# Patient Record
Sex: Female | Born: 1995 | Race: Black or African American | Hispanic: No | Marital: Single | Smoking: Former smoker
Health system: Southern US, Community
[De-identification: ages and names within clinical notes are randomized; demographics above are authoritative.]

## PROBLEM LIST (undated history)

## (undated) DIAGNOSIS — I10 Essential (primary) hypertension: Secondary | ICD-10-CM

## (undated) DIAGNOSIS — E282 Polycystic ovarian syndrome: Secondary | ICD-10-CM

## (undated) DIAGNOSIS — G43909 Migraine, unspecified, not intractable, without status migrainosus: Secondary | ICD-10-CM

## (undated) DIAGNOSIS — Z8619 Personal history of other infectious and parasitic diseases: Secondary | ICD-10-CM

## (undated) HISTORY — PX: OTHER SURGICAL HISTORY: SHX169

## (undated) HISTORY — DX: Migraine, unspecified, not intractable, without status migrainosus: G43.909

## (undated) HISTORY — PX: DENTAL SURGERY: SHX609

## (undated) HISTORY — PX: GANGLION CYST EXCISION: SHX1691

---
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
## 2014-08-13 NOTE — Consult Note (Signed)
PATIENT NAME:  Kari Morales, UMBAUGH MR#:  [id number] DATE OF BIRTH:  08/10/1995  DATE OF CONSULTATION:  07/24/2011  REFERRING PHYSICIAN:   CONSULTING PHYSICIAN:  Carmie End, MD  PRIMARY CARE PHYSICIAN: Evelene Croon, MD    CHIEF COMPLAINT: Abdominal pain.   BRIEF HISTORY: Merrill Deanda is a 19 year old girl seen in the Emergency Room with a several day history of abdominal pain. She has crampy upper abdominal pain without radiation associated with mild nausea, no vomiting, and mild diarrhea. On close questioning, the pain dates back multiple months and she's had intermittent episodes but the current situation is probably the worst. She has been evaluated in Dr. Carron Brazen office on several occasions. No laboratory values are available for our review. She was originally set up for an upper endoscopy at [facility] but that appointment was canceled as it was felt that she likely had a variety of gastroenteritis. The symptoms have returned. The patient and family are frustrated and they came to the Emergency Room for further evaluation this evening.   She denies any history of hepatitis, yellow jaundice, pancreatitis, peptic ulcer disease, gallbladder disease, or previous abdominal surgery. She has intermittent menstrual bleeding which, over the last several months, has been abnormal. She has not been evaluated by a gynecologist as yet. She has no other major medical problems, specifically denying cardiac disease, hypertension, or diabetes. She does have a distant family history of sickle cell disease. Her mother recently had gallbladder surgery.   MEDICATIONS: She takes no medications regularly.   ALLERGIES: She is allergic to shellfish.   SOCIAL HISTORY: She is a high [employer]. Denies any alcohol or tobacco abuse. This pain has interrupted her schoolwork and she has missed multiple days from school.   REVIEW OF SYSTEMS: 10 point review of systems otherwise  unremarkable.     PHYSICAL EXAMINATION:   GENERAL: She is mildly hypertensive at 145/105, heart rate 80 and regular. She is afebrile. Oxygen saturation is satisfactory.   HEENT: Unremarkable. She has no scleral icterus. There are no facial deformities with equally round pupils.   NECK: Supple without adenopathy and her trachea is midline.   CHEST: Clear with normal pulmonary excursion. No adventitious sounds.   CARDIAC: No murmurs or gallops to my ear and she is in normal sinus rhythm.   ABDOMEN: Mildly distended, soft, with generalized tenderness in the upper abdomen and lower abdomen but no point tenderness. No rebound. No guarding. No hernias. No masses noted.   EXTREMITIES: Full range of motion. Good distal pulses. No deformities.   PSYCHIATRIC: Unremarkable with normal affect and normal orientation. However, I do get a hint from her that she's frustrated with her parents over this presentation and would like more to be done this evening.   The patient underwent a CT scan in the Emergency Room which demonstrated slightly thickened proximal small bowel without any inflammatory changes and a mildly dilated appendix at 6 mm; however, the appendix did have air in it, does not appear to be any periappendiceal stranding and no inflammatory changes noted. She does have multiple intraabdominal lymph nodes noted.   Laboratory values were largely unremarkable. Her liver function studies are slightly abnormal with a bilirubin of 1.2, white blood cell count 6400, hemoglobin 16. I have independently reviewed her CT scan.   I do not see any indication for urgent intervention. The most likely diagnosis is gastroenteritis but in view of her multiple symptoms and the persistent nature of this problem she  would likely need an upper endoscopy and a pediatric gastroenterology evaluation. With the abnormal CT scan suggesting some proximal small bowel thickening, I think upper endoscopy may be of some  benefit. The family would like to have an outside referral to a specialist and we will help arrange that if possible. I do not see any indication for hospitalization this evening but did offer observation to the family. They declined and are willing to take her home. Will discharge her home on Vicodin and Phenergan to help with her symptoms.  ____________________________ Quentin Orealph L. Ely III, MD rle:drc D: 07/24/2011 02:34:57 ET T: 07/24/2011 10:17:07 ET JOB#: [id number]  cc: Quentin Orealph L. Ely III, MD, <Dictator> Meindert A. Lacie ScottsNiemeyer, MD Quentin OreALPH L ELY MD ELECTRONICALLY SIGNED 08/01/2011 7:22

## 2015-01-10 ENCOUNTER — Encounter: Payer: Self-pay | Admitting: Emergency Medicine

## 2015-01-10 ENCOUNTER — Emergency Department
Admission: EM | Admit: 2015-01-10 | Discharge: 2015-01-10 | Disposition: A | Discharge status: Home / Self Care | Payer: BLUE CROSS/BLUE SHIELD | Attending: Emergency Medicine | Admitting: Emergency Medicine

## 2015-01-10 DIAGNOSIS — Z3202 Encounter for pregnancy test, result negative: Secondary | ICD-10-CM | POA: Insufficient documentation

## 2015-01-10 DIAGNOSIS — N730 Acute parametritis and pelvic cellulitis: Secondary | ICD-10-CM

## 2015-01-10 DIAGNOSIS — R102 Pelvic and perineal pain: Secondary | ICD-10-CM | POA: Diagnosis present

## 2015-01-10 DIAGNOSIS — N739 Female pelvic inflammatory disease, unspecified: Secondary | ICD-10-CM | POA: Diagnosis not present

## 2015-01-10 HISTORY — DX: Polycystic ovarian syndrome: E28.2

## 2015-01-10 LAB — URINALYSIS COMPLETE WITH MICROSCOPIC (ARMC ONLY)
Bilirubin Urine: NEGATIVE
Glucose, UA: NEGATIVE mg/dL
HGB URINE DIPSTICK: NEGATIVE
Ketones, ur: NEGATIVE mg/dL
NITRITE: NEGATIVE
PH: 6 (ref 5.0–8.0)
PROTEIN: NEGATIVE mg/dL
SPECIFIC GRAVITY, URINE: 1.012 (ref 1.005–1.030)

## 2015-01-10 LAB — WET PREP, GENITAL
Clue Cells Wet Prep HPF POC: NONE SEEN
TRICH WET PREP: NONE SEEN
YEAST WET PREP: NONE SEEN

## 2015-01-10 LAB — CHLAMYDIA/NGC RT PCR (ARMC ONLY)
Chlamydia Tr: NOT DETECTED
N gonorrhoeae: NOT DETECTED

## 2015-01-10 MED ORDER — DOXYCYCLINE HYCLATE 100 MG PO CAPS: 100.0000 mg | ORAL_CAPSULE | Freq: Two times a day (BID) | ORAL | Status: DC

## 2015-01-10 MED ORDER — AZITHROMYCIN 250 MG PO TABS
1000.0000 mg | ORAL_TABLET | Freq: Once | ORAL | Status: AC
  Administered 2015-01-10: 1000 mg via ORAL
  Filled 2015-01-10: qty 4

## 2015-01-10 MED ORDER — IBUPROFEN 800 MG PO TABS
800.0000 mg | ORAL_TABLET | Freq: Once | ORAL | Status: AC
  Administered 2015-01-10: 800 mg via ORAL
  Filled 2015-01-10: qty 1

## 2015-01-10 MED ORDER — TRAMADOL HCL 50 MG PO TABS: 50.0000 mg | ORAL_TABLET | Freq: Four times a day (QID) | ORAL | Status: AC | PRN

## 2015-01-10 MED ORDER — METRONIDAZOLE 500 MG PO TABS: 500.0000 mg | ORAL_TABLET | Freq: Two times a day (BID) | ORAL | Status: DC

## 2015-01-10 MED ORDER — CEFTRIAXONE SODIUM 250 MG IJ SOLR
250.0000 mg | INTRAMUSCULAR | Status: DC
  Administered 2015-01-10: 250 mg via INTRAMUSCULAR
  Filled 2015-01-10: qty 250

## 2015-01-10 NOTE — ED Notes (Signed)
POC negative. Glucometer recording not available at this time to input into system.

## 2015-01-10 NOTE — Discharge Instructions (Signed)
Pelvic Inflammatory Disease °Pelvic inflammatory disease (PID) refers to an infection in some or all of the female organs. The infection can be in the uterus, ovaries, fallopian tubes, or the surrounding tissues in the pelvis. PID can cause abdominal or pelvic pain that comes on suddenly (acute pelvic pain). PID is a serious infection because it can lead to lasting (chronic) pelvic pain or the inability to have children (infertile).  °CAUSES  °The infection is often caused by the normal bacteria found in the vaginal tissues. PID may also be caused by an infection that is spread during sexual contact. PID can also occur following:  °· The birth of a baby.   °· A miscarriage.   °· An abortion.   °· Major pelvic surgery.   °· The use of an intrauterine device (IUD).   °· A sexual assault.   °RISK FACTORS °Certain factors can put a person at higher risk for PID, such as: °· Being younger than 25 years. °· Being sexually active at a young age. °· Using nonbarrier contraception. °· Having multiple sexual partners. °· Having sex with someone who has symptoms of a genital infection. °· Using oral contraception. °Other times, certain behaviors can increase the possibility of getting PID, such as: °· Having sex during your period. °· Using a vaginal douche. °· Having an intrauterine device (IUD) in place. °SYMPTOMS  °· Abdominal or pelvic pain.   °· Fever.   °· Chills.   °· Abnormal vaginal discharge. °· Abnormal uterine bleeding.   °· Unusual pain shortly after finishing your period. °DIAGNOSIS  °Your caregiver will choose some of the following methods to make a diagnosis, such as:  °· Performing a physical exam and history. A pelvic exam typically reveals a very tender uterus and surrounding pelvis.   °· Ordering laboratory tests including a pregnancy test, blood tests, and urine test.  °· Ordering cultures of the vagina and cervix to check for a sexually transmitted infection (STI). °· Performing an ultrasound.    °· Performing a laparoscopic procedure to look inside the pelvis.   °TREATMENT  °· Antibiotic medicines may be prescribed and taken by mouth.   °· Sexual partners may be treated when the infection is caused by a sexually transmitted disease (STD).   °· Hospitalization may be needed to give antibiotics intravenously. °· Surgery may be needed, but this is rare. °It may take weeks until you are completely well. If you are diagnosed with PID, you should also be checked for human immunodeficiency virus (HIV).   °HOME CARE INSTRUCTIONS  °· If given, take your antibiotics as directed. Finish the medicine even if you start to feel better.   °· Only take over-the-counter or prescription medicines for pain, discomfort, or fever as directed by your caregiver.   °· Do not have sexual intercourse until treatment is completed or as directed by your caregiver. If PID is confirmed, your recent sexual partner(s) will need treatment.   °· Keep your follow-up appointments. °SEEK MEDICAL CARE IF:  °· You have increased or abnormal vaginal discharge.   °· You need prescription medicine for your pain.   °· You vomit.   °· You cannot take your medicines.   °· Your partner has an STD.   °SEEK IMMEDIATE MEDICAL CARE IF:  °· You have a fever.   °· You have increased abdominal or pelvic pain.   °· You have chills.   °· You have pain when you urinate.   °· You are not better after 72 hours following treatment.   °MAKE SURE YOU:  °· Understand these instructions. °· Will watch your condition. °· Will get help right away if you are not doing well or get worse. °  Document Released: 04/07/2005 Document Revised: 08/02/2012 Document Reviewed: 04/03/2011 °Exit[employer]® Patient Information ©2015 [employer]. This information is not intended to replace advice given to you by your health care provider. Make sure you discuss any questions you have with your health care provider. ° °

## 2015-01-10 NOTE — ED Notes (Signed)
Patient to ER for c/o flank pain and pelvic pain bilaterally. States a couple days ago developed left flank pain, now worse on right. Has h/o PCOS. States her period was 5 days late. POC pregnancy test in triage today was negative. Reports frequent urination, but no dysuria.

## 2015-01-10 NOTE — ED Provider Notes (Signed)
[facility] Emergency Department Provider Note     Time seen: ----------------------------------------- 12:11 PM on 01/10/2015 -----------------------------------------    I have reviewed the triage vital signs and the nursing notes.   HISTORY  Chief Complaint Flank Pain and Pelvic Pain    HPI Kari Morales is a 19 y.o. female who presents ER for pelvic pain. Patient states it hurts bilaterally, she's had vaginal discharge. Also has right flank pain. Doesn't history of polycystic ovaries, states her period was 5 days late. Her triage (test was negative. Reports frequent urination but no pain.   Past Medical History  Diagnosis Date  . PCOS (polycystic ovarian syndrome)     There are no active problems to display for this patient.   Past Surgical History  Procedure Laterality Date  . Ganglion cyst excision Left     x2    Allergies Shrimp  Social History Social History  Substance Use Topics  . Smoking status: Never Smoker   . Smokeless tobacco: None  . Alcohol Use: No    Review of Systems Constitutional: Negative for fever. Eyes: Negative for visual changes. ENT: Negative for sore throat. Cardiovascular: Negative for chest pain. Respiratory: Negative for shortness of breath. Gastrointestinal: Negative for abdominal pain, vomiting and diarrhea. Genitourinary: positive for pelvic pain, vaginal discharge Musculoskeletal: positive for right-sided low back pain Skin: Negative for rash. Neurological: Negative for headaches, focal weakness or numbness.  10-point ROS otherwise negative.  ____________________________________________   PHYSICAL EXAM:  VITAL SIGNS: ED Triage Vitals  Enc Vitals Group     BP 01/10/15 1111 158/111 mmHg     Pulse Rate 01/10/15 1111 100     Resp 01/10/15 1111 20     Temp 01/10/15 1111 98.2 F (36.8 C)     Temp Source 01/10/15 1111 Oral     SpO2 01/10/15 1111 100 %     Weight 01/10/15 1111 183 lb  (83.008 kg)     Height 01/10/15 1111  (1.626 m)     Head Cir --      Peak Flow --      Pain Score 01/10/15 1130 6     Pain Loc --      Pain Edu? --      Excl. in GC? --     Constitutional: Alert and oriented. Well appearing and in no distress. Eyes: Conjunctivae are normal. PERRL. Normal extraocular movements. ENT   Head: Normocephalic and atraumatic.   Nose: No congestion/rhinnorhea.   Mouth/Throat: Mucous membranes are moist.   Neck: No stridor. Cardiovascular: Normal rate, regular rhythm. Normal and symmetric distal pulses are present in all extremities. No murmurs, rubs, or gallops. Respiratory: Normal respiratory effort without tachypnea nor retractions. Breath sounds are clear and equal bilaterally. No wheezes/rales/rhonchi. Gastrointestinal: mild suprapubic tenderness, no rebound or guarding. Normal bowel sounds Musculoskeletal: Nontender with normal range of motion in all extremities. No joint effusions.  No lower extremity tenderness nor edema. Neurologic:  Normal speech and language. No gross focal neurologic deficits are appreciated. Speech is normal. No gait instability. Skin:  Skin is warm, dry and intact. No rash noted. Psychiatric: Mood and affect are normal. Speech and behavior are normal. Patient exhibits appropriate insight and judgment. ____________________________________________  ED COURSE:  Pertinent labs & imaging results that were available during my care of the patient were reviewed by me and considered in my medical decision making (see chart for details). Patient's in no acute distress, will check pelvic exam and urinalysis.  Or cervical  motion tenderness with significant vaginal discharge that is white with a yellow tinge. ____________________________________________    LABS (pertinent positives/negatives)  Labs Reviewed  WET PREP, GENITAL - Abnormal; Notable for the following:    WBC, Wet Prep HPF POC MODERATE (*)    All other  components within normal limits  URINALYSIS COMPLETEWITH MICROSCOPIC ([facility] ONLY) - Abnormal; Notable for the following:    Color, Urine YELLOW (*)    APPearance HAZY (*)    Leukocytes, UA TRACE (*)    Bacteria, UA RARE (*)    Squamous Epithelial / LPF 6-30 (*)    All other components within normal limits  CHLAMYDIA/NGC RT PCR ([facility] ONLY)  POC URINE PREG, ED    ____________________________________________  FINAL ASSESSMENT AND PLAN  Pelvic pain, PID  Plan: Patient with labs as dictated above. Patient is advised her symptoms are consistent with PID. She has copious vaginal dischargeand was given Rocephin and Zithromax to cover her for STDs. We'll discharge with doxycycline and Flagyl. She also received Motrin for pain.   Emily Filbert, MD   Emily Filbert, MD 01/10/15 [id number]

## 2015-01-10 NOTE — ED Notes (Signed)
Pt informed to return if any life threatening symptoms occur.  

## 2016-04-15 ENCOUNTER — Emergency Department (HOSPITAL_COMMUNITY)
Admission: EM | Admit: 2016-04-15 | Discharge: 2016-04-15 | Disposition: A | Discharge status: Home / Self Care | Payer: BLUE CROSS/BLUE SHIELD | Attending: Emergency Medicine | Admitting: Emergency Medicine

## 2016-04-15 ENCOUNTER — Emergency Department
Admission: EM | Admit: 2016-04-15 | Discharge: 2016-04-15 | Disposition: A | Discharge status: Home / Self Care | Payer: BLUE CROSS/BLUE SHIELD | Attending: Emergency Medicine | Admitting: Emergency Medicine

## 2016-04-15 ENCOUNTER — Encounter (HOSPITAL_COMMUNITY): Payer: Self-pay | Admitting: Emergency Medicine

## 2016-04-15 DIAGNOSIS — Z79899 Other long term (current) drug therapy: Secondary | ICD-10-CM | POA: Insufficient documentation

## 2016-04-15 DIAGNOSIS — I10 Essential (primary) hypertension: Secondary | ICD-10-CM | POA: Insufficient documentation

## 2016-04-15 DIAGNOSIS — R1032 Left lower quadrant pain: Secondary | ICD-10-CM | POA: Insufficient documentation

## 2016-04-15 DIAGNOSIS — Z5321 Procedure and treatment not carried out due to patient leaving prior to being seen by health care provider: Secondary | ICD-10-CM | POA: Diagnosis not present

## 2016-04-15 DIAGNOSIS — K6289 Other specified diseases of anus and rectum: Secondary | ICD-10-CM | POA: Insufficient documentation

## 2016-04-15 DIAGNOSIS — R1031 Right lower quadrant pain: Secondary | ICD-10-CM | POA: Insufficient documentation

## 2016-04-15 DIAGNOSIS — R102 Pelvic and perineal pain: Secondary | ICD-10-CM

## 2016-04-15 DIAGNOSIS — R1030 Lower abdominal pain, unspecified: Secondary | ICD-10-CM | POA: Diagnosis present

## 2016-04-15 HISTORY — DX: Essential (primary) hypertension: I10

## 2016-04-15 LAB — CBC WITH DIFFERENTIAL/PLATELET
Basophils Absolute: 0 10*3/uL (ref 0.0–0.1)
Basophils Relative: 0 %
Eosinophils Absolute: 0.1 10*3/uL (ref 0.0–0.7)
Eosinophils Relative: 1 %
HCT: 41.6 % (ref 36.0–46.0)
Hemoglobin: 14.7 g/dL (ref 12.0–15.0)
Lymphocytes Relative: 28 %
Lymphs Abs: 2.5 10*3/uL (ref 0.7–4.0)
MCH: 27.3 pg (ref 26.0–34.0)
MCHC: 35.3 g/dL (ref 30.0–36.0)
MCV: 77.2 fL — ABNORMAL LOW (ref 78.0–100.0)
Monocytes Absolute: 0.7 10*3/uL (ref 0.1–1.0)
Monocytes Relative: 9 %
Neutro Abs: 5.4 10*3/uL (ref 1.7–7.7)
Neutrophils Relative %: 62 %
Platelets: 193 10*3/uL (ref 150–400)
RBC: 5.39 MIL/uL — ABNORMAL HIGH (ref 3.87–5.11)
RDW: 12.4 % (ref 11.5–15.5)
WBC: 8.7 10*3/uL (ref 4.0–10.5)

## 2016-04-15 LAB — URINALYSIS, ROUTINE W REFLEX MICROSCOPIC
Bilirubin Urine: NEGATIVE
Glucose, UA: NEGATIVE mg/dL
Hgb urine dipstick: NEGATIVE
Ketones, ur: NEGATIVE mg/dL
Leukocytes, UA: NEGATIVE
Nitrite: NEGATIVE
Protein, ur: NEGATIVE mg/dL
Specific Gravity, Urine: 1.011 (ref 1.005–1.030)
pH: 7 (ref 5.0–8.0)

## 2016-04-15 LAB — COMPREHENSIVE METABOLIC PANEL
ALT: 16 U/L (ref 14–54)
AST: 28 U/L (ref 15–41)
Albumin: 4.3 g/dL (ref 3.5–5.0)
Alkaline Phosphatase: 55 U/L (ref 38–126)
Anion gap: 12 (ref 5–15)
BUN: 9 mg/dL (ref 6–20)
CO2: 25 mmol/L (ref 22–32)
Calcium: 9.8 mg/dL (ref 8.9–10.3)
Chloride: 99 mmol/L — ABNORMAL LOW (ref 101–111)
Creatinine, Ser: 1.14 mg/dL — ABNORMAL HIGH (ref 0.44–1.00)
GFR calc Af Amer: >60 mL/min (ref >60)
GFR calc non Af Amer: >60 mL/min (ref >60)
Glucose, Bld: 93 mg/dL (ref 65–99)
Potassium: 3.4 mmol/L — ABNORMAL LOW (ref 3.5–5.1)
Sodium: 136 mmol/L (ref 135–145)
Total Bilirubin: 1 mg/dL (ref 0.3–1.2)
Total Protein: 7.6 g/dL (ref 6.5–8.1)

## 2016-04-15 LAB — LIPASE, BLOOD: Lipase: 22 U/L (ref 11–51)

## 2016-04-15 LAB — PREGNANCY, URINE: Preg Test, Ur: NEGATIVE

## 2016-04-15 MED ORDER — SODIUM CHLORIDE 0.9 % IV BOLUS (SEPSIS)
1000.0000 mL | Freq: Once | INTRAVENOUS | Status: AC
  Administered 2016-04-15: 1000 mL via INTRAVENOUS

## 2016-04-15 MED ORDER — DOXYCYCLINE HYCLATE 100 MG PO CAPS: 100.0000 mg | ORAL_CAPSULE | Freq: Two times a day (BID) | ORAL | 0 refills | Status: DC

## 2016-04-15 MED ORDER — DICYCLOMINE HCL 10 MG/ML IM SOLN
20.0000 mg | Freq: Once | INTRAMUSCULAR | Status: AC
  Administered 2016-04-15: 20 mg via INTRAMUSCULAR
  Filled 2016-04-15: qty 2

## 2016-04-15 MED ORDER — KETOROLAC TROMETHAMINE 15 MG/ML IJ SOLN
15.0000 mg | Freq: Once | INTRAMUSCULAR | Status: AC
  Administered 2016-04-15: 15 mg via INTRAVENOUS
  Filled 2016-04-15: qty 1

## 2016-04-15 MED ORDER — CEFTRIAXONE SODIUM 250 MG IJ SOLR
250.0000 mg | Freq: Once | INTRAMUSCULAR | Status: AC
  Administered 2016-04-15: 250 mg via INTRAMUSCULAR
  Filled 2016-04-15: qty 250

## 2016-04-15 MED ORDER — LIDOCAINE HCL (PF) 1 % IJ SOLN
INTRAMUSCULAR | Status: AC
  Administered 2016-04-15: 5 mL
  Filled 2016-04-15: qty 5

## 2016-04-15 MED ORDER — TRAMADOL HCL 50 MG PO TABS: 50.0000 mg | ORAL_TABLET | Freq: Four times a day (QID) | ORAL | 0 refills | Status: DC | PRN

## 2016-04-15 MED ORDER — AZITHROMYCIN 250 MG PO TABS
1000.0000 mg | ORAL_TABLET | Freq: Once | ORAL | Status: AC
  Administered 2016-04-15: 1000 mg via ORAL
  Filled 2016-04-15: qty 4

## 2016-04-15 NOTE — ED Notes (Signed)
Papers reviewed with patient and medications and she verbalizes understanding

## 2016-04-15 NOTE — ED Triage Notes (Signed)
Patient reports rectal p[ain onset 2 am this morning , denies injury , no bleeding or constipation .

## 2016-04-15 NOTE — ED Notes (Signed)
ED Provider at bedside. 

## 2016-04-15 NOTE — ED Provider Notes (Signed)
[facility]-EMERGENCY DEPT Provider Note   CSN: [id number] Arrival date & time: 04/15/16  0606     History   Chief Complaint Chief Complaint  Patient presents with  . Rectal Pain    HPI Kari Morales is a 20 y.o. female.  HPI   20yF with abdominal pain. Acute onset around 0200 today. Waxes and wanes. Suprapubic pain. Sometimes radiating pain to rectum. No appreciable exacerbating or relieving factors. Thought she may be constipation so she tried a laxative with having a BM or improvement of symptoms. Reports last BM yesterday. +dysuria. +vaginal discharge but this has been going on for at least weeks and she says was told be GYN that this was normal. NO n/v. No fever or chills. Denies past abdomina/palvic surgery.   Past Medical History:  Diagnosis Date  . Hypertension   . PCOS (polycystic ovarian syndrome)     There are no active problems to display for this patient.   Past Surgical History:  Procedure Laterality Date  . GANGLION CYST EXCISION Left    x2    OB History    No data available       Home Medications    Prior to Admission medications   Medication Sig Start Date End Date Taking? Authorizing Provider  doxycycline (VIBRAMYCIN) 100 MG capsule Take 1 capsule (100 mg total) by mouth 2 (two) times daily. 01/10/15   Emily FilbertJonathan E Williams, MD  metroNIDAZOLE (FLAGYL) 500 MG tablet Take 1 tablet (500 mg total) by mouth 2 (two) times daily. 01/10/15   Emily FilbertJonathan E Williams, MD    Family History No family history on file.  Social History Social History  Substance Use Topics  . Smoking status: Never Smoker  . Smokeless tobacco: Never Used  . Alcohol use No     Allergies   Shrimp [shellfish allergy]   Review of Systems Review of Systems  All systems reviewed and negative, other than as noted in HPI.  Physical Exam Updated Vital Signs BP 132/87 (BP Location: Right Arm)   Pulse 105   Temp 98.1 F (36.7 C) (Oral)   Resp 18   Ht 5\' 4"  (1.626 m)   Wt 173 lb  6 oz (78.6 kg)   LMP 02/24/2016 (Approximate)   SpO2 100%   BMI 29.76 kg/m   Physical Exam  Constitutional: She appears well-developed and well-nourished. No distress.  HENT:  Head: Normocephalic and atraumatic.  Eyes: Conjunctivae are normal. Right eye exhibits no discharge. Left eye exhibits no discharge.  Neck: Neck supple.  Cardiovascular: Normal rate, regular rhythm and normal heart sounds.  Exam reveals no gallop and no friction rub.   No murmur heard. Pulmonary/Chest: Effort normal and breath sounds normal. No respiratory distress.  Abdominal: Soft. She exhibits no distension. There is no tenderness.  Genitourinary:  Genitourinary Comments: Chaperone present. Normal external genitalia. No concerning lesions noted. Moderate white discharge. +CMT.    Musculoskeletal: She exhibits no edema or tenderness.  Neurological: She is alert.  Skin: Skin is warm and dry.  Psychiatric: She has a normal mood and affect. Her behavior is normal. Thought content normal.  Nursing note and vitals reviewed.    ED Treatments / Results  Labs (all labs ordered are listed, but only abnormal results are displayed) Labs Reviewed  COMPREHENSIVE METABOLIC PANEL - Abnormal; Notable for the following:       Result Value   Potassium 3.4 (*)    Chloride 99 (*)    Creatinine, Ser 1.14 (*)  All other components within normal limits  CBC WITH DIFFERENTIAL/PLATELET - Abnormal; Notable for the following:    RBC 5.39 (*)    MCV 77.2 (*)    All other components within normal limits  PREGNANCY, URINE  URINALYSIS, ROUTINE W REFLEX MICROSCOPIC  LIPASE, BLOOD  GC/CHLAMYDIA PROBE AMP ([place]    EKG  EKG Interpretation None       Radiology No results found.  Procedures Procedures (including critical care time)  Medications Ordered in ED Medications  sodium chloride 0.9 % bolus 1,000 mL (not administered)  dicyclomine (BENTYL) injection 20 mg (not administered)  ketorolac  (TORADOL) 15 MG/ML injection 15 mg (not administered)     Initial Impression / Assessment and Plan / ED Course  I have reviewed the triage vital signs and the nursing notes.  Pertinent labs & imaging results that were available during my care of the patient were reviewed by me and considered in my medical decision making (see chart for details).  Clinical Course     20yf with lower abdomina/pelvic pain. Concerning for possible PID. No peritonitis. Afebrile. Nontoxic. HD stable. I feel appropriate for outpt tx. Doubt TOA or abdominal surgical emergency.   Final Clinical Impressions(s) / ED Diagnoses   Final diagnoses:  Pelvic pain    New Prescriptions Discharge Medication List as of 04/15/2016 10:03 AM    START taking these medications   Details  traMADol (ULTRAM) 50 MG tablet Take 1 tablet (50 mg total) by mouth every 6 (six) hours as needed., Starting Tue 04/15/2016, Print         Raeford RazorStephen Hilarie Sinha, MD 04/16/16 [phone]

## 2016-04-15 NOTE — ED Notes (Signed)
Pt & SO noted leaving ED lobby 

## 2016-04-15 NOTE — ED Triage Notes (Addendum)
Pt presents to ED with c/o rectal pain and RLQ and LLQ suprapubic abdominal pain that started at 2am this morning. Pt reports (+) nausea, but no vomiting or diarrhea. Pt reports missing her LMP this month d/t "taking metformin". Pt denies change in bowel habits, but reports painful urination with increased frequency. Pt denies any h/x of hemorrhoids, denies rectal bleeding. Pt reports last BM was "like a day ago". Pt appears very uncomfortable, states she is unable to sit d/t discomfort, and is holding her lower abdomen with one hand and her rectum with the other..Marland Kitchen

## 2016-04-16 LAB — GC/CHLAMYDIA PROBE AMP (~~LOC~~) NOT AT ARMC
Chlamydia: NEGATIVE
Neisseria Gonorrhea: NEGATIVE

## 2016-05-08 ENCOUNTER — Emergency Department (HOSPITAL_COMMUNITY)
Admission: EM | Admit: 2016-05-08 | Discharge: 2016-05-09 | Disposition: A | Discharge status: Home / Self Care | Payer: BLUE CROSS/BLUE SHIELD | Attending: Emergency Medicine | Admitting: Emergency Medicine

## 2016-05-08 ENCOUNTER — Emergency Department (HOSPITAL_COMMUNITY): Payer: BLUE CROSS/BLUE SHIELD

## 2016-05-08 ENCOUNTER — Encounter (HOSPITAL_COMMUNITY): Payer: Self-pay

## 2016-05-08 DIAGNOSIS — Y929 Unspecified place or not applicable: Secondary | ICD-10-CM | POA: Insufficient documentation

## 2016-05-08 DIAGNOSIS — Z79899 Other long term (current) drug therapy: Secondary | ICD-10-CM | POA: Diagnosis not present

## 2016-05-08 DIAGNOSIS — I10 Essential (primary) hypertension: Secondary | ICD-10-CM | POA: Diagnosis not present

## 2016-05-08 DIAGNOSIS — E860 Dehydration: Secondary | ICD-10-CM

## 2016-05-08 DIAGNOSIS — Y999 Unspecified external cause status: Secondary | ICD-10-CM | POA: Diagnosis not present

## 2016-05-08 DIAGNOSIS — W19XXXA Unspecified fall, initial encounter: Secondary | ICD-10-CM

## 2016-05-08 DIAGNOSIS — Z711 Person with feared health complaint in whom no diagnosis is made: Secondary | ICD-10-CM

## 2016-05-08 DIAGNOSIS — W010XXA Fall on same level from slipping, tripping and stumbling without subsequent striking against object, initial encounter: Secondary | ICD-10-CM | POA: Insufficient documentation

## 2016-05-08 DIAGNOSIS — R7989 Other specified abnormal findings of blood chemistry: Secondary | ICD-10-CM

## 2016-05-08 DIAGNOSIS — R1030 Lower abdominal pain, unspecified: Secondary | ICD-10-CM

## 2016-05-08 DIAGNOSIS — M545 Low back pain: Secondary | ICD-10-CM | POA: Insufficient documentation

## 2016-05-08 DIAGNOSIS — Y9301 Activity, walking, marching and hiking: Secondary | ICD-10-CM | POA: Diagnosis not present

## 2016-05-08 DIAGNOSIS — Z202 Contact with and (suspected) exposure to infections with a predominantly sexual mode of transmission: Secondary | ICD-10-CM | POA: Diagnosis not present

## 2016-05-08 LAB — CBC WITH DIFFERENTIAL/PLATELET
BASOS ABS: 0 10*3/uL (ref 0.0–0.1)
Basophils Relative: 0 %
EOS PCT: 0 %
Eosinophils Absolute: 0 10*3/uL (ref 0.0–0.7)
HCT: 45.2 % (ref 36.0–46.0)
Hemoglobin: 15.8 g/dL — ABNORMAL HIGH (ref 12.0–15.0)
LYMPHS ABS: 1.7 10*3/uL (ref 0.7–4.0)
LYMPHS PCT: 17 %
MCH: 27.4 pg (ref 26.0–34.0)
MCHC: 35 g/dL (ref 30.0–36.0)
MCV: 78.5 fL (ref 78.0–100.0)
Monocytes Absolute: 0.7 10*3/uL (ref 0.1–1.0)
Monocytes Relative: 7 %
Neutro Abs: 7.6 10*3/uL (ref 1.7–7.7)
Neutrophils Relative %: 76 %
PLATELETS: 211 10*3/uL (ref 150–400)
RBC: 5.76 MIL/uL — ABNORMAL HIGH (ref 3.87–5.11)
RDW: 12.8 % (ref 11.5–15.5)
WBC: 10.1 10*3/uL (ref 4.0–10.5)

## 2016-05-08 LAB — BASIC METABOLIC PANEL
Anion gap: 18 — ABNORMAL HIGH (ref 5–15)
BUN: 9 mg/dL (ref 6–20)
CO2: 22 mmol/L (ref 22–32)
Calcium: 10.3 mg/dL (ref 8.9–10.3)
Chloride: 97 mmol/L — ABNORMAL LOW (ref 101–111)
Creatinine, Ser: 1.24 mg/dL — ABNORMAL HIGH (ref 0.44–1.00)
GFR calc Af Amer: >60 mL/min (ref >60)
GLUCOSE: 74 mg/dL (ref 65–99)
POTASSIUM: 3.3 mmol/L — AB (ref 3.5–5.1)
Sodium: 137 mmol/L (ref 135–145)

## 2016-05-08 LAB — POC URINE PREG, ED: PREG TEST UR: NEGATIVE

## 2016-05-08 MED ORDER — HYDROCODONE-ACETAMINOPHEN 5-325 MG PO TABS
2.0000 | ORAL_TABLET | Freq: Once | ORAL | Status: AC
  Administered 2016-05-08: 2 via ORAL
  Filled 2016-05-08: qty 2

## 2016-05-08 NOTE — ED Provider Notes (Signed)
[facility]-EMERGENCY DEPT Provider Note   CSN: [id number] Arrival date & time: 05/08/16  1938     History   Chief Complaint Chief Complaint  Patient presents with  . Fall  . Possible Pregnancy    HPI Kari Morales is a 21 y.o. female.  Kari Morales is a 21 y.o. Female who presents to the emergency department complaining of a slip and fall on the ice today with some back and neck pain as well as some vaginal bleeding and lower abdominal pain starting today. Patient reports she was walking in the snow when she slipped and fell landing on her back. She complains of some pain to her neck and back. She denies cigarette or loss of consciousness. She reports after the fall she began having some bilateral lower abdominal pain that is worse on her right side and vaginal bleeding. She is requesting a pregnancy test. She is sexually active and does not use protection.  She reports the pain in her abdomen came on suddenly. Last menstrual cycle was 04/19/2016. Patient tells me she is feeling anxious about being in the hospital. Patient denies fevers, and, chest pain, shortness of breath, syncope, lightheadedness, dizziness, numbness, tingling, weakness, changes to her vision, urinary symptoms, nausea, vomiting or diarrhea.   The history is provided by the patient. No language interpreter was used.  Fall  Associated symptoms include abdominal pain. Pertinent negatives include no chest pain, no headaches and no shortness of breath.  Possible Pregnancy  Associated symptoms include abdominal pain. Pertinent negatives include no chest pain, no headaches and no shortness of breath.    Past Medical History:  Diagnosis Date  . Hypertension   . PCOS (polycystic ovarian syndrome)     There are no active problems to display for this patient.   Past Surgical History:  Procedure Laterality Date  . GANGLION CYST EXCISION Left    x2    OB History    No data available       Home Medications     Prior to Admission medications   Medication Sig Start Date End Date Taking? Authorizing Provider  acetaminophen (TYLENOL) 325 MG tablet Take 2 tablets (650 mg total) by mouth every 6 (six) hours as needed for mild pain or moderate pain. 05/09/16   Everlene Farrier, PA-C  doxycycline (VIBRAMYCIN) 100 MG capsule Take 1 capsule (100 mg total) by mouth 2 (two) times daily. 04/15/16   Raeford Razor, MD  metroNIDAZOLE (FLAGYL) 500 MG tablet Take 1 tablet (500 mg total) by mouth 2 (two) times daily. 05/09/16   Everlene Farrier, PA-C  traMADol (ULTRAM) 50 MG tablet Take 1 tablet (50 mg total) by mouth every 6 (six) hours as needed. 04/15/16   Raeford Razor, MD    Family History No family history on file.  Social History Social History  Substance Use Topics  . Smoking status: Never Smoker  . Smokeless tobacco: Never Used  . Alcohol use No     Allergies   Shrimp [shellfish allergy]   Review of Systems Review of Systems  Constitutional: Negative for chills and fever.  HENT: Negative for congestion, nosebleeds and sore throat.   Eyes: Negative for visual disturbance.  Respiratory: Negative for cough and shortness of breath.   Cardiovascular: Negative for chest pain and palpitations.  Gastrointestinal: Positive for abdominal pain. Negative for blood in stool, diarrhea, nausea and vomiting.  Genitourinary: Positive for pelvic pain and vaginal bleeding. Negative for difficulty urinating, dysuria, frequency, hematuria, vaginal discharge and vaginal pain.  Musculoskeletal: Positive for back pain and neck pain.  Skin: Negative for rash.  Neurological: Negative for weakness, light-headedness, numbness and headaches.     Physical Exam Updated Vital Signs BP 134/85   Pulse 94   Temp 98.1 F (36.7 C)   Resp 18   Ht 5\' 4"  (1.626 m)   Wt 75.8 kg   LMP 04/19/2016 (Exact Date)   SpO2 100%   BMI 28.67 kg/m   Physical Exam  Constitutional: She is oriented to person, place, and time. She  appears well-developed and well-nourished. No distress.  Nontoxic appearing.  HENT:  Head: Normocephalic and atraumatic.  Right Ear: External ear normal.  Left Ear: External ear normal.  Mouth/Throat: Oropharynx is clear and moist.  No visible or palpated signs of head injury or trauma.  Eyes: Conjunctivae are normal. Pupils are equal, round, and reactive to light. Right eye exhibits no discharge. Left eye exhibits no discharge.  Neck: Normal range of motion. Neck supple. No JVD present. No tracheal deviation present.  No midline neck tenderness.  Cardiovascular: Normal rate, regular rhythm, normal heart sounds and intact distal pulses.  Exam reveals no gallop and no friction rub.   No murmur heard. Heart rate is 96.  Pulmonary/Chest: Effort normal and breath sounds normal. No stridor. No respiratory distress. She has no wheezes. She has no rales. She exhibits no tenderness.  Lungs clear to auscultation bilaterally. Symmetric chest expansion bilaterally.  Abdominal: Soft. Bowel sounds are normal. She exhibits no distension and no mass. There is tenderness. There is no rebound and no guarding.  Mild suprapubic and right lower quadrant abdominal tenderness to palpation. No peritoneal signs. No rebound tenderness. No CVA or flank tenderness.  Genitourinary:  Genitourinary Comments: Pelvic exam performed by me with female nurse tech chaperone. No external lesions or rashes noted. No vaginal bleeding noted. Cervix is closed. No cervical motion tenderness. Mild left adnexal tenderness. No right adnexal tenderness.  Musculoskeletal: Normal range of motion. She exhibits tenderness. She exhibits no edema or deformity.  Mild tenderness to her right low back musculature. No midline neck or back tenderness. No back erythema, deformity, ecchymosis or warmth. Good strength to her bilateral upper and lower extremities. Normal gait.  Lymphadenopathy:    She has no cervical adenopathy.  Neurological: She is  alert and oriented to person, place, and time. She displays normal reflexes. No cranial nerve deficit or sensory deficit. Coordination normal.  Normal gait. Bilateral patellar DTRs are intact.  Skin: Skin is warm and dry. Capillary refill takes less than 2 seconds. No rash noted. She is not diaphoretic. No erythema. No pallor.  Psychiatric: She has a normal mood and affect. Her behavior is normal.  Nursing note and vitals reviewed.    ED Treatments / Results  Labs (all labs ordered are listed, but only abnormal results are displayed) Labs Reviewed  WET PREP, GENITAL - Abnormal; Notable for the following:       Result Value   Clue Cells Wet Prep HPF POC PRESENT (*)    WBC, Wet Prep HPF POC MANY (*)    All other components within normal limits  BASIC METABOLIC PANEL - Abnormal; Notable for the following:    Potassium 3.3 (*)    Chloride 97 (*)    Creatinine, Ser 1.24 (*)    Anion gap 18 (*)    All other components within normal limits  CBC WITH DIFFERENTIAL/PLATELET - Abnormal; Notable for the following:    RBC 5.76 (*)  Hemoglobin 15.8 (*)    All other components within normal limits  POC URINE PREG, ED  GC/CHLAMYDIA PROBE AMP ([place]) NOT AT [facility]    EKG  EKG Interpretation None       Radiology US Transvaginal Non-ob  Result Date: 05/08/2016 CLINICAL DATA:  Lower abdominal/ pelvic pain.  Vaginal bleeding. EXAM: TRANSABDOMINAL AND TRANSVAGINAL ULTRASOUND OF PELVIS DOPPLER ULTRASOUND OF OVARIES TECHNIQUE: Both transabdominal and transvaginal ultrasound examinations of the pelvis were performed. Transabdominal technique was performed for global imaging of the pelvis including uterus, ovaries, adnexal regions, and pelvic cul-de-sac. It was necessary to proceed with endovaginal exam following the transabdominal exam to visualize the right and left ovary. Color and duplex Doppler ultrasound was utilized to evaluate blood flow to the ovaries. COMPARISON:  None. FINDINGS:  Uterus Measurements: 7.2 x 3.9 x 4.9 cm when measured transabdominally. No fibroids or other mass visualized. Endometrium Thickness: 3 mm.  No focal abnormality visualized. Right ovary Measurements: 4.5 x 1.9 x 3.5 cm. Increased number of follicles that are oriented peripherally. Mildly echogenic ovarian stroma. Normal blood flow. Left ovary Measurements: 3.3 x 2.5 x 3.1 cm. Increased number of follicles that are oriented peripherally, relatively uniform in size. There is a complex cyst measuring 1.3 x 1.2 x 1.0 cm with internal septation and lacy internal echoes. Normal blood flow to the ovary. Pulsed Doppler evaluation of both ovaries demonstrates normal low-resistance arterial and venous waveforms. Other findings No abnormal free fluid.  Trace free fluid is physiologic. IMPRESSION: 1. Normal blood flow to both ovaries without torsion. 2. Sonographic findings support the patient's diagnosis of polycystic ovarian syndrome with peripherally oriented follicles in both ovaries. Probable small hemorrhagic cyst in the left ovary, no dedicated imaging follow-up is needed. 3. Normal sonographic appearance of the uterus. Electronically Signed   By: Rubye Oaks M.D.   On: 05/08/2016 23:55   US Pelvis Complete  Result Date: 05/08/2016 CLINICAL DATA:  Lower abdominal/ pelvic pain.  Vaginal bleeding. EXAM: TRANSABDOMINAL AND TRANSVAGINAL ULTRASOUND OF PELVIS DOPPLER ULTRASOUND OF OVARIES TECHNIQUE: Both transabdominal and transvaginal ultrasound examinations of the pelvis were performed. Transabdominal technique was performed for global imaging of the pelvis including uterus, ovaries, adnexal regions, and pelvic cul-de-sac. It was necessary to proceed with endovaginal exam following the transabdominal exam to visualize the right and left ovary. Color and duplex Doppler ultrasound was utilized to evaluate blood flow to the ovaries. COMPARISON:  None. FINDINGS: Uterus Measurements: 7.2 x 3.9 x 4.9 cm when measured  transabdominally. No fibroids or other mass visualized. Endometrium Thickness: 3 mm.  No focal abnormality visualized. Right ovary Measurements: 4.5 x 1.9 x 3.5 cm. Increased number of follicles that are oriented peripherally. Mildly echogenic ovarian stroma. Normal blood flow. Left ovary Measurements: 3.3 x 2.5 x 3.1 cm. Increased number of follicles that are oriented peripherally, relatively uniform in size. There is a complex cyst measuring 1.3 x 1.2 x 1.0 cm with internal septation and lacy internal echoes. Normal blood flow to the ovary. Pulsed Doppler evaluation of both ovaries demonstrates normal low-resistance arterial and venous waveforms. Other findings No abnormal free fluid.  Trace free fluid is physiologic. IMPRESSION: 1. Normal blood flow to both ovaries without torsion. 2. Sonographic findings support the patient's diagnosis of polycystic ovarian syndrome with peripherally oriented follicles in both ovaries. Probable small hemorrhagic cyst in the left ovary, no dedicated imaging follow-up is needed. 3. Normal sonographic appearance of the uterus. Electronically Signed   By: Lujean Rave.D.  On: 05/08/2016 23:55   [place] Art/ven Flow Abd Pelv Doppler  Result Date: 05/08/2016 CLINICAL DATA:  Lower abdominal/ pelvic pain.  Vaginal bleeding. EXAM: TRANSABDOMINAL AND TRANSVAGINAL ULTRASOUND OF PELVIS DOPPLER ULTRASOUND OF OVARIES TECHNIQUE: Both transabdominal and transvaginal ultrasound examinations of the pelvis were performed. Transabdominal technique was performed for global imaging of the pelvis including uterus, ovaries, adnexal regions, and pelvic cul-de-sac. It was necessary to proceed with endovaginal exam following the transabdominal exam to visualize the right and left ovary. Color and duplex Doppler ultrasound was utilized to evaluate blood flow to the ovaries. COMPARISON:  None. FINDINGS: Uterus Measurements: 7.2 x 3.9 x 4.9 cm when measured transabdominally. No fibroids or other mass  visualized. Endometrium Thickness: 3 mm.  No focal abnormality visualized. Right ovary Measurements: 4.5 x 1.9 x 3.5 cm. Increased number of follicles that are oriented peripherally. Mildly echogenic ovarian stroma. Normal blood flow. Left ovary Measurements: 3.3 x 2.5 x 3.1 cm. Increased number of follicles that are oriented peripherally, relatively uniform in size. There is a complex cyst measuring 1.3 x 1.2 x 1.0 cm with internal septation and lacy internal echoes. Normal blood flow to the ovary. Pulsed Doppler evaluation of both ovaries demonstrates normal low-resistance arterial and venous waveforms. Other findings No abnormal free fluid.  Trace free fluid is physiologic. IMPRESSION: 1. Normal blood flow to both ovaries without torsion. 2. Sonographic findings support the patient's diagnosis of polycystic ovarian syndrome with peripherally oriented follicles in both ovaries. Probable small hemorrhagic cyst in the left ovary, no dedicated imaging follow-up is needed. 3. Normal sonographic appearance of the uterus. Electronically Signed   By: Rubye Oaks M.D.   On: 05/08/2016 23:55    Procedures Procedures (including critical care time)  Medications Ordered in ED Medications  sodium chloride 0.9 % bolus 1,000 mL (not administered)  cefTRIAXone (ROCEPHIN) injection 250 mg (not administered)  azithromycin (ZITHROMAX) tablet 1,000 mg (not administered)  HYDROcodone-acetaminophen (NORCO/VICODIN) 5-325 MG per tablet 2 tablet (2 tablets Oral Given 05/08/16 2301)     Initial Impression / Assessment and Plan / ED Course  I have reviewed the triage vital signs and the nursing notes.  Pertinent labs & imaging results that were available during my care of the patient were reviewed by me and considered in my medical decision making (see chart for details).    This is a 21 y.o. Female who presents to the emergency department complaining of a slip and fall on the ice today with some back and neck pain  as well as some vaginal bleeding and lower abdominal pain starting today. Patient reports she was walking in the snow when she slipped and fell landing on her back. She complains of some pain to her neck and back. She denies cigarette or loss of consciousness. She reports after the fall she began having some bilateral lower abdominal pain that is worse on her right side and vaginal bleeding. She is requesting a pregnancy test. She is sexually active and does not use protection.  She reports the pain in her abdomen came on suddenly. Last menstrual cycle was 04/19/2016. Patient tells me she is feeling anxious about being in the hospital. On exam the patient is afebrile nontoxic appearing. She does appear slightly anxious. Heart rate is 96. Abdomen is soft and she has mild suprapubic and right lower quadrant tenderness to palpation on exam. On pelvic exam she has some mild left sided tenderness to palpation. No vaginal bleeding noted. No cervical motion tenderness. No peritoneal signs.  No midline neck or back tenderness. No focal neurological deficits. I see no need to do any imaging at this time based on exam. Patient agrees. Pregnancy test is negative. CBC is a marked for hemoglobin of 15.8. Normal white count. BMP is more for creatinine of 1.24. This is an increase from her baseline. Patient tells me she is only had 2 cups of cranberry juice today. She tells me she does not drink very much water. Patient's blood work appears to indicate that she is dehydrated. Patient refused IV fluid bolus. She did consume 4 cups of water and 1 cup of Sprite in the emergency department with my encouragement. I encouraged her to drink a glasses of water a day. Will have her follow-up with her primary care doctor to have her creatinine rechecked next week. Pelvic ultrasound shows no evidence of torsion. It does show PCOS, which the patient has been previously diagnosed with. Wet prep shows clue cells and many white blood cells.  Will cover for gonorrhea and chlamydia with Rocephin and azithromycin. Will discharge with Flagyl for bacterial vaginosis. Encouraged to have all partners to be tested for STDs as well. I encouraged follow-up with her OB/GYN. I advised the patient to follow-up with their primary care provider this week. I advised the patient to return to the emergency department with new or worsening symptoms or new concerns. The patient verbalized understanding and agreement with plan.    This patient was discussed with Dr. Mora Bellman who agrees with assessment and plan.   Final Clinical Impressions(s) / ED Diagnoses   Final diagnoses:  Lower abdominal pain  Acute bilateral low back pain, with sciatica presence unspecified  Concern about STD in female without diagnosis  Fall, initial encounter  Dehydration  Elevated serum creatinine    New Prescriptions New Prescriptions   ACETAMINOPHEN (TYLENOL) 325 MG TABLET    Take 2 tablets (650 mg total) by mouth every 6 (six) hours as needed for mild pain or moderate pain.   METRONIDAZOLE (FLAGYL) 500 MG TABLET    Take 1 tablet (500 mg total) by mouth 2 (two) times daily.     Everlene Farrier, PA-C 05/09/16 0128    Tomasita Crumble, MD 05/09/16 [phone]

## 2016-05-08 NOTE — ED Notes (Signed)
Patient transported to Ultrasound 

## 2016-05-08 NOTE — ED Triage Notes (Signed)
Pt states she slipped on the ice an fell about an hour ago; pt also states she started bleeding a few minutes after falling; pt states she was told by PCP she needs to be checked for possible pregnancy; pt c/o lower back pain at 8/101 on arrival. Pt a&ox 4 on arrival.

## 2016-05-09 LAB — WET PREP, GENITAL
SPERM: NONE SEEN
TRICH WET PREP: NONE SEEN
Yeast Wet Prep HPF POC: NONE SEEN

## 2016-05-09 LAB — GC/CHLAMYDIA PROBE AMP (~~LOC~~) NOT AT ARMC
CHLAMYDIA, DNA PROBE: NEGATIVE
Neisseria Gonorrhea: NEGATIVE

## 2016-05-09 MED ORDER — CEFTRIAXONE SODIUM 250 MG IJ SOLR
250.0000 mg | Freq: Once | INTRAMUSCULAR | Status: AC
  Administered 2016-05-09: 250 mg via INTRAMUSCULAR
  Filled 2016-05-09: qty 250

## 2016-05-09 MED ORDER — ACETAMINOPHEN 325 MG PO TABS: 650.0000 mg | ORAL_TABLET | Freq: Four times a day (QID) | ORAL | 0 refills | Status: DC | PRN

## 2016-05-09 MED ORDER — SODIUM CHLORIDE 0.9 % IV BOLUS (SEPSIS): 1000.0000 mL | Freq: Once | INTRAVENOUS | Status: DC

## 2016-05-09 MED ORDER — AZITHROMYCIN 250 MG PO TABS
1000.0000 mg | ORAL_TABLET | Freq: Once | ORAL | Status: AC
  Administered 2016-05-09: 1000 mg via ORAL
  Filled 2016-05-09: qty 4

## 2016-05-09 MED ORDER — METRONIDAZOLE 500 MG PO TABS: 500.0000 mg | ORAL_TABLET | Freq: Two times a day (BID) | ORAL | 0 refills | Status: DC

## 2016-05-09 MED ORDER — LIDOCAINE HCL (PF) 1 % IJ SOLN
INTRAMUSCULAR | Status: AC
  Administered 2016-05-09: 02:00:00
  Filled 2016-05-09: qty 5

## 2016-05-09 NOTE — ED Notes (Signed)
This RN went to start an IV for fluid bolus and pt refused. Pt wishes to "just drink lots of water". PA made aware. Will await further orders.

## 2017-12-08 ENCOUNTER — Encounter: Payer: Self-pay | Admitting: Emergency Medicine

## 2017-12-08 ENCOUNTER — Emergency Department: Payer: BLUE CROSS/BLUE SHIELD

## 2017-12-08 ENCOUNTER — Emergency Department
Admission: EM | Admit: 2017-12-08 | Discharge: 2017-12-08 | Disposition: A | Discharge status: Home / Self Care | Payer: BLUE CROSS/BLUE SHIELD | Attending: Emergency Medicine | Admitting: Emergency Medicine

## 2017-12-08 DIAGNOSIS — N3090 Cystitis, unspecified without hematuria: Secondary | ICD-10-CM | POA: Insufficient documentation

## 2017-12-08 DIAGNOSIS — A749 Chlamydial infection, unspecified: Secondary | ICD-10-CM | POA: Diagnosis not present

## 2017-12-08 DIAGNOSIS — Z79899 Other long term (current) drug therapy: Secondary | ICD-10-CM | POA: Diagnosis not present

## 2017-12-08 DIAGNOSIS — R102 Pelvic and perineal pain: Secondary | ICD-10-CM

## 2017-12-08 DIAGNOSIS — N760 Acute vaginitis: Secondary | ICD-10-CM | POA: Diagnosis not present

## 2017-12-08 DIAGNOSIS — E282 Polycystic ovarian syndrome: Secondary | ICD-10-CM | POA: Insufficient documentation

## 2017-12-08 DIAGNOSIS — I10 Essential (primary) hypertension: Secondary | ICD-10-CM | POA: Diagnosis not present

## 2017-12-08 DIAGNOSIS — B9689 Other specified bacterial agents as the cause of diseases classified elsewhere: Secondary | ICD-10-CM | POA: Diagnosis not present

## 2017-12-08 DIAGNOSIS — R1030 Lower abdominal pain, unspecified: Secondary | ICD-10-CM | POA: Diagnosis present

## 2017-12-08 LAB — URINALYSIS, COMPLETE (UACMP) WITH MICROSCOPIC
Bilirubin Urine: NEGATIVE
GLUCOSE, UA: NEGATIVE mg/dL
HGB URINE DIPSTICK: NEGATIVE
Ketones, ur: NEGATIVE mg/dL
NITRITE: NEGATIVE
Protein, ur: NEGATIVE mg/dL
SPECIFIC GRAVITY, URINE: 1.012 (ref 1.005–1.030)
pH: 7 (ref 5.0–8.0)

## 2017-12-08 LAB — CBC
HEMATOCRIT: 43.8 % (ref 35.0–47.0)
Hemoglobin: 14.8 g/dL (ref 12.0–16.0)
MCH: 28.6 pg (ref 26.0–34.0)
MCHC: 33.7 g/dL (ref 32.0–36.0)
MCV: 84.8 fL (ref 80.0–100.0)
Platelets: 163 10*3/uL (ref 150–440)
RBC: 5.16 MIL/uL (ref 3.80–5.20)
RDW: 14.6 % — AB (ref 11.5–14.5)
WBC: 9.4 10*3/uL (ref 3.6–11.0)

## 2017-12-08 LAB — COMPREHENSIVE METABOLIC PANEL
ALT: 14 U/L (ref 0–44)
AST: 21 U/L (ref 15–41)
Albumin: 4 g/dL (ref 3.5–5.0)
Alkaline Phosphatase: 67 U/L (ref 38–126)
Anion gap: 7 (ref 5–15)
BILIRUBIN TOTAL: 1 mg/dL (ref 0.3–1.2)
BUN: 7 mg/dL (ref 6–20)
CO2: 26 mmol/L (ref 22–32)
CREATININE: 0.99 mg/dL (ref 0.44–1.00)
Calcium: 8.9 mg/dL (ref 8.9–10.3)
Chloride: 107 mmol/L (ref 98–111)
GFR calc Af Amer: >60 mL/min (ref >60)
Glucose, Bld: 84 mg/dL (ref 70–99)
POTASSIUM: 3.5 mmol/L (ref 3.5–5.1)
Sodium: 140 mmol/L (ref 135–145)
TOTAL PROTEIN: 7.3 g/dL (ref 6.5–8.1)

## 2017-12-08 LAB — CHLAMYDIA/NGC RT PCR (ARMC ONLY)
CHLAMYDIA TR: DETECTED — AB
N GONORRHOEAE: NOT DETECTED

## 2017-12-08 LAB — WET PREP, GENITAL
SPERM: NONE SEEN
Trich, Wet Prep: NONE SEEN
Yeast Wet Prep HPF POC: NONE SEEN

## 2017-12-08 LAB — POCT PREGNANCY, URINE: Preg Test, Ur: NEGATIVE

## 2017-12-08 MED ORDER — CEPHALEXIN 500 MG PO CAPS: 500.0000 mg | ORAL_CAPSULE | Freq: Two times a day (BID) | ORAL | 0 refills | Status: AC

## 2017-12-08 MED ORDER — METRONIDAZOLE 500 MG PO TABS: 500.0000 mg | ORAL_TABLET | Freq: Two times a day (BID) | ORAL | 0 refills | Status: AC

## 2017-12-08 MED ORDER — CEFTRIAXONE SODIUM 250 MG IJ SOLR
INTRAMUSCULAR | Status: AC
  Filled 2017-12-08: qty 250

## 2017-12-08 MED ORDER — AZITHROMYCIN 500 MG PO TABS
ORAL_TABLET | ORAL | Status: AC
  Filled 2017-12-08: qty 1

## 2017-12-08 MED ORDER — LIDOCAINE HCL (PF) 1 % IJ SOLN
INTRAMUSCULAR | Status: AC
  Administered 2017-12-08: 0.9 mL
  Filled 2017-12-08: qty 5

## 2017-12-08 MED ORDER — CEFTRIAXONE SODIUM 250 MG IJ SOLR
250.0000 mg | Freq: Once | INTRAMUSCULAR | Status: AC
  Administered 2017-12-08: 250 mg via INTRAMUSCULAR

## 2017-12-08 MED ORDER — AZITHROMYCIN 500 MG PO TABS
1000.0000 mg | ORAL_TABLET | Freq: Once | ORAL | Status: AC
  Administered 2017-12-08: 1000 mg via ORAL

## 2017-12-08 NOTE — ED Notes (Signed)
Pelvic exam completed. Tolerated well.  pcr and wet prep to lab.

## 2017-12-08 NOTE — ED Provider Notes (Signed)
[facility] Emergency Department Provider Note  ____________________________________________  Time seen: Approximately 11:31 AM  I have reviewed the triage vital signs and the nursing notes.   HISTORY  Chief Complaint Abdominal Pain and Vaginal Discharge    HPI Kari Morales is a 22 y.o. female that presents to the emergency department for evaluation of low abdominal pressure for 4 days.  She has had some nausea but no vomiting.  Patient states that she has had some clear and white vaginal discharge that doesn't seem out of the ordinary.  She has one sexual partner.  No fever or chills.  No urinary symptoms.  Past Medical History:  Diagnosis Date  . Hypertension   . PCOS (polycystic ovarian syndrome)     There are no active problems to display for this patient.   Past Surgical History:  Procedure Laterality Date  . GANGLION CYST EXCISION Left    x2    Prior to Admission medications   Medication Sig Start Date End Date Taking? Authorizing Provider  acetaminophen (TYLENOL) 325 MG tablet Take 2 tablets (650 mg total) by mouth every 6 (six) hours as needed for mild pain or moderate pain. 05/09/16   Everlene Farrieransie, William, PA-C  cephALEXin (KEFLEX) 500 MG capsule Take 1 capsule (500 mg total) by mouth 2 (two) times daily for 10 days. 12/08/17 12/18/17  Enid DerryWagner, Diera Wirkkala, PA-C  doxycycline (VIBRAMYCIN) 100 MG capsule Take 1 capsule (100 mg total) by mouth 2 (two) times daily. 04/15/16   Raeford RazorKohut, Stephen, MD  metroNIDAZOLE (FLAGYL) 500 MG tablet Take 1 tablet (500 mg total) by mouth 2 (two) times daily for 7 days. 12/08/17 12/15/17  Enid DerryWagner, Darneshia Demary, PA-C  traMADol (ULTRAM) 50 MG tablet Take 1 tablet (50 mg total) by mouth every 6 (six) hours as needed. 04/15/16   Raeford RazorKohut, Stephen, MD    Allergies Shrimp [shellfish allergy]  No family history on file.  Social History Social History   Tobacco Use  . Smoking status: Never Smoker  . Smokeless tobacco: Never Used   Substance Use Topics  . Alcohol use: No  . Drug use: Not on file     Review of Systems  Constitutional: No fever/chills Cardiovascular: No chest pain. Respiratory: No SOB. Gastrointestinal: No vomiting.  Genitourinary: Negative for dysuria. Musculoskeletal: Negative for musculoskeletal pain. Skin: Negative for rash, abrasions, lacerations, ecchymosis.   ____________________________________________   PHYSICAL EXAM:  VITAL SIGNS: ED Triage Vitals  Enc Vitals Group     BP 12/08/17 1101 (!) 151/95     Pulse Rate 12/08/17 1101 73     Resp 12/08/17 1101 20     Temp 12/08/17 1101 98.2 F (36.8 C)     Temp Source 12/08/17 1101 Oral     SpO2 12/08/17 1101 100 %     Weight 12/08/17 1102 175 lb (79.4 kg)     Height 12/08/17 1102 5\' 4"  (1.626 m)     Head Circumference --      Peak Flow --      Pain Score 12/08/17 1102 5     Pain Loc --      Pain Edu? --      Excl. in GC? --      Constitutional: Alert and oriented. Well appearing and in no acute distress. Eyes: Conjunctivae are normal. PERRL. EOMI. Head: Atraumatic. ENT:      Ears:      Nose: No congestion/rhinnorhea.      Mouth/Throat: Mucous membranes are moist.  Neck: No stridor.  Cardiovascular: Normal rate, regular rhythm.  Good peripheral circulation. Respiratory: Normal respiratory effort without tachypnea or retractions. Lungs CTAB. Good air entry to the bases with no decreased or absent breath sounds. Gastrointestinal: Bowel sounds 4 quadrants.  Mild central suprapubic tenderness to palpation. No guarding or rigidity. No palpable masses. No distention. No CVA tenderness. Musculoskeletal: Full range of motion to all extremities. No gross deformities appreciated. Genitourinary: RN present for pelvic exam.  No external rashes or lesions.  Yellow vaginal discharge.  No cervical motion tenderness. Neurologic:  Normal speech and language. No gross focal neurologic deficits are appreciated.  Skin:  Skin is warm, dry  and intact. No rash noted. Psychiatric: Mood and affect are normal. Speech and behavior are normal. Patient exhibits appropriate insight and judgement.   ____________________________________________   LABS (all labs ordered are listed, but only abnormal results are displayed)  Labs Reviewed  WET PREP, GENITAL - Abnormal; Notable for the following components:      Result Value   Clue Cells Wet Prep HPF POC PRESENT (*)    WBC, Wet Prep HPF POC FEW (*)    All other components within normal limits  CHLAMYDIA/NGC RT PCR ([facility] ONLY) - Abnormal; Notable for the following components:   Chlamydia Tr DETECTED (*)    All other components within normal limits  CBC - Abnormal; Notable for the following components:   RDW 14.6 (*)    All other components within normal limits  URINALYSIS, COMPLETE (UACMP) WITH MICROSCOPIC - Abnormal; Notable for the following components:   Color, Urine YELLOW (*)    APPearance HAZY (*)    Leukocytes, UA SMALL (*)    Bacteria, UA RARE (*)    All other components within normal limits  COMPREHENSIVE METABOLIC PANEL  POC URINE PREG, ED  POCT PREGNANCY, URINE   ____________________________________________  EKG   ____________________________________________  RADIOLOGY   US Pelvic Complete With Transvaginal  Result Date: 12/08/2017 CLINICAL DATA:  Pelvic pain EXAM: TRANSABDOMINAL AND TRANSVAGINAL ULTRASOUND OF PELVIS TECHNIQUE: Study was performed transabdominally to optimize pelvic field of view evaluation and transvaginally to optimize internal visceral architecture evaluation. COMPARISON:  May 08, 2016 FINDINGS: Uterus Measurements: 8.5 x 3.6 x 4.2 cm. No fibroids or other mass visualized. Endometrium Thickness: 6 mm. No abnormality appreciable. Endometrial contour is smooth. Right ovary Measurements: 5.1 x 3.4 x 3.3 cm. Normal appearance/no adnexal mass. There is a dominant cyst in the right ovary measuring 3.5 x 2.5 x 3.5 cm. Several small  follicles noted in right ovary. Left ovary Measurements: 5.0 x 1.9 x 2.3 cm. Normal appearance/no adnexal mass. Multiple small follicles are noted throughout the left ovary, similar prior study. Other findings No abnormal free fluid. IMPRESSION: 1. Dominant cyst right ovary measuring 3.5 x 2.5 x 3.5 cm. This is almost certainly benign, and no specific imaging follow up is recommended according to the Society of Radiologists in Ultrasound2010 Consensus Conference Statement (D Lenis Noon et al. Management of Asymptomatic Ovarian and Other Adnexal Cysts Imaged at [place]: Society of Radiologists in Ultrasound Consensus Conference Statement 2010. Radiology 256 (Sept 2010): 943-954.). 2. Multiple ovarian follicles again noted. Patient by report has received a diagnosis of polycystic ovarian syndrome previously. In appearance similar to appearance on prior ultrasound in 2018. 3.  Study otherwise unremarkable. Electronically Signed   By: Bretta Bang III M.D.   On: 12/08/2017 14:29    ____________________________________________    PROCEDURES  Procedure(s) performed:    Procedures    Medications  azithromycin (ZITHROMAX) 500  MG tablet (has no administration in time range)  cefTRIAXone (ROCEPHIN) 250 MG injection (has no administration in time range)  cefTRIAXone (ROCEPHIN) injection 250 mg (250 mg Intramuscular Given 12/08/17 1501)  azithromycin (ZITHROMAX) tablet 1,000 mg (1,000 mg Oral Given 12/08/17 1502)  lidocaine (PF) (XYLOCAINE) 1 % injection (0.9 mLs  Given 12/08/17 1502)     ____________________________________________   INITIAL IMPRESSION / ASSESSMENT AND PLAN / ED COURSE  Pertinent labs & imaging results that were available during my care of the patient were reviewed by me and considered in my medical decision making (see chart for details).  Review of the [place] CSRS was performed in accordance of the [facility] prior to dispensing any controlled drugs.     Patient's diagnosis is consistent  with bacterial vaginosis, chlamydia, cystitis, polycystic ovary syndrome.  Vital signs and exam are reassuring.  Wet prep consistent with bacterial vaginosis.  Chlamydia test is positive.  Patient was educated that all partners need to be treated.  Ultrasound results were discussed with patient.  IM ceftriaxone and oral azithromycin were given.  Patient will be discharged home with prescriptions for Flagyl and Keflex. Patient is to follow up with PCP as directed. Patient is given ED precautions to return to the ED for any worsening or new symptoms.     ____________________________________________  FINAL CLINICAL IMPRESSION(S) / ED DIAGNOSES  Final diagnoses:  Chlamydia  Polycystic ovary syndrome  Bacterial vaginosis  Cystitis      NEW MEDICATIONS STARTED DURING THIS VISIT:  ED Discharge Orders         Ordered    cephALEXin (KEFLEX) 500 MG capsule  2 times daily     12/08/17 1458    metroNIDAZOLE (FLAGYL) 500 MG tablet  2 times daily     12/08/17 1458              This chart was dictated using voice recognition software/Dragon. Despite best efforts to proofread, errors can occur which can change the meaning. Any change was purely unintentional.    Enid DerryWagner, [place] Condon, PA-C 12/08/17 1553    Arnaldo NatalMalinda, Paul F, MD 12/15/17 [phone]

## 2017-12-08 NOTE — ED Triage Notes (Signed)
Pt reports some pressure to her lower abd area intermittently for a few days and some vaginal discharge. Pt denies urinary sx's.

## 2017-12-25 DIAGNOSIS — G43909 Migraine, unspecified, not intractable, without status migrainosus: Secondary | ICD-10-CM | POA: Insufficient documentation

## 2017-12-25 LAB — HM HIV SCREENING LAB: HM HIV Screening: NEGATIVE

## 2017-12-25 LAB — HM PAP SMEAR: HM Pap smear: NEGATIVE

## 2018-02-23 ENCOUNTER — Emergency Department
Admission: EM | Admit: 2018-02-23 | Discharge: 2018-02-23 | Disposition: A | Discharge status: Home / Self Care | Payer: BLUE CROSS/BLUE SHIELD | Attending: Emergency Medicine | Admitting: Emergency Medicine

## 2018-02-23 ENCOUNTER — Other Ambulatory Visit: Payer: Self-pay

## 2018-02-23 ENCOUNTER — Emergency Department: Payer: BLUE CROSS/BLUE SHIELD

## 2018-02-23 ENCOUNTER — Encounter: Payer: Self-pay | Admitting: Emergency Medicine

## 2018-02-23 DIAGNOSIS — E282 Polycystic ovarian syndrome: Secondary | ICD-10-CM

## 2018-02-23 DIAGNOSIS — R1030 Lower abdominal pain, unspecified: Secondary | ICD-10-CM

## 2018-02-23 DIAGNOSIS — F17210 Nicotine dependence, cigarettes, uncomplicated: Secondary | ICD-10-CM | POA: Insufficient documentation

## 2018-02-23 DIAGNOSIS — I10 Essential (primary) hypertension: Secondary | ICD-10-CM | POA: Diagnosis not present

## 2018-02-23 DIAGNOSIS — R102 Pelvic and perineal pain: Secondary | ICD-10-CM | POA: Diagnosis not present

## 2018-02-23 LAB — WET PREP, GENITAL
Clue Cells Wet Prep HPF POC: NONE SEEN
SPERM: NONE SEEN
Trich, Wet Prep: NONE SEEN
YEAST WET PREP: NONE SEEN

## 2018-02-23 LAB — CBC
HEMATOCRIT: 42.7 % (ref 36.0–46.0)
Hemoglobin: 14.7 g/dL (ref 12.0–15.0)
MCH: 28.6 pg (ref 26.0–34.0)
MCHC: 34.4 g/dL (ref 30.0–36.0)
MCV: 83.1 fL (ref 80.0–100.0)
NRBC: 0 % (ref 0.0–0.2)
Platelets: 190 10*3/uL (ref 150–400)
RBC: 5.14 MIL/uL — ABNORMAL HIGH (ref 3.87–5.11)
RDW: 12.8 % (ref 11.5–15.5)
WBC: 9.2 10*3/uL (ref 4.0–10.5)

## 2018-02-23 LAB — POCT PREGNANCY, URINE: Preg Test, Ur: NEGATIVE

## 2018-02-23 LAB — COMPREHENSIVE METABOLIC PANEL
ALT: 14 U/L (ref 0–44)
AST: 20 U/L (ref 15–41)
Albumin: 4 g/dL (ref 3.5–5.0)
Alkaline Phosphatase: 70 U/L (ref 38–126)
Anion gap: 6 (ref 5–15)
BILIRUBIN TOTAL: 1.1 mg/dL (ref 0.3–1.2)
BUN: 8 mg/dL (ref 6–20)
CO2: 25 mmol/L (ref 22–32)
CREATININE: 0.83 mg/dL (ref 0.44–1.00)
Calcium: 9.2 mg/dL (ref 8.9–10.3)
Chloride: 108 mmol/L (ref 98–111)
GFR calc Af Amer: >60 mL/min (ref >60)
Glucose, Bld: 77 mg/dL (ref 70–99)
POTASSIUM: 3.6 mmol/L (ref 3.5–5.1)
Sodium: 139 mmol/L (ref 135–145)
TOTAL PROTEIN: 7.1 g/dL (ref 6.5–8.1)

## 2018-02-23 LAB — URINALYSIS, COMPLETE (UACMP) WITH MICROSCOPIC
BILIRUBIN URINE: NEGATIVE
Bacteria, UA: NONE SEEN
Glucose, UA: NEGATIVE mg/dL
Hgb urine dipstick: NEGATIVE
KETONES UR: NEGATIVE mg/dL
LEUKOCYTES UA: NEGATIVE
Nitrite: NEGATIVE
PH: 5 (ref 5.0–8.0)
Protein, ur: NEGATIVE mg/dL
Specific Gravity, Urine: 1.011 (ref 1.005–1.030)

## 2018-02-23 LAB — CHLAMYDIA/NGC RT PCR (ARMC ONLY)
Chlamydia Tr: NOT DETECTED
N GONORRHOEAE: NOT DETECTED

## 2018-02-23 NOTE — ED Notes (Signed)

## 2018-02-23 NOTE — ED Provider Notes (Addendum)
[facility] Emergency Department Provider Note  ____________________________________________  Time seen: Approximately 5:25 PM  I have reviewed the triage vital signs and the nursing notes.   HISTORY  Chief Complaint Urinary Tract Infection and Vaginal Discharge    HPI Kari Morales is a 22 y.o. female that presents to the emergency department for evaluation of low back pain, and suprapubic discomfort for 2 weeks and small amount of white vaginal discharge for 1 day.  The last couple of days she has noticed  her urine was cloudy.  No dysuria. Vaginal discharge does not have an odor.  She states that symptoms feel similar to a UTI.  She denies any abdominal pain currently.  Abdominal discomfort improves in the morning after she urinates. No pain currently.  She had chlamydia in the past and was treated. She has not changed partners and states that he was treated as well.She gets UTIs frequently and last one was 3 months ago. No history of kidney stones.No fever, chills.    Past Medical History:  Diagnosis Date  . Hypertension   . PCOS (polycystic ovarian syndrome)     There are no active problems to display for this patient.   Past Surgical History:  Procedure Laterality Date  . GANGLION CYST EXCISION Left    x2    Prior to Admission medications   Medication Sig Start Date End Date Taking? Authorizing Provider  acetaminophen (TYLENOL) 325 MG tablet Take 2 tablets (650 mg total) by mouth every 6 (six) hours as needed for mild pain or moderate pain. 05/09/16   Everlene Farrier, PA-C  doxycycline (VIBRAMYCIN) 100 MG capsule Take 1 capsule (100 mg total) by mouth 2 (two) times daily. 04/15/16   Raeford Razor, MD  traMADol (ULTRAM) 50 MG tablet Take 1 tablet (50 mg total) by mouth every 6 (six) hours as needed. 04/15/16   Raeford Razor, MD    Allergies Shrimp [shellfish allergy]  History reviewed. No pertinent family history.  Social History Social  History   Tobacco Use  . Smoking status: Current Every Day Smoker    Types: Cigarettes  . Smokeless tobacco: Never Used  Substance Use Topics  . Alcohol use: No  . Drug use: Never     Review of Systems  Constitutional: No fever/chills Cardiovascular: No chest pain. Respiratory: No SOB. Gastrointestinal: No nausea, no vomiting.  Genitourinary: Positive for dysuria. Musculoskeletal: Positive for back pain.  Skin: Negative for rash, abrasions, lacerations, ecchymosis.   ____________________________________________   PHYSICAL EXAM:  VITAL SIGNS: ED Triage Vitals  Enc Vitals Group     BP 02/23/18 1702 (!) 138/94     Pulse Rate 02/23/18 1702 87     Resp 02/23/18 1702 18     Temp 02/23/18 1702 98.5 F (36.9 C)     Temp Source 02/23/18 1702 Oral     SpO2 02/23/18 1702 100 %     Weight 02/23/18 1703 157 lb 10.1 oz (71.5 kg)     Height 02/23/18 1703 5\' 4"  (1.626 m)     Head Circumference --      Peak Flow --      Pain Score 02/23/18 1708 5     Pain Loc --      Pain Edu? --      Excl. in GC? --      Constitutional: Alert and oriented. Well appearing and in no acute distress. Eyes: Conjunctivae are normal. PERRL. EOMI. Head: Atraumatic. ENT:      Ears:  Nose: No congestion/rhinnorhea.      Mouth/Throat: Mucous membranes are moist.  Neck: No stridor.   Cardiovascular: Normal rate, regular rhythm.  Good peripheral circulation. Respiratory: Normal respiratory effort without tachypnea or retractions. Lungs CTAB. Good air entry to the bases with no decreased or absent breath sounds. Gastrointestinal: Bowel sounds 4 quadrants. Soft and nontender to palpation. No guarding or rigidity. No palpable masses. No distention. No CVA tenderness. Genitourinary: No external rashes or lesions.  Minimal white vaginal discharge.  No cervical motion tenderness. Musculoskeletal: Full range of motion to all extremities. No gross deformities appreciated. Neurologic:  Normal speech and  language. No gross focal neurologic deficits are appreciated.  Skin:  Skin is warm, dry and intact. No rash noted. Psychiatric: Mood and affect are normal. Speech and behavior are normal. Patient exhibits appropriate insight and judgement.   ____________________________________________   LABS (all labs ordered are listed, but only abnormal results are displayed)  Labs Reviewed  WET PREP, GENITAL - Abnormal; Notable for the following components:      Result Value   WBC, Wet Prep HPF POC RARE (*)    All other components within normal limits  URINALYSIS, COMPLETE (UACMP) WITH MICROSCOPIC - Abnormal; Notable for the following components:   Color, Urine YELLOW (*)    APPearance CLEAR (*)    All other components within normal limits  CBC - Abnormal; Notable for the following components:   RBC 5.14 (*)    All other components within normal limits  CHLAMYDIA/NGC RT PCR ([facility] ONLY)  COMPREHENSIVE METABOLIC PANEL  POC URINE PREG, ED  POCT PREGNANCY, URINE   ____________________________________________  EKG   ____________________________________________  RADIOLOGY   US Pelvic Complete W Transvaginal And Torsion R/o  Result Date: 02/23/2018 CLINICAL DATA:  Initial evaluation for acute lower abdominal pain for 2 weeks. EXAM: TRANSABDOMINAL AND TRANSVAGINAL ULTRASOUND OF PELVIS DOPPLER ULTRASOUND OF OVARIES TECHNIQUE: Both transabdominal and transvaginal ultrasound examinations of the pelvis were performed. Transabdominal technique was performed for global imaging of the pelvis including uterus, ovaries, adnexal regions, and pelvic cul-de-sac. It was necessary to proceed with endovaginal exam following the transabdominal exam to visualize the uterus, endometrium, and ovaries. Color and duplex Doppler ultrasound was utilized to evaluate blood flow to the ovaries. COMPARISON:  Prior ultrasound from 12/08/2017 FINDINGS: Uterus Measurements: 8.1 x 3.3 x 4.2 cm = volume: 58.8 mL. No  fibroids or other mass visualized. Endometrium Thickness: 11.3 mm.  No focal abnormality visualized. Right ovary Measurements: 5.4 x 4.2 x 4.4 cm = volume: 52.2 mL. 3.4 x 2.8 x 2.1 cm simple cyst, most consistent with a normal physiologic follicular cyst/dominant follicle. Left ovary Measurements: 3.1 x 2.4 x 2.4 cm = volume: 9.2 mL. 1.4 x 1.9 x 1.8 cm simple cyst, most consistent with a normal physiologic cyst/dominant follicle. Pulsed Doppler evaluation of both ovaries demonstrates normal low-resistance arterial and venous waveforms. Other findings No abnormal free fluid. IMPRESSION: 1. Simple bilateral renal cysts as above, measuring up to 3.4 cm on the right and 1.9 cm on the left. These have benign characteristics and are a common finding in premenopausal females, most likely reflecting physiologic follicular cysts and/or dominant follicles. No imaging follow up is required. This follows consensus guidelines: Simple Adnexal Cysts: SRU Consensus Conference Update on Follow-up and Reporting. Radiology 2019; [id number]. 2. No other acute abnormality within the pelvis. No evidence for torsion. Electronically Signed   By: Rise Mu M.D.   On: 02/23/2018 18:53    ____________________________________________  PROCEDURES  Procedure(s) performed:    Procedures    Medications - No data to display   ____________________________________________   INITIAL IMPRESSION / ASSESSMENT AND PLAN / ED COURSE  Pertinent labs & imaging results that were available during my care of the patient were reviewed by me and considered in my medical decision making (see chart for details).  Review of the [place] CSRS was performed in accordance of the [facility] prior to dispensing any controlled drugs.   Patient presented to emergency department for evaluation of lower abdominal discomfort and back pain for 2 weeks and vaginal discharge for 1 day. No symptoms currently.  Lab work largely unremarkable.  No  indication of infection on urinalysis.  Wet prep shows rare white blood cells.Gonorrhea and chlamydia are negative.   Ultrasound consistent with polycystic ovary syndrome.   Patient is to follow up with [facility] gynecology as directed. Patient is given ED precautions to return to the ED for any worsening or new symptoms.     ____________________________________________  FINAL CLINICAL IMPRESSION(S) / ED DIAGNOSES    NEW MEDICATIONS STARTED DURING THIS VISIT:  ED Discharge Orders    None          This chart was dictated using voice recognition software/Dragon. Despite best efforts to proofread, errors can occur which can change the meaning. Any change was purely unintentional.    Enid Derry, PA-C 02/23/18 1935    Enid Derry, PA-C 02/23/18 1937    Myrna Blazer, MD 02/23/18 2230

## 2018-02-23 NOTE — ED Triage Notes (Signed)
Pt presents to ED c/o UTI x2 wks. Reports cloudy urine and back pain.

## 2018-04-16 ENCOUNTER — Other Ambulatory Visit: Payer: Self-pay

## 2018-04-16 ENCOUNTER — Emergency Department
Admission: EM | Admit: 2018-04-16 | Discharge: 2018-04-16 | Disposition: A | Discharge status: Home / Self Care | Payer: BLUE CROSS/BLUE SHIELD | Attending: Emergency Medicine | Admitting: Emergency Medicine

## 2018-04-16 DIAGNOSIS — R0981 Nasal congestion: Secondary | ICD-10-CM | POA: Diagnosis not present

## 2018-04-16 DIAGNOSIS — I10 Essential (primary) hypertension: Secondary | ICD-10-CM | POA: Diagnosis not present

## 2018-04-16 DIAGNOSIS — J069 Acute upper respiratory infection, unspecified: Secondary | ICD-10-CM

## 2018-04-16 DIAGNOSIS — J3489 Other specified disorders of nose and nasal sinuses: Secondary | ICD-10-CM | POA: Insufficient documentation

## 2018-04-16 DIAGNOSIS — F17210 Nicotine dependence, cigarettes, uncomplicated: Secondary | ICD-10-CM | POA: Diagnosis not present

## 2018-04-16 DIAGNOSIS — B9789 Other viral agents as the cause of diseases classified elsewhere: Secondary | ICD-10-CM | POA: Diagnosis not present

## 2018-04-16 DIAGNOSIS — R05 Cough: Secondary | ICD-10-CM | POA: Diagnosis present

## 2018-04-16 MED ORDER — BENZONATATE 100 MG PO CAPS: 100.0000 mg | ORAL_CAPSULE | Freq: Three times a day (TID) | ORAL | 0 refills | Status: AC | PRN

## 2018-04-16 MED ORDER — FLUTICASONE PROPIONATE 50 MCG/ACT NA SUSP: 1.0000 | Freq: Every day | NASAL | 2 refills | Status: DC

## 2018-04-16 NOTE — ED Triage Notes (Signed)
URI sx, cough congestion that began last night. Pt alert and oriented X4, active, cooperative, pt in NAD. RR even and unlabored, color WNL.

## 2018-04-16 NOTE — ED Provider Notes (Signed)
[facility] Emergency Department Provider Note  ____________________________________________  Time seen: Approximately 5:14 PM  I have reviewed the triage vital signs and the nursing notes.   HISTORY  Chief Complaint Cough    HPI Kari Morales is a 22 y.o. female presents to the emergency department with 1 day of rhinorrhea, congestion and nonproductive cough for 1 day.  Patient denies pharyngitis, diarrhea or emesis.  Patient's father has had similar symptoms at home.  Patient is requesting a work note.  She is tolerating fluids and food by mouth.  No recent travel.  No alleviating measures have been attempted   Past Medical History:  Diagnosis Date  . Hypertension   . PCOS (polycystic ovarian syndrome)     There are no active problems to display for this patient.   Past Surgical History:  Procedure Laterality Date  . GANGLION CYST EXCISION Left    x2    Prior to Admission medications   Medication Sig Start Date End Date Taking? Authorizing Provider  acetaminophen (TYLENOL) 325 MG tablet Take 2 tablets (650 mg total) by mouth every 6 (six) hours as needed for mild pain or moderate pain. 05/09/16   Everlene Farrieransie, William, PA-C  benzonatate (TESSALON PERLES) 100 MG capsule Take 1 capsule (100 mg total) by mouth 3 (three) times daily as needed for up to 7 days for cough. 04/16/18 04/23/18  Orvil FeilWoods, Jaclyn M, PA-C  doxycycline (VIBRAMYCIN) 100 MG capsule Take 1 capsule (100 mg total) by mouth 2 (two) times daily. 04/15/16   Raeford RazorKohut, Stephen, MD  fluticasone (FLONASE) 50 MCG/ACT nasal spray Place 1 spray into both nostrils daily. 04/16/18 04/16/19  Orvil FeilWoods, Jaclyn M, PA-C  traMADol (ULTRAM) 50 MG tablet Take 1 tablet (50 mg total) by mouth every 6 (six) hours as needed. 04/15/16   Raeford RazorKohut, Stephen, MD    Allergies Shrimp [shellfish allergy]  No family history on file.  Social History Social History   Tobacco Use  . Smoking status: Current Every Day Smoker   Types: Cigarettes  . Smokeless tobacco: Never Used  Substance Use Topics  . Alcohol use: No  . Drug use: Never    Review of Systems  Constitutional: Patient has been afebrile.  Eyes: No visual changes. No discharge ENT: Patient has congestion.  Cardiovascular: no chest pain. Respiratory: Patient has cough.  Gastrointestinal: No abdominal pain.  No nausea, no vomiting. Patient had diarrhea.  Genitourinary: Negative for dysuria. No hematuria Musculoskeletal: No myalgias.  Skin: Negative for rash, abrasions, lacerations, ecchymosis. Neurological: Patient has headache, no focal weakness or numbness.    ____________________________________________   PHYSICAL EXAM:  VITAL SIGNS: ED Triage Vitals  Enc Vitals Group     BP 04/16/18 1351 (!) 140/97     Pulse Rate 04/16/18 1351 86     Resp --      Temp 04/16/18 1351 98.2 F (36.8 C)     Temp Source 04/16/18 1351 Oral     SpO2 04/16/18 1351 100 %     Weight 04/16/18 1354 160 lb (72.6 kg)     Height 04/16/18 1354 5\' 4"  (1.626 m)     Head Circumference --      Peak Flow --      Pain Score 04/16/18 1354 8     Pain Loc --      Pain Edu? --      Excl. in GC? --     Constitutional: Alert and oriented. Patient is lying supine. Eyes: Conjunctivae are normal. PERRL. EOMI.  Head: Atraumatic. ENT:      Ears: Tympanic membranes are mildly injected with mild effusion bilaterally.       Nose: No congestion/rhinnorhea.      Mouth/Throat: Mucous membranes are moist. Posterior pharynx is mildly erythematous.  Hematological/Lymphatic/Immunilogical: No cervical lymphadenopathy.  Cardiovascular: Normal rate, regular rhythm. Normal S1 and S2.  Good peripheral circulation. Respiratory: Normal respiratory effort without tachypnea or retractions. Lungs CTAB. Good air entry to the bases with no decreased or absent breath sounds. Gastrointestinal: Bowel sounds 4 quadrants. Soft and nontender to palpation. No guarding or rigidity. No palpable  masses. No distention. No CVA tenderness. Musculoskeletal: Full range of motion to all extremities. No gross deformities appreciated. Neurologic:  Normal speech and language. No gross focal neurologic deficits are appreciated.  Skin:  Skin is warm, dry and intact. No rash noted. Psychiatric: Mood and affect are normal. Speech and behavior are normal. Patient exhibits appropriate insight and judgement.      ____________________________________________   LABS (all labs ordered are listed, but only abnormal results are displayed)  Labs Reviewed - No data to display ____________________________________________  EKG   ____________________________________________  RADIOLOGY   No results found.  ____________________________________________    PROCEDURES  Procedure(s) performed:    Procedures    Medications - No data to display   ____________________________________________   INITIAL IMPRESSION / ASSESSMENT AND PLAN / ED COURSE  Pertinent labs & imaging results that were available during my care of the patient were reviewed by me and considered in my medical decision making (see chart for details).  Review of the [place] CSRS was performed in accordance of the [facility] prior to dispensing any controlled drugs.      Assessment and Plan:  Viral URI Patient presents to the emergency department with rhinorrhea, congestion and nonproductive cough for the past 24 hours.  Unspecified viral URI is likely at this time.  [facility].  She was advised to follow-up with primary care as needed.  A work note was provided as requested.    ____________________________________________  FINAL CLINICAL IMPRESSION(S) / ED DIAGNOSES  Final diagnoses:  Viral URI with cough      NEW MEDICATIONS STARTED DURING THIS VISIT:  ED Discharge Orders         Ordered    fluticasone (FLONASE) 50 MCG/ACT nasal spray  Daily     04/16/18 1549     benzonatate (TESSALON PERLES) 100 MG capsule  3 times daily PRN     04/16/18 1549              This chart was dictated using voice recognition software/Dragon. Despite best efforts to proofread, errors can occur which can change the meaning. Any change was purely unintentional.    Orvil FeilWoods, Jaclyn M, PA-C 04/16/18 1718    Dionne BucySiadecki, Sebastian, MD 04/17/18 [phone]

## 2018-06-24 ENCOUNTER — Encounter: Payer: Self-pay | Admitting: Emergency Medicine

## 2018-06-24 ENCOUNTER — Emergency Department
Admission: EM | Admit: 2018-06-24 | Discharge: 2018-06-24 | Disposition: A | Discharge status: Home / Self Care | Payer: BLUE CROSS/BLUE SHIELD | Attending: Emergency Medicine | Admitting: Emergency Medicine

## 2018-06-24 ENCOUNTER — Other Ambulatory Visit: Payer: Self-pay

## 2018-06-24 DIAGNOSIS — Y929 Unspecified place or not applicable: Secondary | ICD-10-CM | POA: Diagnosis not present

## 2018-06-24 DIAGNOSIS — Y939 Activity, unspecified: Secondary | ICD-10-CM | POA: Insufficient documentation

## 2018-06-24 DIAGNOSIS — I10 Essential (primary) hypertension: Secondary | ICD-10-CM | POA: Insufficient documentation

## 2018-06-24 DIAGNOSIS — Y999 Unspecified external cause status: Secondary | ICD-10-CM | POA: Diagnosis not present

## 2018-06-24 DIAGNOSIS — S39012A Strain of muscle, fascia and tendon of lower back, initial encounter: Secondary | ICD-10-CM | POA: Diagnosis not present

## 2018-06-24 DIAGNOSIS — X58XXXA Exposure to other specified factors, initial encounter: Secondary | ICD-10-CM | POA: Diagnosis not present

## 2018-06-24 DIAGNOSIS — F17210 Nicotine dependence, cigarettes, uncomplicated: Secondary | ICD-10-CM | POA: Insufficient documentation

## 2018-06-24 DIAGNOSIS — S3992XA Unspecified injury of lower back, initial encounter: Secondary | ICD-10-CM | POA: Diagnosis present

## 2018-06-24 LAB — URINALYSIS, COMPLETE (UACMP) WITH MICROSCOPIC
BILIRUBIN URINE: NEGATIVE
Bacteria, UA: NONE SEEN
Glucose, UA: NEGATIVE mg/dL
KETONES UR: NEGATIVE mg/dL
Leukocytes,Ua: NEGATIVE
Nitrite: NEGATIVE
PH: 6 (ref 5.0–8.0)
Protein, ur: NEGATIVE mg/dL
SPECIFIC GRAVITY, URINE: 1.012 (ref 1.005–1.030)

## 2018-06-24 LAB — POCT PREGNANCY, URINE: PREG TEST UR: NEGATIVE

## 2018-06-24 MED ORDER — MELOXICAM 15 MG PO TABS: 15.0000 mg | ORAL_TABLET | Freq: Every day | ORAL | 0 refills | Status: DC

## 2018-06-24 MED ORDER — CYCLOBENZAPRINE HCL 10 MG PO TABS: 10.0000 mg | ORAL_TABLET | Freq: Three times a day (TID) | ORAL | 0 refills | Status: DC | PRN

## 2018-06-24 NOTE — ED Provider Notes (Signed)
[facility] Emergency Department Provider Note ____________________________________________  Time seen: Approximately 6:08 PM  I have reviewed the triage vital signs and the nursing notes.   HISTORY  Chief Complaint Back Pain    HPI Kari Morales is a 23 y.o. female who presents to the emergency department for evaluation and treatment of low back pain.  She has a history of back pain but states over the past few days it has gotten worse.  No relief with over-the-counter medications.  Her job requires lifting and she feels that she has strain her back in some way.  She denies any urinary symptoms.  She states that her menstrual cycle this month only lasted 3 days which is uncommon for her.  Otherwise, no other symptoms of concern. Past Medical History:  Diagnosis Date  . Hypertension   . PCOS (polycystic ovarian syndrome)     There are no active problems to display for this patient.   Past Surgical History:  Procedure Laterality Date  . GANGLION CYST EXCISION Left    x2    Prior to Admission medications   Medication Sig Start Date End Date Taking? Authorizing Provider  acetaminophen (TYLENOL) 325 MG tablet Take 2 tablets (650 mg total) by mouth every 6 (six) hours as needed for mild pain or moderate pain. 05/09/16   Everlene Farrier, PA-C  cyclobenzaprine (FLEXERIL) 10 MG tablet Take 1 tablet (10 mg total) by mouth 3 (three) times daily as needed for muscle spasms. 06/24/18   Panayiota Larkin, Rulon Eisenmenger B, FNP  doxycycline (VIBRAMYCIN) 100 MG capsule Take 1 capsule (100 mg total) by mouth 2 (two) times daily. 04/15/16   Raeford Razor, MD  fluticasone (FLONASE) 50 MCG/ACT nasal spray Place 1 spray into both nostrils daily. 04/16/18 04/16/19  Orvil Feil, PA-C  meloxicam (MOBIC) 15 MG tablet Take 1 tablet (15 mg total) by mouth daily. 06/24/18   Overton Boggus B, FNP  traMADol (ULTRAM) 50 MG tablet Take 1 tablet (50 mg total) by mouth every 6 (six) hours as needed.  04/15/16   Raeford Razor, MD    Allergies Shrimp [shellfish allergy]  No family history on file.  Social History Social History   Tobacco Use  . Smoking status: Current Every Day Smoker    Types: Cigarettes  . Smokeless tobacco: Never Used  Substance Use Topics  . Alcohol use: No  . Drug use: Never    Review of Systems Constitutional: Negative for fever. Cardiovascular: Negative for chest pain. Respiratory: Negative for shortness of breath. Musculoskeletal: Positive for low back pain Skin: Negative for open wound or lesion Neurological: Negative for decrease in sensation  ____________________________________________   PHYSICAL EXAM:  VITAL SIGNS: ED Triage Vitals  Enc Vitals Group     BP 06/24/18 1735 (!) 139/99     Pulse --      Resp 06/24/18 1734 16     Temp 06/24/18 1734 98.4 F (36.9 C)     Temp Source 06/24/18 1734 Oral     SpO2 06/24/18 1734 100 %     Weight --      Height --      Head Circumference --      Peak Flow --      Pain Score --      Pain Loc --      Pain Edu? --      Excl. in GC? --     Constitutional: Alert and oriented. Well appearing and in no acute distress. Eyes: Conjunctivae are  clear without discharge or drainage Head: Atraumatic Neck: Supple Respiratory: No cough. Respirations are even and unlabored.  Breath sounds are clear to auscultation Musculoskeletal: Patient able to flex at the waist.  She is observed ambulating unassisted with steady nonantalgic gait Neurologic: Motor and sensory function is intact Skin: No open wounds or lesions Psychiatric: Affect and behavior are appropriate.  ____________________________________________   LABS (all labs ordered are listed, but only abnormal results are displayed)  Labs Reviewed  URINALYSIS, COMPLETE (UACMP) WITH MICROSCOPIC - Abnormal; Notable for the following components:      Result Value   Color, Urine YELLOW (*)    APPearance CLEAR (*)    Hgb urine dipstick MODERATE (*)     All other components within normal limits  POC URINE PREG, ED  POCT PREGNANCY, URINE   ____________________________________________  RADIOLOGY  Not indicated ____________________________________________   PROCEDURES  Procedures  ____________________________________________   INITIAL IMPRESSION / ASSESSMENT AND PLAN / ED COURSE  Kari Morales is a 23 y.o. who presents to the emergency department for evaluation of low back pain.  Symptoms and exam are most consistent with a lumbosacral strain.  She will be treated with Flexeril and meloxicam.  She was given a work note for the next few days.  She was encouraged to follow-up with primary care or return to the emergency department for symptoms change or worsen if she is unable to schedule appointment.  Medications - No data to display  Pertinent labs & imaging results that were available during my care of the patient were reviewed by me and considered in my medical decision making (see chart for details).  _________________________________________   FINAL CLINICAL IMPRESSION(S) / ED DIAGNOSES  Final diagnoses:  Lumbosacral strain, initial encounter    ED Discharge Orders         Ordered    cyclobenzaprine (FLEXERIL) 10 MG tablet  3 times daily PRN     06/24/18 1851    meloxicam (MOBIC) 15 MG tablet  Daily     06/24/18 1851           If controlled substance prescribed during this visit, 12 month history viewed on the [place] prior to issuing an initial prescription for Schedule II or III opiod.    Chinita Pester, FNP 06/24/18 1914    Jene Every, MD 06/24/18 [phone]

## 2018-06-24 NOTE — ED Triage Notes (Signed)
Pt c./o lower back pain. Hx of back pain. Denies any acute injury or GU symptoms. Ambulatory

## 2018-06-30 ENCOUNTER — Other Ambulatory Visit: Payer: Self-pay

## 2018-06-30 ENCOUNTER — Encounter (HOSPITAL_COMMUNITY): Payer: Self-pay

## 2018-06-30 ENCOUNTER — Emergency Department (HOSPITAL_COMMUNITY)
Admission: EM | Admit: 2018-06-30 | Discharge: 2018-06-30 | Disposition: A | Discharge status: Home / Self Care | Payer: BLUE CROSS/BLUE SHIELD | Attending: Emergency Medicine | Admitting: Emergency Medicine

## 2018-06-30 DIAGNOSIS — I10 Essential (primary) hypertension: Secondary | ICD-10-CM | POA: Diagnosis not present

## 2018-06-30 DIAGNOSIS — M5442 Lumbago with sciatica, left side: Secondary | ICD-10-CM | POA: Insufficient documentation

## 2018-06-30 DIAGNOSIS — M5441 Lumbago with sciatica, right side: Secondary | ICD-10-CM

## 2018-06-30 DIAGNOSIS — M545 Low back pain: Secondary | ICD-10-CM | POA: Diagnosis present

## 2018-06-30 DIAGNOSIS — F17210 Nicotine dependence, cigarettes, uncomplicated: Secondary | ICD-10-CM | POA: Insufficient documentation

## 2018-06-30 MED ORDER — PREDNISONE 20 MG PO TABS: 40.0000 mg | ORAL_TABLET | Freq: Every day | ORAL | 0 refills | Status: AC

## 2018-06-30 NOTE — Discharge Instructions (Addendum)
You were evaluated in the Emergency Department and after careful evaluation, we did not find any emergent condition requiring admission or further testing in the hospital.  Your symptoms today seem to be due to muscle strain or spasm of the back.  It could also be due to a mild herniated disc.  Please use the steroid medication as directed.  Please return to the Emergency Department if you experience any worsening of your condition.  We encourage you to follow up with a primary care provider.  Thank you for allowing [place] to be a part of your care.

## 2018-06-30 NOTE — ED Triage Notes (Signed)
Pt reports lower back pain for the past week, worse with movement. No urinary symptoms. Pt seen at [facility] for the same. Pt told she had a muscle spasm and was worked up for UTI. Pt given meloxicm and cyclopenzaprine for pain without relief of pain. Pt ambulatory.

## 2018-06-30 NOTE — ED Provider Notes (Signed)
[facility] Emergency Department Provider Note MRN:  [id number]  Arrival date & time: 06/30/18     Chief Complaint   Back Pain   History of Present Illness   Kari Morales is a 23 y.o. year-old female with a history of hypertension presenting to the ED with chief complaint of back pain.  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  The pain continues to worsen with time, located bilaterally in the lumbar back, radiating down the front of both legs.  Moderate in severity, denies numbness or weakness, no bowel or bladder dysfunction, no fever, no IV drug use, no chest pain or shortness of breath, no abdominal pain.  Worse with motion.  Review of Systems  A complete 10 system review of systems was obtained and all systems are negative except as noted in the HPI and PMH.   Patient's Health History    Past Medical History:  Diagnosis Date  . Hypertension   . PCOS (polycystic ovarian syndrome)     Past Surgical History:  Procedure Laterality Date  . GANGLION CYST EXCISION Left    x2    No family history on file.  Social History   Socioeconomic History  . Marital status: Single    Spouse name: Not on file  . Number of children: Not on file  . Years of education: Not on file  . Highest education level: Not on file  Occupational History  . Not on file  Social Needs  . Financial resource strain: Not on file  . Food insecurity:    Worry: Not on file    Inability: Not on file  . Transportation needs:    Medical: Not on file    Non-medical: Not on file  Tobacco Use  . Smoking status: Current Every Day Smoker    Types: Cigarettes  . Smokeless tobacco: Never Used  Substance and Sexual Activity  . Alcohol use: No  . Drug use: Never  . Sexual activity: Not on file  Lifestyle  . Physical activity:   Days per week: Not on file    Minutes per session: Not on file  . Stress: Not on file  Relationships  . Social connections:    Talks on phone: Not on file    Gets together: Not on file    Attends religious service: Not on file    Active member of club or organization: Not on file    Attends meetings of clubs or organizations: Not on file    Relationship status: Not on file  . Intimate partner violence:    Fear of current or ex partner: Not on file    Emotionally abused: Not on file    Physically abused: Not on file    Forced sexual activity: Not on file  Other Topics Concern  . Not on file  Social History Narrative  . Not on file     Physical Exam  Vital Signs and Nursing Notes reviewed Vitals:   06/30/18 1548  BP: (!) 138/99  Pulse: (!) 106  Resp: 16  Temp: 98.5 F (36.9 C)  SpO2: 100%    CONSTITUTIONAL: Well-appearing, NAD NEURO:  Alert and oriented x 3, normal and symmetric strength and sensation, 1+ symmetric patellar reflexes, no saddle anesthesia. EYES:  eyes equal and reactive ENT/NECK:  no LAD, no JVD CARDIO:  Regular rate, well-perfused, normal S1 and S2 PULM:  CTAB no wheezing or rhonchi GI/GU:  normal bowel sounds, non-distended, non-tender MSK/SPINE:  No gross deformities, no edema SKIN:  no rash, atraumatic PSYCH:  Appropriate speech and behavior  Diagnostic and Interventional Summary    Labs Reviewed - No data to display  No orders to display    Medications - No data to display   Procedures Critical Care  ED Course and Medical Decision Making  I have reviewed the triage vital signs and the nursing notes.  Pertinent labs & imaging results that were available during my care of the patient were reviewed by me and considered in my medical decision making (see below for details).  No red flags to suggest myelopathy, suspect radicular back pain, has tried many other medications, will trial prednisone.  Abdomen soft and nontender.  Has already been  evaluated for UTI, no UTI symptoms today, no suprapubic tenderness.  After the discussed management above, the patient was determined to be safe for discharge.  The patient was in agreement with this plan and all questions regarding their care were answered.  ED return precautions were discussed and the patient will return to the ED with any significant worsening of condition.  Elmer Sow. Pilar Plate, MD [facility] Emergency Medicine [facility] [email or web address]  Final Clinical Impressions(s) / ED Diagnoses     ICD-10-CM   1. Acute bilateral low back pain with bilateral sciatica M54.42    M54.41     ED Discharge Orders         Ordered    predniSONE (DELTASONE) 20 MG tablet  Daily     06/30/18 1619             Sabas Sous, MD 06/30/18 1622

## 2018-11-05 ENCOUNTER — Encounter: Payer: Self-pay | Admitting: Emergency Medicine

## 2018-11-05 ENCOUNTER — Other Ambulatory Visit: Payer: Self-pay

## 2018-11-05 ENCOUNTER — Emergency Department
Admission: EM | Admit: 2018-11-05 | Discharge: 2018-11-05 | Disposition: A | Discharge status: Home / Self Care | Payer: BLUE CROSS/BLUE SHIELD | Attending: Emergency Medicine | Admitting: Emergency Medicine

## 2018-11-05 ENCOUNTER — Emergency Department: Payer: BLUE CROSS/BLUE SHIELD

## 2018-11-05 DIAGNOSIS — M545 Low back pain: Secondary | ICD-10-CM | POA: Diagnosis not present

## 2018-11-05 DIAGNOSIS — B379 Candidiasis, unspecified: Secondary | ICD-10-CM

## 2018-11-05 DIAGNOSIS — N83209 Unspecified ovarian cyst, unspecified side: Secondary | ICD-10-CM

## 2018-11-05 DIAGNOSIS — I10 Essential (primary) hypertension: Secondary | ICD-10-CM | POA: Diagnosis not present

## 2018-11-05 DIAGNOSIS — R1030 Lower abdominal pain, unspecified: Secondary | ICD-10-CM

## 2018-11-05 DIAGNOSIS — F17210 Nicotine dependence, cigarettes, uncomplicated: Secondary | ICD-10-CM | POA: Insufficient documentation

## 2018-11-05 LAB — COMPREHENSIVE METABOLIC PANEL
ALT: 14 U/L (ref 0–44)
AST: 19 U/L (ref 15–41)
Albumin: 4 g/dL (ref 3.5–5.0)
Alkaline Phosphatase: 73 U/L (ref 38–126)
Anion gap: 8 (ref 5–15)
BUN: 8 mg/dL (ref 6–20)
CO2: 22 mmol/L (ref 22–32)
Calcium: 9 mg/dL (ref 8.9–10.3)
Chloride: 106 mmol/L (ref 98–111)
Creatinine, Ser: 0.88 mg/dL (ref 0.44–1.00)
GFR calc Af Amer: >60 mL/min (ref >60)
GFR calc non Af Amer: >60 mL/min (ref >60)
Glucose, Bld: 110 mg/dL — ABNORMAL HIGH (ref 70–99)
Potassium: 3.6 mmol/L (ref 3.5–5.1)
Sodium: 136 mmol/L (ref 135–145)
Total Bilirubin: 0.9 mg/dL (ref 0.3–1.2)
Total Protein: 7.1 g/dL (ref 6.5–8.1)

## 2018-11-05 LAB — CBC
HCT: 43.2 % (ref 36.0–46.0)
Hemoglobin: 14.7 g/dL (ref 12.0–15.0)
MCH: 27.9 pg (ref 26.0–34.0)
MCHC: 34 g/dL (ref 30.0–36.0)
MCV: 82.1 fL (ref 80.0–100.0)
Platelets: 179 10*3/uL (ref 150–400)
RBC: 5.26 MIL/uL — ABNORMAL HIGH (ref 3.87–5.11)
RDW: 12.9 % (ref 11.5–15.5)
WBC: 9.2 10*3/uL (ref 4.0–10.5)
nRBC: 0 % (ref 0.0–0.2)

## 2018-11-05 LAB — URINALYSIS, COMPLETE (UACMP) WITH MICROSCOPIC
Bilirubin Urine: NEGATIVE
Glucose, UA: NEGATIVE mg/dL
Hgb urine dipstick: NEGATIVE
Ketones, ur: NEGATIVE mg/dL
Nitrite: NEGATIVE
Protein, ur: NEGATIVE mg/dL
Specific Gravity, Urine: 1.014 (ref 1.005–1.030)
pH: 5 (ref 5.0–8.0)

## 2018-11-05 LAB — WET PREP, GENITAL
Clue Cells Wet Prep HPF POC: NONE SEEN
Sperm: NONE SEEN
Trich, Wet Prep: NONE SEEN

## 2018-11-05 LAB — CHLAMYDIA/NGC RT PCR (ARMC ONLY)

## 2018-11-05 LAB — CHLAMYDIA/NGC RT PCR (ARMC ONLY)??????????
Chlamydia Tr: NOT DETECTED
N gonorrhoeae: NOT DETECTED

## 2018-11-05 LAB — POCT PREGNANCY, URINE: Preg Test, Ur: NEGATIVE

## 2018-11-05 MED ORDER — FLUCONAZOLE 50 MG PO TABS
150.0000 mg | ORAL_TABLET | Freq: Once | ORAL | Status: AC
  Administered 2018-11-05: 150 mg via ORAL
  Filled 2018-11-05: qty 1

## 2018-11-05 NOTE — ED Notes (Signed)
See triage note   Presents with lower abd /back discomfort  Also has ahd some vaginal itching and pain   Denies any vaginal discharge

## 2018-11-05 NOTE — ED Notes (Signed)
Patient declined discharge vital signs. 

## 2018-11-05 NOTE — ED Notes (Signed)
Patient taken to ultrasound.

## 2018-11-05 NOTE — ED Provider Notes (Signed)
[facility] Emergency Department Provider Note  ____________________________________________  Time seen: Approximately 1:56 PM  I have reviewed the triage vital signs and the nursing notes.   HISTORY  Chief Complaint Abdominal Pain    HPI Kari Morales is a 23 y.o. female that presents to the emergency department for evaluation of lower abdominal pain discomfort for 3 weeks and an STD test.  Patient states that she has had problems with PCOS in the past and this feels the same.  Patient states that she had intercourse with a new partner last weekend and did not use protection.  She has had some vaginal itching which feels like yeast infections that she has had previously.  She has not noticed any vaginal discharge.  She has had some trouble with low back discomfort for several months now.  No fevers, vomiting, dysuria, hematuria.   Past Medical History:  Diagnosis Date  . Hypertension   . PCOS (polycystic ovarian syndrome)     There are no active problems to display for this patient.   Past Surgical History:  Procedure Laterality Date  . GANGLION CYST EXCISION Left    x2    Prior to Admission medications   Not on File    Allergies Shrimp [shellfish allergy]  No family history on file.  Social History Social History   Tobacco Use  . Smoking status: Current Every Day Smoker    Types: Cigarettes  . Smokeless tobacco: Never Used  Substance Use Topics  . Alcohol use: No  . Drug use: Never     Review of Systems  Constitutional: No fever/chills Respiratory:  No SOB. Gastrointestinal: Positive for lower abdominal pain.  No nausea, no vomiting.  Genitourinary: Negative for dysuria. Musculoskeletal: Positive for back discomfort. Skin: Negative for rash, abrasions, lacerations, ecchymosis.   ____________________________________________   PHYSICAL EXAM:  VITAL SIGNS: ED Triage Vitals  Enc Vitals Group     BP 11/05/18 1230 (!) 140/103      Pulse Rate 11/05/18 1230 (!) 102     Resp 11/05/18 1230 16     Temp 11/05/18 1230 98.7 F (37.1 C)     Temp Source 11/05/18 1230 Oral     SpO2 11/05/18 1231 99 %     Weight 11/05/18 1231 170 lb (77.1 kg)     Height 11/05/18 1231 5\' 4"  (1.626 m)     Head Circumference --      Peak Flow --      Pain Score 11/05/18 1231 7     Pain Loc --      Pain Edu? --      Excl. in [place]? --      Constitutional: Alert and oriented. Well appearing and in no acute distress. Eyes: Conjunctivae are normal. PERRL. EOMI. Head: Atraumatic. ENT:      Ears:      Nose: No congestion/rhinnorhea.      Mouth/Throat: Mucous membranes are moist.  Neck: No stridor.   Cardiovascular: Normal rate, regular rhythm.  Good peripheral circulation. Respiratory: Normal respiratory effort without tachypnea or retractions. Lungs CTAB. Good air entry to the bases with no decreased or absent breath sounds. Gastrointestinal: Bowel sounds 4 quadrants. Soft and nontender to palpation. No guarding or rigidity. No palpable masses. No distention. No CVA tenderness. Genitourinary: No external rashes or lesions seen.  Thin white vaginal discharge.  No cervical motion tenderness. Musculoskeletal: Full range of motion to all extremities. No gross deformities appreciated.  Normal gait. Neurologic:  Normal speech and  language. No gross focal neurologic deficits are appreciated.  Skin:  Skin is warm, dry and intact. No rash noted. Psychiatric: Mood and affect are normal. Speech and behavior are normal. Patient exhibits appropriate insight and judgement.   ____________________________________________   LABS (all labs ordered are listed, but only abnormal results are displayed)  Labs Reviewed  WET PREP, GENITAL - Abnormal; Notable for the following components:      Result Value   Yeast Wet Prep HPF POC PRESENT (*)    WBC, Wet Prep HPF POC MODERATE (*)    All other components within normal limits  COMPREHENSIVE METABOLIC PANEL  - Abnormal; Notable for the following components:   Glucose, Bld 110 (*)    All other components within normal limits  CBC - Abnormal; Notable for the following components:   RBC 5.26 (*)    All other components within normal limits  URINALYSIS, COMPLETE (UACMP) WITH MICROSCOPIC - Abnormal; Notable for the following components:   Color, Urine YELLOW (*)    APPearance HAZY (*)    Leukocytes,Ua TRACE (*)    Bacteria, UA RARE (*)    All other components within normal limits  CHLAMYDIA/NGC RT PCR ([facility] ONLY)  URINE CULTURE  POC URINE PREG, ED  POCT PREGNANCY, URINE   ____________________________________________  EKG   ____________________________________________  RADIOLOGY  [place] Pelvic Complete With Transvaginal  Result Date: 11/05/2018 CLINICAL DATA:  Lower abdominal pain for 3 weeks. EXAM: TRANSABDOMINAL AND TRANSVAGINAL ULTRASOUND OF PELVIS TECHNIQUE: Both transabdominal and transvaginal ultrasound examinations of the pelvis were performed. Transabdominal technique was performed for global imaging of the pelvis including uterus, ovaries, adnexal regions, and pelvic cul-de-sac. It was necessary to proceed with endovaginal exam following the transabdominal exam to visualize the ovaries. COMPARISON:  Pelvic ultrasound 05/08/2016. FINDINGS: Uterus Measurements: 7.6 x 3.0 x 4.2 cm = volume: 50.5 mL. No fibroids or other mass visualized. Endometrium Thickness: 0.5 cm.  No focal abnormality visualized. Right ovary Measurements: 4.2 x 3.0 x 4.0 cm = volume: 22.3 mL. Cyst measuring 2.6 x 2.6 x 1.6 cm has some internal echoes. Left ovary Measurements: 2.4 x 2.3 x 4.0 cm = volume: 11.3 mL. Normal appearance/no adnexal mass. Other findings No abnormal free fluid. IMPRESSION: Right ovarian cyst is mildly complex and likely a hemorrhagic cyst. Recommend followup ultrasound in 1 week to ensure resolution. Electronically Signed   By: Drusilla Kannerhomas  Dalessio M.D.   On: 11/05/2018 16:24     ____________________________________________    PROCEDURES  Procedure(s) performed:    Procedures    Medications  fluconazole (DIFLUCAN) tablet 150 mg (150 mg Oral Given 11/05/18 1737)     ____________________________________________   INITIAL IMPRESSION / ASSESSMENT AND PLAN / ED COURSE  Pertinent labs & imaging results that were available during my care of the patient were reviewed by me and considered in my medical decision making (see chart for details).  Review of the [place] CSRS was performed in accordance of the [facility] prior to dispensing any controlled drugs.   Patient presented to the emergency department for evaluation of low abdominal discomfort for several weeks and concern for STD.  Vital signs and exam are reassuring.  Abdominal ultrasound consistent with right hemorrhagic ovarian cyst.  Wet prep consistent with yeast.  Gonorrhea and Chlamydia are negative.  Patient has some rare bacteria on urinalysis and will be sent for culture.  Patient is to follow up with OB/GYN as directed. Patient is given ED precautions to return to the ED for any worsening or  new symptoms.  Kari Morales was evaluated in Emergency Department on 11/05/2018 for the symptoms described in the history of present illness. She was evaluated in the context of the global COVID-19 pandemic, which necessitated consideration that the patient might be at risk for infection with the SARS-CoV-2 virus that causes COVID-19. Institutional protocols and algorithms that pertain to the evaluation of patients at risk for COVID-19 are in a state of rapid change based on information released by regulatory bodies including the CDC and federal and state organizations. These policies and algorithms were followed during the patient's care in the ED.   ____________________________________________  FINAL CLINICAL IMPRESSION(S) / ED DIAGNOSES  Final diagnoses:  Lower abdominal pain  Yeast infection  Hemorrhagic ovarian  cyst      NEW MEDICATIONS STARTED DURING THIS VISIT:  ED Discharge Orders    None          This chart was dictated using voice recognition software/Dragon. Despite best efforts to proofread, errors can occur which can change the meaning. Any change was purely unintentional.    Enid DerryWagner, Dorna Mallet, PA-C 11/05/18 2039    Phineas SemenGoodman, [place], MD 11/05/18 2133

## 2018-11-05 NOTE — ED Triage Notes (Signed)
Pt arrives with complaints of lower abdominal pain. Pt states "I know it's my ovaries, I have PCOS and the pain goes to my back." pt reports "it itches down there and I want to be tested for STDs, I made a mistake and did it with someone who wasn't my man." No bleeding or discharge reported. Pt reports pain started 3 weeks ago.

## 2018-11-07 LAB — URINE CULTURE: Culture: 60000 — AB

## 2018-11-08 ENCOUNTER — Telehealth: Payer: Self-pay

## 2018-11-08 NOTE — Progress Notes (Addendum)
Discussed patient with Dr. Marjean Donna.   Called and LVM. Urine culture grew back Lactobacillus species - likely a contaminant. UA possibly not a clean sample. Pregnancy results were negative. Patient was discharged for a yeast infection. Will called the patient again to discuss her current presentation.   Update @1548  on 11/08/18: I called patient's mother, to get in contact with Kari Morales, and was told she would be at work until 59 pm tonight. I asked the mother if she would have her daughter call back and she agreed to relay the message (about returning my call).   Update @1600  on 11/08/18: Patient called back and reported that since she was last seen in the ED she continues to have vaginal burning after intercourse and that is accompanied with dysuria (mostly after intercourse). Denied fevers. Occasionally, she reports dysuria and emphasized this occurs s/p intercourse. She denies frequent urination. She does reports low back pain, but it is no more than her baseline. 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 I asked the patient to come back into the ED for repeat urine culture if she was concerned, but that it was not necessary. Will call in Keflex 500 mg Q6H x5 days.   Thank you for allowing pharmacy to be a part of this patient's care.   Kristeen Miss, PharmD Clinical Pharmacist

## 2018-11-08 NOTE — Progress Notes (Signed)
Discussed patient with Dr. Marjean Donna.   Called and LVM. Urine culture grew back Lactobacillus species - likely a contaminant. Pregnancy results were negative. Patient was discharged for a yeast infection. Will called the patient again to discuss her current presentation.   Update @1548  on 11/08/18: I called patient's mother, to get in contact with Ms. Felty, and was told she would be at work until 25 pm tonight. I asked the mother if she would have her daughter call back and she agreed to relay the message (about returning my call).   Thank you for allowing pharmacy to be a part of this patient's care.   Kristeen Miss, PharmD Clinical Pharmacist

## 2018-11-08 NOTE — Progress Notes (Signed)
Discussed patient with Dr. Marjean Donna.   Called and LVM. Urine culture grew back Lactobacillus species - likely a contaminant. Pregnancy results were negative. Patient was discharged for a yeast infection. Will called the patient again to discuss her current presentation.    Thank you for allowing pharmacy to be a part of this patient's care.   Kristeen Miss, PharmD Clinical Pharmacist

## 2019-02-16 ENCOUNTER — Encounter: Payer: Self-pay | Admitting: Emergency Medicine

## 2019-02-16 ENCOUNTER — Emergency Department
Admission: EM | Admit: 2019-02-16 | Discharge: 2019-02-16 | Disposition: A | Discharge status: Home / Self Care | Payer: BLUE CROSS/BLUE SHIELD | Attending: Student in an Organized Health Care Education/Training Program | Admitting: Student in an Organized Health Care Education/Training Program

## 2019-02-16 ENCOUNTER — Other Ambulatory Visit: Payer: Self-pay

## 2019-02-16 DIAGNOSIS — B9689 Other specified bacterial agents as the cause of diseases classified elsewhere: Secondary | ICD-10-CM | POA: Insufficient documentation

## 2019-02-16 DIAGNOSIS — F17290 Nicotine dependence, other tobacco product, uncomplicated: Secondary | ICD-10-CM | POA: Insufficient documentation

## 2019-02-16 DIAGNOSIS — I10 Essential (primary) hypertension: Secondary | ICD-10-CM | POA: Insufficient documentation

## 2019-02-16 DIAGNOSIS — N760 Acute vaginitis: Secondary | ICD-10-CM | POA: Insufficient documentation

## 2019-02-16 DIAGNOSIS — R1013 Epigastric pain: Secondary | ICD-10-CM

## 2019-02-16 LAB — WET PREP, GENITAL
Sperm: NONE SEEN
Trich, Wet Prep: NONE SEEN
Yeast Wet Prep HPF POC: NONE SEEN

## 2019-02-16 LAB — URINALYSIS, COMPLETE (UACMP) WITH MICROSCOPIC
Bilirubin Urine: NEGATIVE
Glucose, UA: NEGATIVE mg/dL
Hgb urine dipstick: NEGATIVE
Ketones, ur: NEGATIVE mg/dL
Leukocytes,Ua: NEGATIVE
Nitrite: NEGATIVE
Protein, ur: NEGATIVE mg/dL
Specific Gravity, Urine: 1.014 (ref 1.005–1.030)
pH: 5 (ref 5.0–8.0)

## 2019-02-16 LAB — COMPREHENSIVE METABOLIC PANEL
ALT: 13 U/L (ref 0–44)
AST: 19 U/L (ref 15–41)
Albumin: 4.2 g/dL (ref 3.5–5.0)
Alkaline Phosphatase: 62 U/L (ref 38–126)
Anion gap: 9 (ref 5–15)
BUN: 9 mg/dL (ref 6–20)
CO2: 24 mmol/L (ref 22–32)
Calcium: 9.2 mg/dL (ref 8.9–10.3)
Chloride: 107 mmol/L (ref 98–111)
Creatinine, Ser: 0.94 mg/dL (ref 0.44–1.00)
GFR calc Af Amer: >60 mL/min (ref >60)
GFR calc non Af Amer: >60 mL/min (ref >60)
Glucose, Bld: 100 mg/dL — ABNORMAL HIGH (ref 70–99)
Potassium: 3.8 mmol/L (ref 3.5–5.1)
Sodium: 140 mmol/L (ref 135–145)
Total Bilirubin: 0.6 mg/dL (ref 0.3–1.2)
Total Protein: 8 g/dL (ref 6.5–8.1)

## 2019-02-16 LAB — CBC
HCT: 45.6 % (ref 36.0–46.0)
Hemoglobin: 15.3 g/dL — ABNORMAL HIGH (ref 12.0–15.0)
MCH: 27.4 pg (ref 26.0–34.0)
MCHC: 33.6 g/dL (ref 30.0–36.0)
MCV: 81.6 fL (ref 80.0–100.0)
Platelets: 188 10*3/uL (ref 150–400)
RBC: 5.59 MIL/uL — ABNORMAL HIGH (ref 3.87–5.11)
RDW: 12.8 % (ref 11.5–15.5)
WBC: 7.1 10*3/uL (ref 4.0–10.5)
nRBC: 0 % (ref 0.0–0.2)

## 2019-02-16 LAB — LIPASE, BLOOD: Lipase: 23 U/L (ref 11–51)

## 2019-02-16 LAB — POCT PREGNANCY, URINE: Preg Test, Ur: NEGATIVE

## 2019-02-16 MED ORDER — OMEPRAZOLE 40 MG PO CPDR: 40.0000 mg | DELAYED_RELEASE_CAPSULE | Freq: Every day | ORAL | 1 refills | Status: DC

## 2019-02-16 MED ORDER — LIDOCAINE VISCOUS HCL 2 % MT SOLN
15.0000 mL | Freq: Once | OROMUCOSAL | Status: AC
  Administered 2019-02-16: 15 mL via ORAL
  Filled 2019-02-16: qty 15

## 2019-02-16 MED ORDER — ONDANSETRON HCL 4 MG PO TABS: 4.0000 mg | ORAL_TABLET | Freq: Every day | ORAL | 0 refills | Status: DC | PRN

## 2019-02-16 MED ORDER — ALUM & MAG HYDROXIDE-SIMETH 200-200-20 MG/5ML PO SUSP
30.0000 mL | Freq: Once | ORAL | Status: AC
  Administered 2019-02-16: 30 mL via ORAL
  Filled 2019-02-16: qty 30

## 2019-02-16 MED ORDER — METRONIDAZOLE 500 MG PO TABS: 500.0000 mg | ORAL_TABLET | Freq: Two times a day (BID) | ORAL | 0 refills | Status: AC

## 2019-02-16 NOTE — Discharge Instructions (Addendum)

## 2019-02-16 NOTE — ED Notes (Signed)
PT reports constipation for a few days

## 2019-02-16 NOTE — ED Provider Notes (Signed)
[facility] Emergency Department Provider Note    First MD Initiated Contact with Patient 02/16/19 1522     (approximate)  I have reviewed the triage vital signs and the nursing notes.   HISTORY  Chief Complaint Abdominal Pain    HPI Kari Morales is a 23 y.o. female [place] past medical history presents the ER for 3 days of remittent epigastric pain that she feels is consistent with her history of heartburn and reflux.  Not have any fevers.  Also having some vaginal itchiness and some discharge.  States that she is monogamous with one partner.  Is not noticing any significant pelvic pain.  States that she will have some intermittent cramping but feels like she is about to start her menstrual cycle.  Not have any abdominal pain at this moment.  States that when she has it is similar to previous menstrual cramps that she has had.    Past Medical History:  Diagnosis Date  . Hypertension   . PCOS (polycystic ovarian syndrome)    No family history on file. Past Surgical History:  Procedure Laterality Date  . GANGLION CYST EXCISION Left    x2   There are no active problems to display for this patient.     Prior to Admission medications   Medication Sig Start Date End Date Taking? Authorizing Provider  metroNIDAZOLE (FLAGYL) 500 MG tablet Take 1 tablet (500 mg total) by mouth 2 (two) times daily for 7 days. 02/16/19 02/23/19  Merlyn Lot, MD  omeprazole (PRILOSEC) 40 MG capsule Take 1 capsule (40 mg total) by mouth daily. 02/16/19 02/16/20  Merlyn Lot, MD  ondansetron (ZOFRAN) 4 MG tablet Take 1 tablet (4 mg total) by mouth daily as needed. 02/16/19 02/16/20  Merlyn Lot, MD    Allergies Shrimp Dia Sitter allergy]    Social History Social History   Tobacco Use  . Smoking status: Current Every Day Smoker    Types: Cigars  . Smokeless tobacco: Never Used  Substance Use Topics  . Alcohol use: No  . Drug use: Never     Review of Systems Patient denies headaches, rhinorrhea, blurry vision, numbness, shortness of breath, chest pain, edema, cough, abdominal pain, nausea, vomiting, diarrhea, dysuria, fevers, rashes or hallucinations unless otherwise stated above in HPI. ____________________________________________   PHYSICAL EXAM:  VITAL SIGNS: Vitals:   02/16/19 1422  BP: (!) 151/109  Pulse: 75  Resp: 16  Temp: 98.3 F (36.8 C)  SpO2: 100%    Constitutional: Alert and oriented.  Eyes: Conjunctivae are normal.  Head: Atraumatic. Nose: No congestion/rhinnorhea. Mouth/Throat: Mucous membranes are moist.   Neck: No stridor. Painless ROM.  Cardiovascular: Normal rate, regular rhythm. Grossly normal heart sounds.  Good peripheral circulation. Respiratory: Normal respiratory effort.  No retractions. Lungs CTAB. Gastrointestinal: Soft and nontender. No distention. No abdominal bruits. No CVA tenderness. Genitourinary: deferred Musculoskeletal: No lower extremity tenderness nor edema.  No joint effusions. Neurologic:  Normal speech and language. No gross focal neurologic deficits are appreciated. No facial droop Skin:  Skin is warm, dry and intact. No rash noted. Psychiatric: Mood and affect are normal. Speech and behavior are normal.  ____________________________________________   LABS (all labs ordered are listed, but only abnormal results are displayed)  Results for orders placed or performed during the hospital encounter of 02/16/19 (from the past 24 hour(s))  Lipase, blood     Status: None   Collection Time: 02/16/19  2:27 PM  Result Value Ref Range   Lipase  23 11 - 51 U/L  Comprehensive metabolic panel     Status: Abnormal   Collection Time: 02/16/19  2:27 PM  Result Value Ref Range   Sodium 140 135 - 145 mmol/L   Potassium 3.8 3.5 - 5.1 mmol/L   Chloride 107 98 - 111 mmol/L   CO2 24 22 - 32 mmol/L   Glucose, Bld 100 (H) 70 - 99 mg/dL   BUN 9 6 - 20 mg/dL   Creatinine, Ser 4.090.94 0.44 -  1.00 mg/dL   Calcium 9.2 8.9 - 81.110.3 mg/dL   Total Protein 8.0 6.5 - 8.1 g/dL   Albumin 4.2 3.5 - 5.0 g/dL   AST 19 15 - 41 U/L   ALT 13 0 - 44 U/L   Alkaline Phosphatase 62 38 - 126 U/L   Total Bilirubin 0.6 0.3 - 1.2 mg/dL   GFR calc non Af Amer >60 >60 mL/min   GFR calc Af Amer >60 >60 mL/min   Anion gap 9 5 - 15  CBC     Status: Abnormal   Collection Time: 02/16/19  2:27 PM  Result Value Ref Range   WBC 7.1 4.0 - 10.5 K/uL   RBC 5.59 (H) 3.87 - 5.11 MIL/uL   Hemoglobin 15.3 (H) 12.0 - 15.0 g/dL   HCT 91.445.6 78.236.0 - 95.646.0 %   MCV 81.6 80.0 - 100.0 fL   MCH 27.4 26.0 - 34.0 pg   MCHC 33.6 30.0 - 36.0 g/dL   RDW 21.312.8 08.611.5 - 57.815.5 %   Platelets 188 150 - 400 K/uL   nRBC 0.0 0.0 - 0.2 %  Urinalysis, Complete w Microscopic     Status: Abnormal   Collection Time: 02/16/19  2:27 PM  Result Value Ref Range   Color, Urine YELLOW (A) YELLOW   APPearance HAZY (A) CLEAR   Specific Gravity, Urine 1.014 1.005 - 1.030   pH 5.0 5.0 - 8.0   Glucose, UA NEGATIVE NEGATIVE mg/dL   Hgb urine dipstick NEGATIVE NEGATIVE   Bilirubin Urine NEGATIVE NEGATIVE   Ketones, ur NEGATIVE NEGATIVE mg/dL   Protein, ur NEGATIVE NEGATIVE mg/dL   Nitrite NEGATIVE NEGATIVE   Leukocytes,Ua NEGATIVE NEGATIVE   RBC / HPF 0-5 0 - 5 RBC/hpf   WBC, UA 0-5 0 - 5 WBC/hpf   Bacteria, UA FEW (A) NONE SEEN   Squamous Epithelial / LPF 0-5 0 - 5   Mucus PRESENT   Pregnancy, urine POC     Status: None   Collection Time: 02/16/19  2:39 PM  Result Value Ref Range   Preg Test, Ur NEGATIVE NEGATIVE  Wet prep, genital     Status: Abnormal   Collection Time: 02/16/19  3:58 PM  Result Value Ref Range   Yeast Wet Prep HPF POC NONE SEEN NONE SEEN   Trich, Wet Prep NONE SEEN NONE SEEN   Clue Cells Wet Prep HPF POC PRESENT (A) NONE SEEN   WBC, Wet Prep HPF POC FEW (A) NONE SEEN   Sperm NONE SEEN    ____________________________________________ ____________________________________________  RADIOLOGY    ____________________________________________   PROCEDURES  Procedure(s) performed:  Procedures    Critical Care performed: no ____________________________________________   INITIAL IMPRESSION / ASSESSMENT AND PLAN / ED COURSE  Pertinent labs & imaging results that were available during my care of the patient were reviewed by me and considered in my medical decision making (see chart for details).   DDX: uti, vaginosis, std, pid, gastritis, cholelithaisis, cholecystitis  Melrose NakayamaDajah Lengel is a  23 y.o. who presents to the ED with symptoms as described above.  Patient afebrile and hemodynamically stable.  She is well-appearing with benign abdominal exam.  I did recommend pelvic exam but patient is declined this stating that she feels like it is similar to when she is had BV in the past and doesn't not want pelvic exam performed despite understanding it will limit our diagnostic abilities..  She is not personally concern for STD but does agree to self swab.  This does not seem clinically consistent with torsion or ectopic.  Not consistent with appendicitis or biliary pathology.  Clinical Course as of Feb 15 1721  Wed Feb 16, 2019  1721 Patient does have evidence of clue cells.  States that she has had similar symptoms in the past will treat.  Symptoms epigastric discomfort significant improved after GI cocktail.  Suspect some gastritis.  Do not feel that further imaging clinically indicated.  She stable and appropriate for outpatient follow-up.   [PR]    Clinical Course User Index [PR] Willy Eddy, MD    The patient was evaluated in Emergency Department today for the symptoms described in the history of present illness. He/she was evaluated in the context of the global COVID-19 pandemic, which necessitated consideration that the patient might be at risk for infection with the SARS-CoV-2 virus that causes COVID-19. Institutional protocols and algorithms that pertain to the evaluation of  patients at risk for COVID-19 are in a state of rapid change based on information released by regulatory bodies including the CDC and federal and state organizations. These policies and algorithms were followed during the patient's care in the ED.  As part of my medical decision making, I reviewed the following data within the electronic MEDICAL RECORD NUMBER Nursing notes reviewed and incorporated, Labs reviewed, notes from prior ED visits and [place] Controlled Substance Database   ____________________________________________   FINAL CLINICAL IMPRESSION(S) / ED DIAGNOSES  Final diagnoses:  Epigastric pain  Bacterial vaginosis      NEW MEDICATIONS STARTED DURING THIS VISIT:  New Prescriptions   METRONIDAZOLE (FLAGYL) 500 MG TABLET    Take 1 tablet (500 mg total) by mouth 2 (two) times daily for 7 days.   OMEPRAZOLE (PRILOSEC) 40 MG CAPSULE    Take 1 capsule (40 mg total) by mouth daily.   ONDANSETRON (ZOFRAN) 4 MG TABLET    Take 1 tablet (4 mg total) by mouth daily as needed.     Note:  This document was prepared using Dragon voice recognition software and may include unintentional dictation errors.    Willy Eddy, MD 02/16/19 [phone]

## 2019-02-16 NOTE — ED Triage Notes (Signed)
Pt in via POV, reports intermittent, generalized, sharp abdominal pains x 2 days; reports some nausea, denies emesis/diarrhea.  Ambulatory to triage.  NAD noted at this time.

## 2019-02-19 LAB — GC/CHLAMYDIA PROBE AMP
Chlamydia trachomatis, NAA: NEGATIVE
Neisseria Gonorrhoeae by PCR: NEGATIVE

## 2019-05-13 ENCOUNTER — Encounter: Payer: Self-pay | Admitting: Emergency Medicine

## 2019-05-13 ENCOUNTER — Emergency Department
Admission: EM | Admit: 2019-05-13 | Discharge: 2019-05-13 | Disposition: A | Discharge status: Home / Self Care | Payer: BLUE CROSS/BLUE SHIELD | Attending: Emergency Medicine | Admitting: Emergency Medicine

## 2019-05-13 ENCOUNTER — Other Ambulatory Visit: Payer: Self-pay

## 2019-05-13 DIAGNOSIS — F17290 Nicotine dependence, other tobacco product, uncomplicated: Secondary | ICD-10-CM | POA: Insufficient documentation

## 2019-05-13 DIAGNOSIS — K2900 Acute gastritis without bleeding: Secondary | ICD-10-CM | POA: Insufficient documentation

## 2019-05-13 DIAGNOSIS — Z79899 Other long term (current) drug therapy: Secondary | ICD-10-CM | POA: Diagnosis not present

## 2019-05-13 DIAGNOSIS — I10 Essential (primary) hypertension: Secondary | ICD-10-CM | POA: Insufficient documentation

## 2019-05-13 DIAGNOSIS — R112 Nausea with vomiting, unspecified: Secondary | ICD-10-CM | POA: Diagnosis present

## 2019-05-13 LAB — COMPREHENSIVE METABOLIC PANEL
ALT: 18 U/L (ref 0–44)
AST: 23 U/L (ref 15–41)
Albumin: 4.7 g/dL (ref 3.5–5.0)
Alkaline Phosphatase: 78 U/L (ref 38–126)
Anion gap: 14 (ref 5–15)
BUN: 10 mg/dL (ref 6–20)
CO2: 18 mmol/L — ABNORMAL LOW (ref 22–32)
Calcium: 9.3 mg/dL (ref 8.9–10.3)
Chloride: 109 mmol/L (ref 98–111)
Creatinine, Ser: 0.86 mg/dL (ref 0.44–1.00)
GFR calc Af Amer: >60 mL/min (ref >60)
GFR calc non Af Amer: >60 mL/min (ref >60)
Glucose, Bld: 147 mg/dL — ABNORMAL HIGH (ref 70–99)
Potassium: 3.5 mmol/L (ref 3.5–5.1)
Sodium: 141 mmol/L (ref 135–145)
Total Bilirubin: 1.3 mg/dL — ABNORMAL HIGH (ref 0.3–1.2)
Total Protein: 8.7 g/dL — ABNORMAL HIGH (ref 6.5–8.1)

## 2019-05-13 LAB — CBC
HCT: 46 % (ref 36.0–46.0)
Hemoglobin: 15.8 g/dL — ABNORMAL HIGH (ref 12.0–15.0)
MCH: 27.5 pg (ref 26.0–34.0)
MCHC: 34.3 g/dL (ref 30.0–36.0)
MCV: 80.1 fL (ref 80.0–100.0)
Platelets: 252 10*3/uL (ref 150–400)
RBC: 5.74 MIL/uL — ABNORMAL HIGH (ref 3.87–5.11)
RDW: 12.6 % (ref 11.5–15.5)
WBC: 10.9 10*3/uL — ABNORMAL HIGH (ref 4.0–10.5)
nRBC: 0 % (ref 0.0–0.2)

## 2019-05-13 LAB — LIPASE, BLOOD: Lipase: 23 U/L (ref 11–51)

## 2019-05-13 MED ORDER — DICYCLOMINE HCL 10 MG PO CAPS: 10.0000 mg | ORAL_CAPSULE | Freq: Three times a day (TID) | ORAL | 0 refills | Status: DC | PRN

## 2019-05-13 MED ORDER — SODIUM CHLORIDE 0.9 % IV BOLUS
1000.0000 mL | Freq: Once | INTRAVENOUS | Status: AC
  Administered 2019-05-13: 1000 mL via INTRAVENOUS

## 2019-05-13 MED ORDER — SODIUM CHLORIDE 0.9% FLUSH
3.0000 mL | Freq: Once | INTRAVENOUS | Status: AC
  Administered 2019-05-13: 3 mL via INTRAVENOUS

## 2019-05-13 MED ORDER — FAMOTIDINE IN NACL 20-0.9 MG/50ML-% IV SOLN
20.0000 mg | Freq: Once | INTRAVENOUS | Status: AC
  Administered 2019-05-13: 20 mg via INTRAVENOUS
  Filled 2019-05-13: qty 50

## 2019-05-13 MED ORDER — FAMOTIDINE 20 MG PO TABS: 20.0000 mg | ORAL_TABLET | Freq: Two times a day (BID) | ORAL | 0 refills | Status: DC

## 2019-05-13 MED ORDER — ONDANSETRON HCL 4 MG/2ML IJ SOLN
4.0000 mg | Freq: Once | INTRAMUSCULAR | Status: AC
  Administered 2019-05-13: 4 mg via INTRAVENOUS
  Filled 2019-05-13: qty 2

## 2019-05-13 MED ORDER — METOCLOPRAMIDE HCL 5 MG/ML IJ SOLN
10.0000 mg | Freq: Once | INTRAMUSCULAR | Status: AC
  Administered 2019-05-13: 10 mg via INTRAVENOUS
  Filled 2019-05-13: qty 2

## 2019-05-13 MED ORDER — ONDANSETRON 8 MG PO TBDP: 8.0000 mg | ORAL_TABLET | Freq: Three times a day (TID) | ORAL | 0 refills | Status: DC | PRN

## 2019-05-13 MED ORDER — DICYCLOMINE HCL 10 MG PO CAPS
10.0000 mg | ORAL_CAPSULE | Freq: Once | ORAL | Status: AC
  Administered 2019-05-13: 10 mg via ORAL
  Filled 2019-05-13: qty 1

## 2019-05-13 NOTE — Discharge Instructions (Signed)
You should take the Pepcid twice daily for the next 1 to 2 weeks.  You may take the Zofran (ondansetron) as needed for nausea, and the Bentyl as needed for crampy abdominal pain.  Return to the ER for new, worsening, or persistent severe abdominal pain, vomiting, fever, weakness, or any other new or worsening symptoms that concern you.

## 2019-05-13 NOTE — ED Triage Notes (Signed)
Patient says she has alcohol poisoning.  Drank last night.  Says she has been sick since this am with vomiting.

## 2019-05-13 NOTE — ED Notes (Signed)
Signature pad in room not working- printed and had pt sign hard copy for discharge 

## 2019-05-13 NOTE — ED Provider Notes (Signed)
[facility] Emergency Department Provider Note ____________________________________________   First MD Initiated Contact with Patient 05/13/19 1222     (approximate)  I have reviewed the triage vital signs and the nursing notes.   HISTORY  Chief Complaint Emesis    HPI Kari Morales is a 24 y.o. female with PMH as noted below who presents with nausea vomiting, acute onset this morning, persistent course, and associated with crampy lower abdominal pain. The patient states the symptoms occurred after she drank heavily last night. She is not sure exactly how many drinks she had. She denies any diarrhea or fever.  Past Medical History:  Diagnosis Date  . Hypertension   . PCOS (polycystic ovarian syndrome)     There are no problems to display for this patient.   Past Surgical History:  Procedure Laterality Date  . GANGLION CYST EXCISION Left    x2    Prior to Admission medications   Medication Sig Start Date End Date Taking? Authorizing Provider  dicyclomine (BENTYL) 10 MG capsule Take 1 capsule (10 mg total) by mouth 3 (three) times daily as needed for up to 3 days for spasms. 05/13/19 05/16/19  Arta Silence, MD  famotidine (PEPCID) 20 MG tablet Take 1 tablet (20 mg total) by mouth 2 (two) times daily for 10 days. 05/13/19 05/23/19  Arta Silence, MD  omeprazole (PRILOSEC) 40 MG capsule Take 1 capsule (40 mg total) by mouth daily. 02/16/19 02/16/20  Merlyn Lot, MD  ondansetron (ZOFRAN ODT) 8 MG disintegrating tablet Take 1 tablet (8 mg total) by mouth every 8 (eight) hours as needed for nausea or vomiting. 05/13/19   Arta Silence, MD  ondansetron (ZOFRAN) 4 MG tablet Take 1 tablet (4 mg total) by mouth daily as needed. 02/16/19 02/16/20  Merlyn Lot, MD    Allergies Shrimp [shellfish allergy]  No family history on file.  Social History Social History   Tobacco Use  . Smoking status: Current Every Day Smoker   Types: Cigars  . Smokeless tobacco: Never Used  Substance Use Topics  . Alcohol use: Yes  . Drug use: Never    Review of Systems  Constitutional: No fever. Eyes: No redness. ENT: No sore throat. Cardiovascular: Denies chest pain. Respiratory: Denies shortness of breath. Gastrointestinal: Positive for vomiting. Genitourinary: Negative for flank pain. Musculoskeletal: Negative for back pain. Skin: Negative for rash. Neurological: Negative for headache.   ____________________________________________   PHYSICAL EXAM:  VITAL SIGNS: ED Triage Vitals  Enc Vitals Group     BP 05/13/19 1027 (!) 154/101     Pulse Rate 05/13/19 1027 98     Resp 05/13/19 1027 16     Temp 05/13/19 1027 98.1 F (36.7 C)     Temp Source 05/13/19 1027 Oral     SpO2 05/13/19 1027 100 %     Weight 05/13/19 1042 170 lb (77.1 kg)     Height 05/13/19 1042 5\' 4"  (1.626 m)     Head Circumference --      Peak Flow --      Pain Score 05/13/19 1041 10     Pain Loc --      Pain Edu? --      Excl. in [place]? --     Constitutional: Alert and oriented. Uncomfortable appearing but in no acute distress. Eyes: Conjunctivae are normal. No scleral icterus. Head: Atraumatic. Nose: No congestion/rhinnorhea. Mouth/Throat: Mucous membranes are slightly dry.   Neck: Normal range of motion.  Cardiovascular: Normal rate, regular rhythm.  Good peripheral circulation. Respiratory: Normal respiratory effort.  No retractions.  Gastrointestinal: Soft with mild bilateral lower quadrant discomfort but no focal tenderness. No distention.  Genitourinary: No flank tenderness. Musculoskeletal: Extremities warm and well perfused.  Neurologic:  Normal speech and language. No gross focal neurologic deficits are appreciated.  Skin:  Skin is warm and dry. No rash noted. Psychiatric: Mood and affect are normal. Speech and behavior are normal.  ____________________________________________   LABS (all labs ordered are listed, but  only abnormal results are displayed)  Labs Reviewed  COMPREHENSIVE METABOLIC PANEL - Abnormal; Notable for the following components:      Result Value   CO2 18 (*)    Glucose, Bld 147 (*)    Total Protein 8.7 (*)    Total Bilirubin 1.3 (*)    All other components within normal limits  CBC - Abnormal; Notable for the following components:   WBC 10.9 (*)    RBC 5.74 (*)    Hemoglobin 15.8 (*)    All other components within normal limits  LIPASE, BLOOD  URINALYSIS, COMPLETE (UACMP) WITH MICROSCOPIC  POC URINE PREG, ED   ____________________________________________  EKG   ____________________________________________  RADIOLOGY    ____________________________________________   PROCEDURES  Procedure(s) performed: No  Procedures  Critical Care performed: No ____________________________________________   INITIAL IMPRESSION / ASSESSMENT AND PLAN / ED COURSE  Pertinent labs & imaging results that were available during my care of the patient were reviewed by me and considered in my medical decision making (see chart for details).  24 year old female with PMH as noted above presents with nausea and vomiting as well as crampy abdominal pain after drinking heavily last night. The patient is concerned she has alcohol poisoning. On exam she is uncomfortable but not acutely ill-appearing. Her vital signs are normal except for mild hypertension. She has no focal abdominal tenderness.  Presentation is consistent with gastritis related to alcohol use. The lab work-up is reassuring. We will treat symptomatically with antiemetics, Pepcid, Bentyl, fluids and reassess.  ----------------------------------------- 4:12 PM on 05/13/2019 -----------------------------------------  The patient was feeling better after the fluids and Reglan.  She was tolerating p.o. and was stable for discharge.  I counseled her on the results of the work-up.  Patient did not provide a urine, however she  has no urinary symptoms or any findings to suggest she is pregnant.  Return precautions given, she expressed understanding.  ____________________________________________   FINAL CLINICAL IMPRESSION(S) / ED DIAGNOSES  Final diagnoses:  Non-intractable vomiting with nausea, unspecified vomiting type  Acute gastritis without hemorrhage, unspecified gastritis type      NEW MEDICATIONS STARTED DURING THIS VISIT:  Discharge Medication List as of 05/13/2019  3:10 PM    START taking these medications   Details  dicyclomine (BENTYL) 10 MG capsule Take 1 capsule (10 mg total) by mouth 3 (three) times daily as needed for up to 3 days for spasms., Starting Fri 05/13/2019, Until Mon 05/16/2019, Normal    famotidine (PEPCID) 20 MG tablet Take 1 tablet (20 mg total) by mouth 2 (two) times daily for 10 days., Starting Fri 05/13/2019, Until Mon 05/23/2019, Normal    ondansetron (ZOFRAN ODT) 8 MG disintegrating tablet Take 1 tablet (8 mg total) by mouth every 8 (eight) hours as needed for nausea or vomiting., Starting Fri 05/13/2019, Normal         Note:  This document was prepared using Dragon voice recognition software and may include unintentional dictation errors.    Dionne Bucy, MD  05/13/19 1612  

## 2019-08-23 ENCOUNTER — Ambulatory Visit: Payer: Self-pay

## 2019-09-25 ENCOUNTER — Other Ambulatory Visit: Payer: Self-pay

## 2019-09-25 ENCOUNTER — Emergency Department
Admission: EM | Admit: 2019-09-25 | Discharge: 2019-09-25 | Disposition: A | Discharge status: Home / Self Care | Payer: BLUE CROSS/BLUE SHIELD | Attending: Emergency Medicine | Admitting: Emergency Medicine

## 2019-09-25 DIAGNOSIS — S40261A Insect bite (nonvenomous) of right shoulder, initial encounter: Secondary | ICD-10-CM | POA: Diagnosis present

## 2019-09-25 DIAGNOSIS — Y9389 Activity, other specified: Secondary | ICD-10-CM | POA: Diagnosis not present

## 2019-09-25 DIAGNOSIS — F17290 Nicotine dependence, other tobacco product, uncomplicated: Secondary | ICD-10-CM | POA: Diagnosis not present

## 2019-09-25 DIAGNOSIS — I10 Essential (primary) hypertension: Secondary | ICD-10-CM | POA: Insufficient documentation

## 2019-09-25 DIAGNOSIS — Z79899 Other long term (current) drug therapy: Secondary | ICD-10-CM | POA: Diagnosis not present

## 2019-09-25 DIAGNOSIS — Y999 Unspecified external cause status: Secondary | ICD-10-CM | POA: Diagnosis not present

## 2019-09-25 DIAGNOSIS — T63441A Toxic effect of venom of bees, accidental (unintentional), initial encounter: Secondary | ICD-10-CM

## 2019-09-25 DIAGNOSIS — W57XXXA Bitten or stung by nonvenomous insect and other nonvenomous arthropods, initial encounter: Secondary | ICD-10-CM | POA: Diagnosis not present

## 2019-09-25 DIAGNOSIS — Y92810 Car as the place of occurrence of the external cause: Secondary | ICD-10-CM | POA: Diagnosis not present

## 2019-09-25 DIAGNOSIS — S40861A Insect bite (nonvenomous) of right upper arm, initial encounter: Secondary | ICD-10-CM | POA: Insufficient documentation

## 2019-09-25 MED ORDER — IBUPROFEN 600 MG PO TABS
600.0000 mg | ORAL_TABLET | Freq: Once | ORAL | Status: AC
  Administered 2019-09-25: 600 mg via ORAL
  Filled 2019-09-25: qty 1

## 2019-09-25 MED ORDER — DIPHENHYDRAMINE HCL 25 MG PO CAPS
50.0000 mg | ORAL_CAPSULE | Freq: Once | ORAL | Status: AC
  Administered 2019-09-25: 50 mg via ORAL
  Filled 2019-09-25: qty 2

## 2019-09-25 NOTE — ED Provider Notes (Signed)
[facility] Emergency Department Provider Note  ____________________________________________  Time seen: Approximately 7:11 PM  I have reviewed the triage vital signs and the nursing notes.   HISTORY  Chief Complaint Insect Bite    HPI Kari Morales is a 24 y.o. female who presents the emergency department complaining of 2 bee stings 1 to the shoulder and 1 to the right axillary region.  Patient states that she was getting into the car, did not realize that there was a bee in the car as she sat down she was stung.  She states initially she thought she had touched the hot seatbelt, however when it occurred again she turned around and saw the bee flying.  She has never been stung before.  She has no other complaints other than pain at the site of the sting.  No shortness of breath, nausea, emesis.  No hives or other rashes.         Past Medical History:  Diagnosis Date  . Hypertension   . PCOS (polycystic ovarian syndrome)     Patient Active Problem List   Diagnosis Date Noted  . Migraine headache 12/25/2017    Past Surgical History:  Procedure Laterality Date  . GANGLION CYST EXCISION Left    x2    Prior to Admission medications   Medication Sig Start Date End Date Taking? Authorizing Provider  dicyclomine (BENTYL) 10 MG capsule Take 1 capsule (10 mg total) by mouth 3 (three) times daily as needed for up to 3 days for spasms. 05/13/19 05/16/19  Dionne Bucy, MD  famotidine (PEPCID) 20 MG tablet Take 1 tablet (20 mg total) by mouth 2 (two) times daily for 10 days. 05/13/19 05/23/19  Dionne Bucy, MD  omeprazole (PRILOSEC) 40 MG capsule Take 1 capsule (40 mg total) by mouth daily. 02/16/19 02/16/20  Willy Eddy, MD  ondansetron (ZOFRAN ODT) 8 MG disintegrating tablet Take 1 tablet (8 mg total) by mouth every 8 (eight) hours as needed for nausea or vomiting. 05/13/19   Dionne Bucy, MD  ondansetron (ZOFRAN) 4 MG tablet Take 1 tablet  (4 mg total) by mouth daily as needed. 02/16/19 02/16/20  Willy Eddy, MD    Allergies Shrimp [shellfish allergy]  Family History  Problem Relation Age of Onset  . Hypertension Mother   . Cancer Paternal Grandfather   . Breast cancer Half-Sister     Social History Social History   Tobacco Use  . Smoking status: Current Every Day Smoker    Types: Cigars  . Smokeless tobacco: Never Used  Substance Use Topics  . Alcohol use: Yes  . Drug use: Never     Review of Systems  Constitutional: No fever/chills Eyes: No visual changes. No discharge ENT: No upper respiratory complaints. Cardiovascular: no chest pain. Respiratory: no cough. No SOB. Gastrointestinal: No abdominal pain.  No nausea, no vomiting.  No diarrhea.  No constipation. Musculoskeletal: Negative for musculoskeletal pain. Skin: Bee sting to the right shoulder Neurological: Negative for headaches, focal weakness or numbness. 10-point ROS otherwise negative.  ____________________________________________   PHYSICAL EXAM:  VITAL SIGNS: ED Triage Vitals [09/25/19 1813]  Enc Vitals Group     BP (!) 139/106     Pulse Rate 98     Resp 18     Temp 98.4 F (36.9 C)     Temp Source Oral     SpO2 100 %     Weight 170 lb (77.1 kg)     Height 5\' 4"  (1.626 m)  Head Circumference      Peak Flow      Pain Score 8     Pain Loc      Pain Edu?      Excl. in [place]?      Constitutional: Alert and oriented. Well appearing and in no acute distress. Eyes: Conjunctivae are normal. PERRL. EOMI. Head: Atraumatic. ENT:      Ears:       Nose: No congestion/rhinnorhea.      Mouth/Throat: Mucous membranes are moist.  No angioedema Neck: No stridor.    Cardiovascular: Normal rate, regular rhythm. Normal S1 and S2.  Good peripheral circulation. Respiratory: Normal respiratory effort without tachypnea or retractions. Lungs CTAB. Good air entry to the bases with no decreased or absent breath sounds. Musculoskeletal:  Full range of motion to all extremities. No gross deformities appreciated. Neurologic:  Normal speech and language. No gross focal neurologic deficits are appreciated.  Skin:  Skin is warm, dry and intact. No rash noted.  Visualization of the right shoulder reveals 2 erythematous, raised lesions consistent with bee stings 1 noted to the posterior shoulder and 1 to the right axillary region.  No other hives, wheals identified. Psychiatric: Mood and affect are normal. Speech and behavior are normal. Patient exhibits appropriate insight and judgement.   ____________________________________________   LABS (all labs ordered are listed, but only abnormal results are displayed)  Labs Reviewed - No data to display ____________________________________________  EKG   ____________________________________________  RADIOLOGY   No results found.  ____________________________________________    PROCEDURES  Procedure(s) performed:    Procedures    Medications  diphenhydrAMINE (BENADRYL) capsule 50 mg (has no administration in time range)     ____________________________________________   INITIAL IMPRESSION / ASSESSMENT AND PLAN / ED COURSE  Pertinent labs & imaging results that were available during my care of the patient were reviewed by me and considered in my medical decision making (see chart for details).  Review of the [place] CSRS was performed in accordance of the [place] prior to dispensing any controlled drugs.           Patient's diagnosis is consistent with bee sting to the shoulder.  Patient presented to emergency department after being stung twice by a bee to the right shoulder and axillary region.  No history of allergic reactions to bee stings but patient states that as far she can remember this is the first time being stung.  Patient did not take any medications prior to arrival.  No evidence of systemic reaction.  Patient is given Motrin and Benadryl here in the  emergency department.  Patient is stable for discharge.  No prescriptions at this time. Patient is given ED precautions to return to the ED for any worsening or new symptoms.     ____________________________________________  FINAL CLINICAL IMPRESSION(S) / ED DIAGNOSES  Final diagnoses:  Bee sting, accidental or unintentional, initial encounter      NEW MEDICATIONS STARTED DURING THIS VISIT:  ED Discharge Orders    None          This chart was dictated using voice recognition software/Dragon. Despite best efforts to proofread, errors can occur which can change the meaning. Any change was purely unintentional.    Darletta Moll, PA-C 09/25/19 1914    Nena Polio, MD 09/25/19 [phone]

## 2019-09-25 NOTE — ED Triage Notes (Signed)
Pt comes POV with two hornet stings on upper right shoulder. Stings are welted in appearance but no hives, trouble breathing. Pt reports pain intermittently shooting between the two stings.

## 2019-10-21 ENCOUNTER — Ambulatory Visit (LOCAL_COMMUNITY_HEALTH_CENTER): Payer: No Typology Code available for payment source | Admitting: Physician Assistant

## 2019-10-21 ENCOUNTER — Other Ambulatory Visit: Payer: Self-pay

## 2019-10-21 ENCOUNTER — Encounter: Payer: Self-pay | Admitting: Physician Assistant

## 2019-10-21 VITALS — BP 134/94 | Ht 65.0 in | Wt 171.0 lb

## 2019-10-21 DIAGNOSIS — Z3161 Procreative counseling and advice using natural family planning: Secondary | ICD-10-CM | POA: Diagnosis not present

## 2019-10-21 DIAGNOSIS — Z3009 Encounter for other general counseling and advice on contraception: Secondary | ICD-10-CM

## 2019-10-21 DIAGNOSIS — Z113 Encounter for screening for infections with a predominantly sexual mode of transmission: Secondary | ICD-10-CM

## 2019-10-21 LAB — WET PREP FOR TRICH, YEAST, CLUE
Trichomonas Exam: NEGATIVE
Yeast Exam: NEGATIVE

## 2019-10-21 LAB — PREGNANCY, URINE: Preg Test, Ur: NEGATIVE

## 2019-10-21 MED ORDER — THERA VITAL M PO TABS: 1.0000 | ORAL_TABLET | Freq: Every day | ORAL | 0 refills | Status: DC

## 2019-10-21 NOTE — Progress Notes (Signed)
Pt is here for physical and desires pregnancy. Pt does not desire birth control today. Pt reports last 3 months periods have been irregular. LMP 09/17/2019. Pt also reports history of PCOS. Pt desires to discuss with provider things that may help to get pregnant and reports that she has tried prenatal multivitamins but had issues with having diarrhea while taking them.

## 2019-10-21 NOTE — Progress Notes (Signed)
Family Planning Visit- Repeat Yearly Visit  Subjective:  Kari Morales is a 24 y.o. No obstetric history on file.  being seen today for an well woman visit and to discuss family planning options.    She is currently using none for pregnancy prevention. Patient reports she does  want a pregnancy in the next year. Patient  has Migraine headache on their problem list.  Chief Complaint  Patient presents with  . Contraception    PE and discuss NFP    Patient reports that she would like to get pregnant.  States that she has PCOS and has irregular periods, especially for the last 3 months.  Took a pregnancy test at home 10 days ago and it was negative.  Reports that she has started using "wisdom of the womb" tea about 5 months ago.  Last period was 09/17/2019 and her previous one was 07/25/2019.  Reports that she has had a thick vaginal discharge and irritation off and on so would like a screening today.    Patient denies changes to personal and family history today.    See flowsheet for other program required questions.   Body mass index is 28.46 kg/m. - Patient is eligible for diabetes screening based on BMI and age >35?  not applicable HA1C ordered? not applicable  Patient reports 1 of partners in last year. Desires STI screening?  Yes   Has patient been screened once for HCV in the past?  No  No results found for: HCVAB  Does the patient have current of drug use, have a partner with drug use, and/or has been incarcerated since last result? No  If yes-- Screen for HCV through [facility]   Does the patient meet criteria for HBV testing? No  Criteria:  -Household, sexual or needle sharing contact with HBV -History of drug use -HIV positive -Those with known Hep C   Health Maintenance Due  Topic Date Due  . Hepatitis C Screening  Never done  . COVID-19 Vaccine (1) Never done  . TETANUS/TDAP  Never done    Review of Systems  All other systems reviewed and are  negative.   The following portions of the patient's history were reviewed and updated as appropriate: allergies, current medications, past family history, past medical history, past social history, past surgical history and problem list. Problem list updated.  Objective:   Vitals:   10/21/19 1038  BP: (!) 134/94  Weight: 171 lb (77.6 kg)  Height: 5\' 5"  (1.651 m)    Physical Exam Vitals and nursing note reviewed.  Constitutional:      General: She is not in acute distress.    Appearance: Normal appearance. She is normal weight.  HENT:     Head: Normocephalic.  Eyes:     Conjunctiva/sclera: Conjunctivae normal.  Neck:     Thyroid: No thyroid mass, thyromegaly or thyroid tenderness.  Cardiovascular:     Rate and Rhythm: Normal rate and regular rhythm.  Pulmonary:     Effort: Pulmonary effort is normal.     Breath sounds: Normal breath sounds.  Abdominal:     Palpations: Abdomen is soft. There is no mass.     Tenderness: There is no abdominal tenderness. There is no guarding or rebound.  Genitourinary:    General: Normal vulva.     Rectum: Normal.     Comments: External genitalia/pubic area without nits, lice, edema, erythema, lesions and inguinal adenopathy. Vagina with normal mucosa and discharge. Cervix without visible lesions.  Uterus firm, mobile, nt, no masses, no CMT, no adnexal tenderness or fullness. Musculoskeletal:     Cervical back: Neck supple. No tenderness.  Lymphadenopathy:     Cervical: No cervical adenopathy.  Skin:    General: Skin is warm and dry.     Findings: No bruising, erythema, lesion or rash.  Neurological:     Mental Status: She is alert and oriented to person, place, and time.  Psychiatric:        Mood and Affect: Mood normal.        Behavior: Behavior normal.        Thought Content: Thought content normal.        Judgment: Judgment normal.       Assessment and Plan:  Kari Morales is a 24 y.o. female No obstetric history on file.  presenting to the [facility] Department for an yearly well woman exam/family planning visit  Contraception counseling: Reviewed all forms of birth control options in the tiered based approach. available including abstinence; over the counter/barrier methods; hormonal contraceptive medication including pill, patch, ring, injection,contraceptive implant, ECP; hormonal and nonhormonal IUDs; permanent sterilization options including vasectomy and the various tubal sterilization modalities. Risks, benefits, and typical effectiveness rates were reviewed.  Questions were answered.  Written information was also given to the patient to review.  Patient desires to get pregnant. She will follow up in  1 year and prn for surveillance.  She was told to call with any further questions, or with any concerns about this method of contraception.  Emphasized use of condoms 100% of the time for STI prevention.  Patient was not a candidate for ECP toay.   1. Encounter for counseling regarding contraception Counseled patient that due to PCOS and irregular periods, it could take her longer to get pregnant on her own.  Recommend that patient see PCP re:  Elevated BP. Enc to discuss with PCP/Gyn options to achieve pregnancy with help so she will know her options. Enc to continue to keep track of cycles to see if she can determine if she has a pattern. - Pregnancy, urine  2. Screening for STD (sexually transmitted disease) Await test results.  Counseled that RN will call if she needs to RTC for treatment once results are back. - WET PREP FOR TRICH, YEAST, CLUE - Chlamydia/Gonorrhea [place] [facility] - HIV [place] [facility] - Syphilis Serology, [place] [facility]  3. Counseling about natural family planning Reviewed with patient Natural Family Planning info and written copies given to patient. Enc to go over info with partner as well. Reviewed healthy habits for self and partner for healthy pregnancy and baby. - Multiple  Vitamins-Minerals (MULTIVITAMIN) tablet; Take 1 tablet by mouth daily.  Dispense: 100 tablet; Refill: 0     Return for 2-3 weeks for TR's, annual and PRN.  No future appointments.  Matt Holmes, PA

## 2019-10-21 NOTE — Progress Notes (Signed)
UPT is negative today. Wet mount reviewed and is negative today, so no treatment needed for wet mount per standing order. Pt received MVI's per pt request. Pt aware that if she starts having any issues with with taking MVI's that she will stop taking them and let us know, and pt states understanding. Natural Family Planning information packet given to pt per Sadie Haber, PA verbal order. Counseled pt per provider orders and pt states understanding. Provider orders completed.

## 2019-11-14 ENCOUNTER — Emergency Department
Admission: EM | Admit: 2019-11-14 | Discharge: 2019-11-14 | Disposition: A | Discharge status: Home / Self Care | Payer: BC Managed Care – PPO | Attending: Emergency Medicine | Admitting: Emergency Medicine

## 2019-11-14 ENCOUNTER — Other Ambulatory Visit: Payer: Self-pay

## 2019-11-14 DIAGNOSIS — L0291 Cutaneous abscess, unspecified: Secondary | ICD-10-CM

## 2019-11-14 DIAGNOSIS — L0201 Cutaneous abscess of face: Secondary | ICD-10-CM | POA: Diagnosis not present

## 2019-11-14 DIAGNOSIS — F17290 Nicotine dependence, other tobacco product, uncomplicated: Secondary | ICD-10-CM | POA: Diagnosis not present

## 2019-11-14 DIAGNOSIS — I10 Essential (primary) hypertension: Secondary | ICD-10-CM | POA: Diagnosis not present

## 2019-11-14 DIAGNOSIS — Z79899 Other long term (current) drug therapy: Secondary | ICD-10-CM | POA: Insufficient documentation

## 2019-11-14 DIAGNOSIS — N764 Abscess of vulva: Secondary | ICD-10-CM | POA: Diagnosis not present

## 2019-11-14 LAB — BASIC METABOLIC PANEL
Anion gap: 6 (ref 5–15)
BUN: 6 mg/dL (ref 6–20)
CO2: 27 mmol/L (ref 22–32)
Calcium: 8.8 mg/dL — ABNORMAL LOW (ref 8.9–10.3)
Chloride: 106 mmol/L (ref 98–111)
Creatinine, Ser: 0.97 mg/dL (ref 0.44–1.00)
GFR calc Af Amer: >60 mL/min (ref >60)
GFR calc non Af Amer: >60 mL/min (ref >60)
Glucose, Bld: 100 mg/dL — ABNORMAL HIGH (ref 70–99)
Potassium: 3.8 mmol/L (ref 3.5–5.1)
Sodium: 139 mmol/L (ref 135–145)

## 2019-11-14 LAB — CBC WITH DIFFERENTIAL/PLATELET
Abs Immature Granulocytes: 0.02 K/uL (ref 0.00–0.07)
Basophils Absolute: 0 K/uL (ref 0.0–0.1)
Basophils Relative: 0 %
Eosinophils Absolute: 0.2 K/uL (ref 0.0–0.5)
Eosinophils Relative: 2 %
HCT: 41.8 % (ref 36.0–46.0)
Hemoglobin: 14.4 g/dL (ref 12.0–15.0)
Immature Granulocytes: 0 %
Lymphocytes Relative: 28 %
Lymphs Abs: 2.1 K/uL (ref 0.7–4.0)
MCH: 27.9 pg (ref 26.0–34.0)
MCHC: 34.4 g/dL (ref 30.0–36.0)
MCV: 80.9 fL (ref 80.0–100.0)
Monocytes Absolute: 0.8 K/uL (ref 0.1–1.0)
Monocytes Relative: 11 %
Neutro Abs: 4.4 K/uL (ref 1.7–7.7)
Neutrophils Relative %: 59 %
Platelets: 169 K/uL (ref 150–400)
RBC: 5.17 MIL/uL — ABNORMAL HIGH (ref 3.87–5.11)
RDW: 12.5 % (ref 11.5–15.5)
WBC: 7.5 K/uL (ref 4.0–10.5)
nRBC: 0 % (ref 0.0–0.2)

## 2019-11-14 MED ORDER — SODIUM CHLORIDE 0.9 % IV SOLN
1.0000 g | Freq: Once | INTRAVENOUS | Status: AC
  Administered 2019-11-14: 1 g via INTRAVENOUS
  Filled 2019-11-14: qty 10

## 2019-11-14 MED ORDER — CEPHALEXIN 500 MG PO CAPS: 500.0000 mg | ORAL_CAPSULE | Freq: Three times a day (TID) | ORAL | 0 refills | Status: DC

## 2019-11-14 NOTE — ED Provider Notes (Signed)
[facility] Emergency Department Provider Note  ____________________________________________   First MD Initiated Contact with Patient 11/14/19 1104     (approximate)  I have reviewed the triage vital signs and the nursing notes.   HISTORY  Chief Complaint Abscess    HPI Kari Morales is a 24 y.o. female presents emergency department complaining of a abscess to the left side of her chin with a swollen node underneath her chin and swelling across the neck.  Also has a abscess on her labia.  History of PCOS and gets some skin abscesses due to the hair growth on her chin.  No fever or chills.  No drainage from the area although she did get some white pus out of it 2 days ago.  Symptoms have been ongoing for 3 days    Past Medical History:  Diagnosis Date  . Hypertension   . Migraine   . PCOS (polycystic ovarian syndrome)     Patient Active Problem List   Diagnosis Date Noted  . Migraine headache 12/25/2017    Past Surgical History:  Procedure Laterality Date  . ganglian cyst removal in left hand     ~2007 or 2008 per pt  . GANGLION CYST EXCISION Left    x2    Prior to Admission medications   Medication Sig Start Date End Date Taking? Authorizing Provider  cephALEXin (KEFLEX) 500 MG capsule Take 1 capsule (500 mg total) by mouth 3 (three) times daily. 11/14/19   Lemoine Goyne, Roselyn Bering, PA-C  dicyclomine (BENTYL) 10 MG capsule Take 1 capsule (10 mg total) by mouth 3 (three) times daily as needed for up to 3 days for spasms. 05/13/19 05/16/19  Dionne Bucy, MD  famotidine (PEPCID) 20 MG tablet Take 1 tablet (20 mg total) by mouth 2 (two) times daily for 10 days. 05/13/19 05/23/19  Dionne Bucy, MD  Multiple Vitamins-Minerals (MULTIVITAMIN) tablet Take 1 tablet by mouth daily. 10/21/19   Federico Flake, MD    Allergies Shrimp [shellfish allergy]  Family History  Problem Relation Age of Onset  . Hypertension Mother   . Cancer Paternal  Grandfather   . Prostate cancer Paternal Grandfather   . Breast cancer Half-Sister     Social History Social History   Tobacco Use  . Smoking status: Current Every Day Smoker    Types: E-cigarettes  . Smokeless tobacco: Never Used  Vaping Use  . Vaping Use: Every day  . Start date: 09/03/2019  Substance Use Topics  . Alcohol use: Yes    Comment: occassionally  . Drug use: Never    Review of Systems  Constitutional: No fever/chills Eyes: No visual changes. ENT: No sore throat.  No swelling in the mouth Respiratory: Denies cough or difficulty breathing Cardiovascular: Denies chest pain Genitourinary: Negative for dysuria. Musculoskeletal: Negative for back pain. Skin: Negative for rash.  Positive abscess Psychiatric: no mood changes,     ____________________________________________   PHYSICAL EXAM:  VITAL SIGNS: ED Triage Vitals  Enc Vitals Group     BP 11/14/19 1016 (!) 136/98     Pulse Rate 11/14/19 1016 79     Resp 11/14/19 1016 16     Temp 11/14/19 1016 98.5 F (36.9 C)     Temp Source 11/14/19 1016 Oral     SpO2 11/14/19 1016 100 %     Weight 11/14/19 1017 170 lb (77.1 kg)     Height 11/14/19 1017 5\' 4"  (1.626 m)     Head Circumference --  Peak Flow --      Pain Score 11/14/19 1016 5     Pain Loc --      Pain Edu? --      Excl. in GC? --     Constitutional: Alert and oriented. Well appearing and in no acute distress. Eyes: Conjunctivae are normal.  Head: Atraumatic. Nose: No congestion/rhinnorhea. Mouth/Throat: Mucous membranes are moist.  No swelling underneath the tongue Neck:  supple no lymphadenopathy noted Cardiovascular: Normal rate, regular rhythm. Heart sounds are normal Respiratory: Normal respiratory effort.  No retractions, lungs c t a  GU: Small swollen ingrown hair noted along the right labia, no drainage, area is not ready for an incision and drainage Musculoskeletal: FROM all extremities, warm and well perfused Neurologic:   Normal speech and language.  Skin:  Skin is warm, dry and intact.  Positive for abscess noted on the left side of her face and chin along with a swollen node underneath the chin Psychiatric: Mood and affect are normal. Speech and behavior are normal.  ____________________________________________   LABS (all labs ordered are listed, but only abnormal results are displayed)  Labs Reviewed  BASIC METABOLIC PANEL - Abnormal; Notable for the following components:      Result Value   Glucose, Bld 100 (*)    Calcium 8.8 (*)    All other components within normal limits  CBC WITH DIFFERENTIAL/PLATELET - Abnormal; Notable for the following components:   RBC 5.17 (*)    All other components within normal limits   ____________________________________________   ____________________________________________  RADIOLOGY    ____________________________________________   PROCEDURES  Procedure(s) performed: No  Procedures    ____________________________________________   INITIAL IMPRESSION / ASSESSMENT AND PLAN / ED COURSE  Pertinent labs & imaging results that were available during my care of the patient were reviewed by me and considered in my medical decision making (see chart for details).   Patient is a 23 year old female presents emergency department for abscess to the left side of her face with swelling and a abscess on her vaginal area.  See HPI  Physical exam is consistent with the same.  No swelling underneath the tongue although there is some swelling noted underneath the chin  DDx: Abscess, cellulitis, Ludwick's angina  CBC is normal, metabolic panel is normal  Patient was given Rocephin 1 g IV  Due to the patient not having any difficulty with her airway and no swelling noted around the trachea I feel that she is stable for discharge.  She was given strict instructions to return if worsening swelling across the chin that may press on the airway.  She is also given a  prescription for Keflex 500 3 times daily for 7 days.  She is to follow-up with her regular doctor as needed.  Return if worsening.  She is discharged stable condition.      As part of my medical decision making, I reviewed the following data within the electronic MEDICAL RECORD NUMBER Nursing notes reviewed and incorporated, Labs reviewed , Old chart reviewed, Notes from prior ED visits and [place] Controlled Substance Database  ____________________________________________   FINAL CLINICAL IMPRESSION(S) / ED DIAGNOSES  Final diagnoses:  Abscess      NEW MEDICATIONS STARTED DURING THIS VISIT:  New Prescriptions   CEPHALEXIN (KEFLEX) 500 MG CAPSULE    Take 1 capsule (500 mg total) by mouth 3 (three) times daily.     Note:  This document was prepared using Conservation officer, historic buildings and may  include unintentional dictation errors.    Faythe Ghee, PA-C 11/14/19 1235    Chesley Noon, MD 11/14/19 [phone]

## 2019-11-14 NOTE — ED Triage Notes (Signed)
Pt c/o abscess to her chin for the past 3 days, also c/o abscess to the labia for the past 2 days.

## 2019-11-14 NOTE — ED Notes (Signed)
See triage note  Presents with possible abscess area to left side of her chin  Noticed areas about 2-3 days ago  Also states she has another  area to her vaginal area

## 2019-11-14 NOTE — Discharge Instructions (Signed)
Follow-up with your regular doctor if not improving in 2 to 3 days.  Take antibiotic as prescribed.  Return emergency department worsening.

## 2020-02-17 ENCOUNTER — Encounter: Payer: Self-pay | Admitting: Emergency Medicine

## 2020-02-17 ENCOUNTER — Emergency Department
Admission: EM | Admit: 2020-02-17 | Discharge: 2020-02-17 | Disposition: A | Discharge status: Home / Self Care | Payer: BLUE CROSS/BLUE SHIELD | Attending: Emergency Medicine | Admitting: Emergency Medicine

## 2020-02-17 ENCOUNTER — Other Ambulatory Visit: Payer: Self-pay

## 2020-02-17 DIAGNOSIS — R1030 Lower abdominal pain, unspecified: Secondary | ICD-10-CM | POA: Insufficient documentation

## 2020-02-17 DIAGNOSIS — M549 Dorsalgia, unspecified: Secondary | ICD-10-CM | POA: Diagnosis not present

## 2020-02-17 DIAGNOSIS — I10 Essential (primary) hypertension: Secondary | ICD-10-CM | POA: Diagnosis not present

## 2020-02-17 DIAGNOSIS — R109 Unspecified abdominal pain: Secondary | ICD-10-CM

## 2020-02-17 DIAGNOSIS — F17290 Nicotine dependence, other tobacco product, uncomplicated: Secondary | ICD-10-CM | POA: Insufficient documentation

## 2020-02-17 LAB — URINALYSIS, COMPLETE (UACMP) WITH MICROSCOPIC
Bilirubin Urine: NEGATIVE
Glucose, UA: NEGATIVE mg/dL
Hgb urine dipstick: NEGATIVE
Ketones, ur: NEGATIVE mg/dL
Nitrite: NEGATIVE
Protein, ur: NEGATIVE mg/dL
Specific Gravity, Urine: 1.014 (ref 1.005–1.030)
pH: 5 (ref 5.0–8.0)

## 2020-02-17 LAB — COMPREHENSIVE METABOLIC PANEL
ALT: 15 U/L (ref 0–44)
AST: 21 U/L (ref 15–41)
Albumin: 4.1 g/dL (ref 3.5–5.0)
Alkaline Phosphatase: 65 U/L (ref 38–126)
Anion gap: 9 (ref 5–15)
BUN: 11 mg/dL (ref 6–20)
CO2: 24 mmol/L (ref 22–32)
Calcium: 9.2 mg/dL (ref 8.9–10.3)
Chloride: 105 mmol/L (ref 98–111)
Creatinine, Ser: 1.05 mg/dL — ABNORMAL HIGH (ref 0.44–1.00)
GFR, Estimated: >60 mL/min (ref >60)
Glucose, Bld: 111 mg/dL — ABNORMAL HIGH (ref 70–99)
Potassium: 3.8 mmol/L (ref 3.5–5.1)
Sodium: 138 mmol/L (ref 135–145)
Total Bilirubin: 0.7 mg/dL (ref 0.3–1.2)
Total Protein: 7.1 g/dL (ref 6.5–8.1)

## 2020-02-17 LAB — CHLAMYDIA/NGC RT PCR (ARMC ONLY)
Chlamydia Tr: NOT DETECTED
N gonorrhoeae: NOT DETECTED

## 2020-02-17 LAB — CBC
HCT: 44.9 % (ref 36.0–46.0)
Hemoglobin: 15 g/dL (ref 12.0–15.0)
MCH: 27.5 pg (ref 26.0–34.0)
MCHC: 33.4 g/dL (ref 30.0–36.0)
MCV: 82.4 fL (ref 80.0–100.0)
Platelets: 196 10*3/uL (ref 150–400)
RBC: 5.45 MIL/uL — ABNORMAL HIGH (ref 3.87–5.11)
RDW: 12.6 % (ref 11.5–15.5)
WBC: 8.1 10*3/uL (ref 4.0–10.5)
nRBC: 0 % (ref 0.0–0.2)

## 2020-02-17 LAB — WET PREP, GENITAL
Clue Cells Wet Prep HPF POC: NONE SEEN
Sperm: NONE SEEN
Trich, Wet Prep: NONE SEEN
Yeast Wet Prep HPF POC: NONE SEEN

## 2020-02-17 LAB — LIPASE, BLOOD: Lipase: 57 U/L — ABNORMAL HIGH (ref 11–51)

## 2020-02-17 LAB — POC URINE PREG, ED: Preg Test, Ur: NEGATIVE

## 2020-02-17 NOTE — ED Provider Notes (Signed)
[facility] Emergency Department Provider Note  Time seen: 9:14 AM  I have reviewed the triage vital signs and the nursing notes.   HISTORY  Chief Complaint Back Pain and Abdominal Pain   HPI Kari Morales is a 24 y.o. female with a past medical history of hypertension, migraines, presents to the emergency department for abdominal discomfort.  According to the patient over the past 5 days or so she has been experiencing pain mostly across her lower abdomen.   States nausea but denies any vomiting.  No diarrhea.  No fever cough or shortness of breath.  Patient does admit to recent significant alcohol use but states that is fairly typical.  Also states for the past week or so she has been experiencing increased vaginal discharge with a foul odor.  Thought the symptoms could be related to a possible UTI but does deny dysuria.    Past Medical History:  Diagnosis Date  . Hypertension   . Migraine   . PCOS (polycystic ovarian syndrome)     Patient Active Problem List   Diagnosis Date Noted  . Migraine headache 12/25/2017    Past Surgical History:  Procedure Laterality Date  . ganglian cyst removal in left hand     ~2007 or 2008 per pt  . GANGLION CYST EXCISION Left    x2    Prior to Admission medications   Medication Sig Start Date End Date Taking? Authorizing Provider  cephALEXin (KEFLEX) 500 MG capsule Take 1 capsule (500 mg total) by mouth 3 (three) times daily. 11/14/19   Fisher, Roselyn Bering, PA-C  dicyclomine (BENTYL) 10 MG capsule Take 1 capsule (10 mg total) by mouth 3 (three) times daily as needed for up to 3 days for spasms. 05/13/19 05/16/19  Dionne Bucy, MD  famotidine (PEPCID) 20 MG tablet Take 1 tablet (20 mg total) by mouth 2 (two) times daily for 10 days. 05/13/19 05/23/19  Dionne Bucy, MD  Multiple Vitamins-Minerals (MULTIVITAMIN) tablet Take 1 tablet by mouth daily. 10/21/19   Federico Flake, MD    Allergies  Allergen Reactions   . Shrimp [Shellfish Allergy] Anaphylaxis    Family History  Problem Relation Age of Onset  . Hypertension Mother   . Cancer Paternal Grandfather   . Prostate cancer Paternal Grandfather   . Breast cancer Half-Sister     Social History Social History   Tobacco Use  . Smoking status: Current Every Day Smoker    Types: E-cigarettes  . Smokeless tobacco: Never Used  Vaping Use  . Vaping Use: Every day  . Start date: 09/03/2019  Substance Use Topics  . Alcohol use: Yes    Comment: occassionally  . Drug use: Never    Review of Systems Constitutional: Negative for fever. Cardiovascular: Negative for chest pain. Respiratory: Negative for shortness of breath. Gastrointestinal: Moderate dull lower abdominal pain.  Positive for nausea but negative for vomiting or diarrhea Genitourinary: Negative for urinary compaints.  Positive for vaginal discharge. Musculoskeletal: Negative for musculoskeletal complaints Neurological: Negative for headache All other ROS negative  ____________________________________________   PHYSICAL EXAM:  VITAL SIGNS: ED Triage Vitals  Enc Vitals Group     BP 02/17/20 0734 133/90     Pulse Rate 02/17/20 0734 85     Resp 02/17/20 0734 16     Temp 02/17/20 0734 98.2 F (36.8 C)     Temp Source 02/17/20 0734 Oral     SpO2 02/17/20 0734 100 %     Weight 02/17/20  0734 166 lb (75.3 kg)     Height 02/17/20 0734 5\' 4"  (1.626 m)     Head Circumference --      Peak Flow --      Pain Score 02/17/20 0733 7     Pain Loc --      Pain Edu? --      Excl. in GC? --    Constitutional: Alert and oriented. Well appearing and in no distress. Eyes: Normal exam ENT      Head: Normocephalic and atraumatic.      Mouth/Throat: Mucous membranes are moist. Cardiovascular: Normal rate, regular rhythm.  Respiratory: Normal respiratory effort without tachypnea nor retractions. Breath sounds are clear  Gastrointestinal: Soft, mild suprapubic tenderness palpation  otherwise benign abdominal exam.  No upper abdominal tenderness. Musculoskeletal: Nontender with normal range of motion in all extremities. Neurologic:  Normal speech and language. No gross focal neurologic deficits Skin:  Skin is warm, dry and intact.  Psychiatric: Mood and affect are normal.  ____________________________________________   INITIAL IMPRESSION / ASSESSMENT AND PLAN / ED COURSE  Pertinent labs & imaging results that were available during my care of the patient were reviewed by me and considered in my medical decision making (see chart for details).   Patient presents to the emergency department for 4 or 5 days of lower abdominal discomfort she describes as a dull aching pain.  Also states vaginal discharge x1 week.  No dysuria.  No fever.  Patient's lab work is largely within normal limits including what appears to be a normal urinalysis.  Patient does have a slight lipase elevation but no upper abdominal pain and a completely benign upper abdominal exam.  Discussed at least temporary alcohol cessation.  We will perform a pelvic exam to further evaluate.  Patient agreeable to plan of care.   Patient's pelvic exam shows no significant findings.  No significant cervical motion tenderness.  Patient's wet prep is negative.  Patient does not believe she is at high risk for STDs and would like to hold off on treatment until her test results come back which I believe is appropriate given no significant discharge.  We will discharge patient home with PCP follow-up.  I discussed with the patient alcohol cessation.  Kari Morales was evaluated in Emergency Department on 02/17/2020 for the symptoms described in the history of present illness. She was evaluated in the context of the global COVID-19 pandemic, which necessitated consideration that the patient might be at risk for infection with the SARS-CoV-2 virus that causes COVID-19. Institutional protocols and algorithms that pertain to the  evaluation of patients at risk for COVID-19 are in a state of rapid change based on information released by regulatory bodies including the CDC and federal and state organizations. These policies and algorithms were followed during the patient's care in the ED.  ____________________________________________   FINAL CLINICAL IMPRESSION(S) / ED DIAGNOSES  Abdominal pain   02/19/2020, MD 02/17/20 1035

## 2020-02-17 NOTE — ED Triage Notes (Signed)
Pt to ED via POV c/o lower back and lower abdominal pain x 3 days. Pt states that she normally has these symptoms when she has a UTI but this morning she started having nausea. Pt states that when she puts pressure on her right leg it causes a lot of pain in the right lower back. Pt is in NAD.

## 2020-02-17 NOTE — Discharge Instructions (Addendum)
Please follow-up with your doctor regarding your abdominal discomfort.  Return to the emergency department for any fever, worsening abdominal pain or any other symptom personally concerning to yourself.  Also as we discussed please abstain from alcohol use over the next 2 weeks, drink plenty of fluids.

## 2020-05-25 ENCOUNTER — Emergency Department
Admission: EM | Admit: 2020-05-25 | Discharge: 2020-05-25 | Disposition: A | Discharge status: Home / Self Care | Payer: BLUE CROSS/BLUE SHIELD | Attending: Emergency Medicine | Admitting: Emergency Medicine

## 2020-05-25 ENCOUNTER — Other Ambulatory Visit: Payer: Self-pay

## 2020-05-25 DIAGNOSIS — R1013 Epigastric pain: Secondary | ICD-10-CM | POA: Diagnosis not present

## 2020-05-25 DIAGNOSIS — R5383 Other fatigue: Secondary | ICD-10-CM | POA: Diagnosis not present

## 2020-05-25 DIAGNOSIS — F17290 Nicotine dependence, other tobacco product, uncomplicated: Secondary | ICD-10-CM | POA: Insufficient documentation

## 2020-05-25 DIAGNOSIS — R1110 Vomiting, unspecified: Secondary | ICD-10-CM | POA: Insufficient documentation

## 2020-05-25 DIAGNOSIS — I10 Essential (primary) hypertension: Secondary | ICD-10-CM | POA: Insufficient documentation

## 2020-05-25 DIAGNOSIS — R1111 Vomiting without nausea: Secondary | ICD-10-CM

## 2020-05-25 LAB — URINALYSIS, COMPLETE (UACMP) WITH MICROSCOPIC
Bacteria, UA: NONE SEEN
Bilirubin Urine: NEGATIVE
Glucose, UA: NEGATIVE mg/dL
Ketones, ur: NEGATIVE mg/dL
Leukocytes,Ua: NEGATIVE
Nitrite: NEGATIVE
Protein, ur: 100 mg/dL — AB
Specific Gravity, Urine: 1.019 (ref 1.005–1.030)
pH: 5 (ref 5.0–8.0)

## 2020-05-25 LAB — CBC
HCT: 44.3 % (ref 36.0–46.0)
Hemoglobin: 14.9 g/dL (ref 12.0–15.0)
MCH: 27.2 pg (ref 26.0–34.0)
MCHC: 33.6 g/dL (ref 30.0–36.0)
MCV: 80.8 fL (ref 80.0–100.0)
Platelets: 235 10*3/uL (ref 150–400)
RBC: 5.48 MIL/uL — ABNORMAL HIGH (ref 3.87–5.11)
RDW: 12.7 % (ref 11.5–15.5)
WBC: 13.5 10*3/uL — ABNORMAL HIGH (ref 4.0–10.5)
nRBC: 0 % (ref 0.0–0.2)

## 2020-05-25 LAB — COMPREHENSIVE METABOLIC PANEL
ALT: 20 U/L (ref 0–44)
AST: 30 U/L (ref 15–41)
Albumin: 5 g/dL (ref 3.5–5.0)
Alkaline Phosphatase: 53 U/L (ref 38–126)
Anion gap: 14 (ref 5–15)
BUN: 14 mg/dL (ref 6–20)
CO2: 21 mmol/L — ABNORMAL LOW (ref 22–32)
Calcium: 9.7 mg/dL (ref 8.9–10.3)
Chloride: 105 mmol/L (ref 98–111)
Creatinine, Ser: 1 mg/dL (ref 0.44–1.00)
GFR, Estimated: >60 mL/min (ref >60)
Glucose, Bld: 116 mg/dL — ABNORMAL HIGH (ref 70–99)
Potassium: 4 mmol/L (ref 3.5–5.1)
Sodium: 140 mmol/L (ref 135–145)
Total Bilirubin: 1.3 mg/dL — ABNORMAL HIGH (ref 0.3–1.2)
Total Protein: 8.7 g/dL — ABNORMAL HIGH (ref 6.5–8.1)

## 2020-05-25 LAB — LIPASE, BLOOD: Lipase: 23 U/L (ref 11–51)

## 2020-05-25 LAB — PREGNANCY, URINE: Preg Test, Ur: NEGATIVE

## 2020-05-25 MED ORDER — ONDANSETRON 4 MG PO TBDP: 4.0000 mg | ORAL_TABLET | Freq: Three times a day (TID) | ORAL | 0 refills | Status: AC | PRN

## 2020-05-25 MED ORDER — FAMOTIDINE 20 MG PO TABS
40.0000 mg | ORAL_TABLET | Freq: Once | ORAL | Status: AC
  Administered 2020-05-25: 40 mg via ORAL
  Filled 2020-05-25: qty 2

## 2020-05-25 MED ORDER — SODIUM CHLORIDE 0.9 % IV BOLUS
1000.0000 mL | Freq: Once | INTRAVENOUS | Status: AC
  Administered 2020-05-25: 1000 mL via INTRAVENOUS

## 2020-05-25 MED ORDER — ONDANSETRON 4 MG PO TBDP
4.0000 mg | ORAL_TABLET | Freq: Once | ORAL | Status: AC
  Administered 2020-05-25: 4 mg via ORAL
  Filled 2020-05-25: qty 1

## 2020-05-25 NOTE — Discharge Instructions (Signed)
You can take Zofran up to 3 times daily for the next 5 days. Please stay hydrated at home.

## 2020-05-25 NOTE — ED Triage Notes (Signed)
Pt reports that she had a drink at her sister's home last pm and states that she doesn't usually drink but states another time in the past she drank and she also vomited and has stomach pain at that time, pt reports that soemething in her abd was inflamed last time she drank as well. Pt reports that she has vomited over 10 times today. Pt estimates drinking 6 shots and a drink, woke up this am at 0300 with vomiting

## 2020-05-25 NOTE — ED Provider Notes (Signed)
[facility]-EMERGENCY DEPARTMENT  ____________________________________________  Time seen: Approximately 6:53 PM  I have reviewed the triage vital signs and the nursing notes.   HISTORY  Chief Complaint Abdominal Pain   Historian Patient     HPI Kari Morales is a 25 y.o. female presents to the emergency department with epigastric abdominal pain and multiple episodes of vomiting since patient had 6 shots and a mixed drink last night at her sister's house.  Patient states that she woke up at approximately 3:00 in the morning with emesis and this felt sluggish and fatigued today.  She has been afebrile.  Patient reports that she is currently menstruating.   Past Medical History:  Diagnosis Date  . Hypertension   . Migraine   . PCOS (polycystic ovarian syndrome)      Immunizations up to date:  Yes.     Past Medical History:  Diagnosis Date  . Hypertension   . Migraine   . PCOS (polycystic ovarian syndrome)     Patient Active Problem List   Diagnosis Date Noted  . Migraine headache 12/25/2017    Past Surgical History:  Procedure Laterality Date  . ganglian cyst removal in left hand     ~2007 or 2008 per pt  . GANGLION CYST EXCISION Left    x2    Prior to Admission medications   Medication Sig Start Date End Date Taking? Authorizing Provider  ondansetron (ZOFRAN ODT) 4 MG disintegrating tablet Take 1 tablet (4 mg total) by mouth every 8 (eight) hours as needed for up to 5 days. 05/25/20 05/30/20 Yes Pia Mau M, PA-C  cephALEXin (KEFLEX) 500 MG capsule Take 1 capsule (500 mg total) by mouth 3 (three) times daily. 11/14/19   Fisher, Roselyn Bering, PA-C  dicyclomine (BENTYL) 10 MG capsule Take 1 capsule (10 mg total) by mouth 3 (three) times daily as needed for up to 3 days for spasms. 05/13/19 05/16/19  Dionne Bucy, MD  famotidine (PEPCID) 20 MG tablet Take 1 tablet (20 mg total) by mouth 2 (two) times daily for 10 days. 05/13/19 05/23/19  Dionne Bucy, MD   Multiple Vitamins-Minerals (MULTIVITAMIN) tablet Take 1 tablet by mouth daily. 10/21/19   Federico Flake, MD    Allergies Shrimp [shellfish allergy]  Family History  Problem Relation Age of Onset  . Hypertension Mother   . Cancer Paternal Grandfather   . Prostate cancer Paternal Grandfather   . Breast cancer Half-Sister     Social History Social History   Tobacco Use  . Smoking status: Current Every Day Smoker    Types: E-cigarettes  . Smokeless tobacco: Never Used  Vaping Use  . Vaping Use: Every day  . Start date: 09/03/2019  Substance Use Topics  . Alcohol use: Yes    Comment: occassionally  . Drug use: Never     Review of Systems  Constitutional: No fever/chills Eyes:  No discharge ENT: No upper respiratory complaints. Respiratory: no cough. No SOB/ use of accessory muscles to breath Gastrointestinal: Patient has emesis.  Musculoskeletal: Negative for musculoskeletal pain. Skin: Negative for rash, abrasions, lacerations, ecchymosis.    ____________________________________________   PHYSICAL EXAM:  VITAL SIGNS: ED Triage Vitals  Enc Vitals Group     BP 05/25/20 1522 125/86     Pulse Rate 05/25/20 1522 81     Resp 05/25/20 1522 16     Temp 05/25/20 1522 98.2 F (36.8 C)     Temp Source 05/25/20 1522 Oral     SpO2 05/25/20 1522 98 %  Weight 05/25/20 1523 165 lb (74.8 kg)     Height 05/25/20 1523 5\' 4"  (1.626 m)     Head Circumference --      Peak Flow --      Pain Score 05/25/20 1523 8     Pain Loc --      Pain Edu? --      Excl. in GC? --      Constitutional: Alert and oriented. Well appearing and in no acute distress. Eyes: Conjunctivae are normal. PERRL. EOMI. Head: Atraumatic. ENT:      Ears: TMs are pearly.       Nose: No congestion/rhinnorhea.      Mouth/Throat: Mucous membranes are moist.  Neck: No stridor.  No cervical spine tenderness to palpation.  Cardiovascular: Normal rate, regular rhythm. Normal S1 and S2.  Good  peripheral circulation. Respiratory: Normal respiratory effort without tachypnea or retractions. Lungs CTAB. Good air entry to the bases with no decreased or absent breath sounds Gastrointestinal: Bowel sounds x 4 quadrants. Soft and nontender to palpation. No guarding or rigidity. No distention. Musculoskeletal: Full range of motion to all extremities. No obvious deformities noted Neurologic:  Normal for age. No gross focal neurologic deficits are appreciated.  Skin:  Skin is warm, dry and intact. No rash noted. Psychiatric: Mood and affect are normal for age. Speech and behavior are normal.   ____________________________________________   LABS (all labs ordered are listed, but only abnormal results are displayed)  Labs Reviewed  COMPREHENSIVE METABOLIC PANEL - Abnormal; Notable for the following components:      Result Value   CO2 21 (*)    Glucose, Bld 116 (*)    Total Protein 8.7 (*)    Total Bilirubin 1.3 (*)    All other components within normal limits  CBC - Abnormal; Notable for the following components:   WBC 13.5 (*)    RBC 5.48 (*)    All other components within normal limits  URINALYSIS, COMPLETE (UACMP) WITH MICROSCOPIC - Abnormal; Notable for the following components:   Color, Urine YELLOW (*)    APPearance CLOUDY (*)    Hgb urine dipstick LARGE (*)    Protein, ur 100 (*)    All other components within normal limits  LIPASE, BLOOD  PREGNANCY, URINE   ____________________________________________  EKG   ____________________________________________  RADIOLOGY   No results found.  ____________________________________________    PROCEDURES  Procedure(s) performed:     Procedures     Medications  ondansetron (ZOFRAN-ODT) disintegrating tablet 4 mg (4 mg Oral Given 05/25/20 1904)  sodium chloride 0.9 % bolus 1,000 mL (1,000 mLs Intravenous New Bag/Given 05/25/20 1903)  famotidine (PEPCID) tablet 40 mg (40 mg Oral Given 05/25/20 1904)      ____________________________________________   INITIAL IMPRESSION / ASSESSMENT AND PLAN / ED COURSE  Pertinent labs & imaging results that were available during my care of the patient were reviewed by me and considered in my medical decision making (see chart for details).      Assessment and plan Emesis 25 year old female presents to the emergency department after multiple episodes of emesis started last night after patient had 6 shots and a mixed drink.  Vital signs are reassuring at triage.  On exam, abdomen was soft and nontender without guarding.  CBC indicated mild leukocytosis.  CMP was within reference range.  Urinalysis showed a large amount of blood consistent with menses.  Lipase was within reference range.  Patient felt much improved after Zofran and supplemental  fluids in the emergency department. She was discharged with Zofran. All patient questions were answered.    ____________________________________________  FINAL CLINICAL IMPRESSION(S) / ED DIAGNOSES  Final diagnoses:  Non-intractable vomiting without nausea, unspecified vomiting type      NEW MEDICATIONS STARTED DURING THIS VISIT:  ED Discharge Orders         Ordered    ondansetron (ZOFRAN ODT) 4 MG disintegrating tablet  Every 8 hours PRN        05/25/20 1944              This chart was dictated using voice recognition software/Dragon. Despite best efforts to proofread, errors can occur which can change the meaning. Any change was purely unintentional.     Orvil Feil, PA-C 05/25/20 1946    Phineas Semen, MD 05/25/20 [id number]

## 2020-07-03 ENCOUNTER — Emergency Department: Payer: BC Managed Care – PPO

## 2020-07-03 ENCOUNTER — Other Ambulatory Visit: Payer: Self-pay

## 2020-07-03 ENCOUNTER — Emergency Department
Admission: EM | Admit: 2020-07-03 | Discharge: 2020-07-03 | Disposition: A | Discharge status: Home / Self Care | Payer: BC Managed Care – PPO | Attending: Emergency Medicine | Admitting: Emergency Medicine

## 2020-07-03 DIAGNOSIS — N898 Other specified noninflammatory disorders of vagina: Secondary | ICD-10-CM | POA: Diagnosis present

## 2020-07-03 DIAGNOSIS — B373 Candidiasis of vulva and vagina: Secondary | ICD-10-CM | POA: Insufficient documentation

## 2020-07-03 DIAGNOSIS — I10 Essential (primary) hypertension: Secondary | ICD-10-CM | POA: Insufficient documentation

## 2020-07-03 DIAGNOSIS — B3731 Acute candidiasis of vulva and vagina: Secondary | ICD-10-CM

## 2020-07-03 DIAGNOSIS — F17290 Nicotine dependence, other tobacco product, uncomplicated: Secondary | ICD-10-CM | POA: Diagnosis not present

## 2020-07-03 DIAGNOSIS — R102 Pelvic and perineal pain: Secondary | ICD-10-CM

## 2020-07-03 LAB — BASIC METABOLIC PANEL
Anion gap: 7 (ref 5–15)
BUN: 8 mg/dL (ref 6–20)
CO2: 25 mmol/L (ref 22–32)
Calcium: 9.1 mg/dL (ref 8.9–10.3)
Chloride: 106 mmol/L (ref 98–111)
Creatinine, Ser: 0.92 mg/dL (ref 0.44–1.00)
GFR, Estimated: >60 mL/min (ref >60)
Glucose, Bld: 82 mg/dL (ref 70–99)
Potassium: 3.5 mmol/L (ref 3.5–5.1)
Sodium: 138 mmol/L (ref 135–145)

## 2020-07-03 LAB — WET PREP, GENITAL
Clue Cells Wet Prep HPF POC: NONE SEEN
Sperm: NONE SEEN
Trich, Wet Prep: NONE SEEN
Yeast Wet Prep HPF POC: NONE SEEN

## 2020-07-03 LAB — URINALYSIS, COMPLETE (UACMP) WITH MICROSCOPIC
Bacteria, UA: NONE SEEN
Bilirubin Urine: NEGATIVE
Glucose, UA: NEGATIVE mg/dL
Hgb urine dipstick: NEGATIVE
Ketones, ur: 5 mg/dL — AB
Leukocytes,Ua: NEGATIVE
Nitrite: NEGATIVE
Protein, ur: NEGATIVE mg/dL
Specific Gravity, Urine: 1.014 (ref 1.005–1.030)
pH: 5 (ref 5.0–8.0)

## 2020-07-03 LAB — CBC
HCT: 41.7 % (ref 36.0–46.0)
Hemoglobin: 14.1 g/dL (ref 12.0–15.0)
MCH: 28 pg (ref 26.0–34.0)
MCHC: 33.8 g/dL (ref 30.0–36.0)
MCV: 82.7 fL (ref 80.0–100.0)
Platelets: 176 10*3/uL (ref 150–400)
RBC: 5.04 MIL/uL (ref 3.87–5.11)
RDW: 13 % (ref 11.5–15.5)
WBC: 5.2 10*3/uL (ref 4.0–10.5)
nRBC: 0 % (ref 0.0–0.2)

## 2020-07-03 LAB — POC URINE PREG, ED: Preg Test, Ur: NEGATIVE

## 2020-07-03 LAB — CHLAMYDIA/NGC RT PCR (ARMC ONLY)
Chlamydia Tr: NOT DETECTED
N gonorrhoeae: NOT DETECTED

## 2020-07-03 MED ORDER — FLUCONAZOLE 150 MG PO TABS: 150.0000 mg | ORAL_TABLET | Freq: Every day | ORAL | 0 refills | Status: DC

## 2020-07-03 NOTE — ED Triage Notes (Signed)
Pt c/o lower abd cramping with thick white discharge and odor for the past couple of weeks, states she saw her GYN and prescribed medications without any test or exam

## 2020-07-03 NOTE — ED Notes (Signed)
See triage note  Presents with some nausea and vaginal discharge  States she also noticed foul smell  States she was seen by her OB/GYN  Placed on meds  States she did not do an exam

## 2020-07-03 NOTE — ED Provider Notes (Signed)
[facility] Emergency Department Provider Note   ____________________________________________   Event Date/Time   First MD Initiated Contact with Patient 07/03/20 [phone]     (approximate)  I have reviewed the triage vital signs and the nursing notes.   HISTORY  Chief Complaint Vaginal Discharge    HPI Kari Morales is a 25 y.o. female patient presents with lower abdominal cramping with thick white vaginal discharge.  Patient states this been old for a couple weeks.  Patient states she saw GYN 4 days ago and prescribed medication without any tests or exam.  Patient is currently taking Flagyl and Macrobid.  Patient said no relief of complaint.         Past Medical History:  Diagnosis Date  . Hypertension   . Migraine   . PCOS (polycystic ovarian syndrome)     Patient Active Problem List   Diagnosis Date Noted  . Migraine headache 12/25/2017    Past Surgical History:  Procedure Laterality Date  . ganglian cyst removal in left hand     ~2007 or 2008 per pt  . GANGLION CYST EXCISION Left    x2    Prior to Admission medications   Medication Sig Start Date End Date Taking? Authorizing Provider  fluconazole (DIFLUCAN) 150 MG tablet Take 1 tablet (150 mg total) by mouth daily. Take as directed. 07/03/20  Yes Joni Reining, PA-C  metFORMIN (GLUCOPHAGE) 500 MG tablet Take by mouth 2 (two) times daily with a meal.   Yes [provider]  metroNIDAZOLE (FLAGYL) 500 MG tablet Take 500 mg by mouth 3 (three) times daily.   Yes [provider]  nitrofurantoin (MACRODANTIN) 100 MG capsule Take 100 mg by mouth 4 (four) times daily.   Yes [provider]  Prenatal Multivit-Min-Fe-FA (PRE-NATAL PO) Take by mouth.   Yes [provider]    Allergies Shrimp [shellfish allergy]  Family History  Problem Relation Age of Onset  . Hypertension Mother   . Cancer Paternal Grandfather   . Prostate cancer Paternal Grandfather    . Breast cancer Half-Sister     Social History Social History   Tobacco Use  . Smoking status: Current Every Day Smoker    Types: E-cigarettes  . Smokeless tobacco: Never Used  Vaping Use  . Vaping Use: Every day  . Start date: 09/03/2019  Substance Use Topics  . Alcohol use: Yes    Comment: occassionally  . Drug use: Never    Review of Systems Constitutional: No fever/chills Eyes: No visual changes. ENT: No sore throat. Cardiovascular: Denies chest pain. Respiratory: Denies shortness of breath. Gastrointestinal: No abdominal pain.  No nausea, no vomiting.  No diarrhea.  No constipation. Genitourinary: Negative for dysuria.  PCOS.  Vaginal discharge. Musculoskeletal: Negative for back pain. Skin: Negative for rash. Neurological: Negative for headaches, focal weakness or numbness. Endocrine:  Hypertension. Allergic/Immunilogical: Shellfish  ____________________________________________   PHYSICAL EXAM:  VITAL SIGNS: ED Triage Vitals [07/03/20 0753]  Enc Vitals Group     BP (!) 137/105     Pulse Rate 82     Resp 17     Temp 97.7 F (36.5 C)     Temp Source Oral     SpO2 100 %     Weight 160 lb (72.6 kg)     Height 5\' 4"  (1.626 m)     Head Circumference      Peak Flow      Pain Score 5     Pain  Loc      Pain Edu?      Excl. in GC?     Constitutional: Alert and oriented. Well appearing and in no acute distress. Cardiovascular: Normal rate, regular rhythm. Grossly normal heart sounds.  Good peripheral circulation. Respiratory: Normal respiratory effort.  No retractions. Lungs CTAB. Gastrointestinal: Soft and nontender. No distention. No abdominal bruits. No CVA tenderness. Genitourinary: Chaperoned exam shows copious amount of vaginal discharge.  No cervical motion tenderness.  Cultures pending. Musculoskeletal: No lower extremity tenderness nor edema.  No joint effusions. Neurologic:  Normal speech and language. No gross focal neurologic deficits are  appreciated. No gait instability. Skin:  Skin is warm, dry and intact. No rash noted. Psychiatric: Mood and affect are normal. Speech and behavior are normal.  ____________________________________________   LABS (all labs ordered are listed, but only abnormal results are displayed)  Labs Reviewed  WET PREP, GENITAL - Abnormal; Notable for the following components:      Result Value   WBC, Wet Prep HPF POC FEW (*)    All other components within normal limits  URINALYSIS, COMPLETE (UACMP) WITH MICROSCOPIC - Abnormal; Notable for the following components:   Color, Urine YELLOW (*)    APPearance HAZY (*)    Ketones, ur 5 (*)    All other components within normal limits  CHLAMYDIA/NGC RT PCR ([facility] ONLY)  BASIC METABOLIC PANEL  CBC  POC URINE PREG, ED   ____________________________________________  EKG   ____________________________________________  RADIOLOGY I, Joni Reining, personally viewed and evaluated these images (plain radiographs) as part of my medical decision making, as well as reviewing the written report by the radiologist.  ED MD interpretation: Vaginal ultrasound is consistent with right ovarian cyst.  Official radiology report(s): US PELVIC COMPLETE WITH TRANSVAGINAL  Result Date: 07/03/2020 CLINICAL DATA:  Pelvic pain EXAM: TRANSABDOMINAL AND TRANSVAGINAL ULTRASOUND OF PELVIS TECHNIQUE: Both transabdominal and transvaginal ultrasound examinations of the pelvis were performed. Transabdominal technique was performed for global imaging of the pelvis including uterus, ovaries, adnexal regions, and pelvic cul-de-sac. It was necessary to proceed with endovaginal exam following the transabdominal exam to visualize the ovaries and endometrium. COMPARISON:  10/06/2018 FINDINGS: Uterus Measurements: 9 x 4 x 4 cm = volume: 70 mL. No fibroids or other mass visualized. Endometrium Thickness: 8 mm.  No focal abnormality visualized. Right ovary Measurements: 43 x 24 x  43 mm = volume: 23 mL. Cystic and echogenic mass with dense shadowing at the level of increased echogenicity, measuring up to 3.8 cm. Left ovary Measurements: 42 x 21 x 26 mm = volume: 12 mL. Normal appearance/no adnexal mass. Other findings No abnormal free fluid. IMPRESSION: Heterogeneous partially shadowing mass in the right ovary measuring up to 3.8 cm, likely a dermoid. Electronically Signed   By: Marnee Spring M.D.   On: 07/03/2020 10:53    ____________________________________________   PROCEDURES  Procedure(s) performed (including Critical Care):  Procedures   ____________________________________________   INITIAL IMPRESSION / ASSESSMENT AND PLAN / ED COURSE  As part of my medical decision making, I reviewed the following data within the electronic MEDICAL RECORD NUMBER         Patient presents with vaginal discharge with foul odor.  Patient saw GYN doctor 4 days ago was given prescription for Flagyl and Macrobid.  Discussed with patient that  antibiotics would hinder results of vaginal wet mount and urinalysis.  Patient ultrasound was consistent for right ovarian cyst.  Advised patient to continue Macrobid and Flagyl.  Will  prescribe Diflucan to take as directed.  Follow-up with treating OB/GYN doctor per schedule.     ____________________________________________   FINAL CLINICAL IMPRESSION(S) / ED DIAGNOSES  Final diagnoses:  Pelvic pain  Vaginal yeast infection     ED Discharge Orders         Ordered    fluconazole (DIFLUCAN) 150 MG tablet  Daily        07/03/20 1119          *Please note:  Julian Askin was evaluated in Emergency Department on 07/03/2020 for the symptoms described in the history of present illness. She was evaluated in the context of the global COVID-19 pandemic, which necessitated consideration that the patient might be at risk for infection with the SARS-CoV-2 virus that causes COVID-19. Institutional protocols and algorithms that pertain to  the evaluation of patients at risk for COVID-19 are in a state of rapid change based on information released by regulatory bodies including the CDC and federal and state organizations. These policies and algorithms were followed during the patient's care in the ED.  Some ED evaluations and interventions may be delayed as a result of limited staffing during and the pandemic.*   Note:  This document was prepared using Dragon voice recognition software and may include unintentional dictation errors.    Joni Reining, PA-C 07/03/20 1126    Gilles Chiquito, MD 07/03/20 [phone]

## 2020-07-03 NOTE — Discharge Instructions (Signed)
Continue previous antibiotics.  Take Diflucan as directed.  Follow-up with OB/GYN doctor with ultrasound results provided.  Return to ED if condition worsens.

## 2020-07-31 ENCOUNTER — Other Ambulatory Visit: Payer: Self-pay

## 2020-07-31 ENCOUNTER — Emergency Department
Admission: EM | Admit: 2020-07-31 | Discharge: 2020-07-31 | Disposition: A | Discharge status: Home / Self Care | Payer: BLUE CROSS/BLUE SHIELD | Attending: Emergency Medicine | Admitting: Emergency Medicine

## 2020-07-31 ENCOUNTER — Emergency Department: Payer: BLUE CROSS/BLUE SHIELD

## 2020-07-31 DIAGNOSIS — L02415 Cutaneous abscess of right lower limb: Secondary | ICD-10-CM | POA: Insufficient documentation

## 2020-07-31 DIAGNOSIS — A499 Bacterial infection, unspecified: Secondary | ICD-10-CM | POA: Diagnosis not present

## 2020-07-31 DIAGNOSIS — N760 Acute vaginitis: Secondary | ICD-10-CM | POA: Insufficient documentation

## 2020-07-31 DIAGNOSIS — E119 Type 2 diabetes mellitus without complications: Secondary | ICD-10-CM | POA: Diagnosis not present

## 2020-07-31 DIAGNOSIS — Z7984 Long term (current) use of oral hypoglycemic drugs: Secondary | ICD-10-CM | POA: Insufficient documentation

## 2020-07-31 DIAGNOSIS — B9689 Other specified bacterial agents as the cause of diseases classified elsewhere: Secondary | ICD-10-CM

## 2020-07-31 DIAGNOSIS — F17290 Nicotine dependence, other tobacco product, uncomplicated: Secondary | ICD-10-CM | POA: Diagnosis not present

## 2020-07-31 DIAGNOSIS — N898 Other specified noninflammatory disorders of vagina: Secondary | ICD-10-CM | POA: Diagnosis present

## 2020-07-31 DIAGNOSIS — I10 Essential (primary) hypertension: Secondary | ICD-10-CM | POA: Diagnosis not present

## 2020-07-31 DIAGNOSIS — L0291 Cutaneous abscess, unspecified: Secondary | ICD-10-CM

## 2020-07-31 LAB — POC URINE PREG, ED: Preg Test, Ur: NEGATIVE

## 2020-07-31 LAB — WET PREP, GENITAL
Sperm: NONE SEEN
Trich, Wet Prep: NONE SEEN
Yeast Wet Prep HPF POC: NONE SEEN

## 2020-07-31 LAB — CHLAMYDIA/NGC RT PCR (ARMC ONLY)
Chlamydia Tr: NOT DETECTED
N gonorrhoeae: NOT DETECTED

## 2020-07-31 MED ORDER — ACETAMINOPHEN 500 MG PO TABS
1000.0000 mg | ORAL_TABLET | Freq: Once | ORAL | Status: AC
  Administered 2020-07-31: 1000 mg via ORAL
  Filled 2020-07-31: qty 2

## 2020-07-31 MED ORDER — METRONIDAZOLE 500 MG PO TABS
500.0000 mg | ORAL_TABLET | Freq: Once | ORAL | Status: AC
  Administered 2020-07-31: 500 mg via ORAL
  Filled 2020-07-31: qty 1

## 2020-07-31 MED ORDER — NAPROXEN 500 MG PO TABS
500.0000 mg | ORAL_TABLET | Freq: Once | ORAL | Status: AC
  Administered 2020-07-31: 500 mg via ORAL
  Filled 2020-07-31: qty 1

## 2020-07-31 MED ORDER — DOXYCYCLINE HYCLATE 100 MG PO TABS
100.0000 mg | ORAL_TABLET | Freq: Once | ORAL | Status: AC
  Administered 2020-07-31: 100 mg via ORAL
  Filled 2020-07-31: qty 1

## 2020-07-31 MED ORDER — METRONIDAZOLE 500 MG PO TABS: 500.0000 mg | ORAL_TABLET | Freq: Two times a day (BID) | ORAL | 0 refills | Status: AC

## 2020-07-31 MED ORDER — DOXYCYCLINE HYCLATE 100 MG PO TABS: 100.0000 mg | ORAL_TABLET | Freq: Two times a day (BID) | ORAL | 0 refills | Status: AC

## 2020-07-31 NOTE — ED Triage Notes (Signed)
Pt arrives to ER c/o of abscess to R groin. Upon inspection abscess noted to be a little red and swollen. Noted drainage. Pt states has noticed a knot to groin as well beside abscess. A&O, ambulatory. Denies fevers.

## 2020-07-31 NOTE — Discharge Instructions (Addendum)
As we discussed, you are being discharged with 2 different antibiotics to take at the same time for the next 7 days to treat both the abscess and bacterial vaginosis.  Metronidazole/Flagyl is the antibiotic that helps with bacterial vaginosis.  Do not drink alcohol while taking this medication.  No alcohol for the next week.  Doxycycline is the antibiotic that will treat the abscess in your leg.  This antibiotic makes you a little more sensitive to the sunlight and can cause sunburns more easily.  I would urge you to pick up a probiotic from the pharmacy when you get these medications.  Any generic probiotic will work, or you can just eat yogurt every day while taking these medications.  Please take Tylenol and ibuprofen/Advil for your pain.  It is safe to take them together, or to alternate them every few hours.  Take up to 1000mg  of Tylenol at a time, up to 4 times per day.  Do not take more than 4000 mg of Tylenol in 24 hours.  For ibuprofen, take 400-600 mg, 4-5 times per day.  If you develop any further worsening symptoms despite these medications, fevers, inability to use/feel your right leg, please return to the ED.  The gonorrhea and Chlamydia testing will take a few hours, and if this returns positive, you will get a phone call with this information and recommendations.  If you do not get a phone call, these tests are negative.

## 2020-07-31 NOTE — ED Notes (Signed)
Pt awake, alert and oriented x4.  No acute distress noted as pt sitting up in bed with boyfriend at bedside.  Pt displays pleasant affect and is calm and cooperate with staff.  RR even and unlabored on RA with symmetrical rise and fall of chest.  Pt displays upper medial R thigh abcess approx 19mm depth and 36mm width with pink wound bed; no drainage or odor noted - pt c/o 3/10 throbbing pain (Ibuprofen and Naproxen administered for pain - pt also started on PO ABX for abscess).  Ultrasound now at bedside for DVT rule out.  Pt denies any additional questions, concerns, needs at this time.  Will monitor for acute changes and maintain plan of care as results for vaginal self swabs are pending.

## 2020-07-31 NOTE — ED Notes (Signed)
Pt agreeable with d/c plan as discussed by provider.  This nurse has verbally reinforced d/c instructions and provided pt with written copy- pt acknowledges verbal understanding and denies any additional questions, concerns, needs.  Ambulatory at discharge independently with steady gait- no acute distress; pt escorted by her boyfriend home

## 2020-07-31 NOTE — ED Provider Notes (Signed)
[facility] Emergency Department Provider Note ____________________________________________   Event Date/Time   First MD Initiated Contact with Patient 07/31/20 1848     (approximate)  I have reviewed the triage vital signs and the nursing notes.  HISTORY  Chief Complaint Abscess   HPI Kari Morales is a 25 y.o. femalewho presents to the ED for evaluation of abscess.   Chart review indicates history of HTN, PCOS and migraine disorder.  DM on Metformin.  Patient is to the ED for evaluation of pain, swelling and discharge to her proximal right thigh, as well as vaginal discharge.  Patient reports noticing a boil to her proximal right inner thigh over the past 3 days, increasingly growing in size and becoming more painful.  She reports started draining 2 days ago and has been draining purulent discharge when she squeezes it.  She further reports swelling to her proximal right thigh and pain, she reports noticing a knot and concern for DVT.  She reports that she is not on antibiotics, but she found 4 tablets of a previous antibiotic that she cannot recall the name of and started taking 1 yesterday.  Further reports "a few days" of new vaginal discharge without vaginal bleeding or abdominal pain.  She was tolerating p.o. intake at her baseline without emesis, postprandial pain, diarrhea or stool changes.  Past Medical History:  Diagnosis Date  . Hypertension   . Migraine   . PCOS (polycystic ovarian syndrome)     Patient Active Problem List   Diagnosis Date Noted  . Migraine headache 12/25/2017    Past Surgical History:  Procedure Laterality Date  . ganglian cyst removal in left hand     ~2007 or 2008 per pt  . GANGLION CYST EXCISION Left    x2    Prior to Admission medications   Medication Sig Start Date End Date Taking? Authorizing Provider  doxycycline (VIBRA-TABS) 100 MG tablet Take 1 tablet (100 mg total) by mouth 2 (two) times daily for 7  days. 07/31/20 08/07/20 Yes Delton Prairie, MD  metroNIDAZOLE (FLAGYL) 500 MG tablet Take 1 tablet (500 mg total) by mouth 2 (two) times daily for 7 days. 07/31/20 08/07/20 Yes Delton Prairie, MD  fluconazole (DIFLUCAN) 150 MG tablet Take 1 tablet (150 mg total) by mouth daily. Take as directed. 07/03/20   Joni Reining, PA-C  metFORMIN (GLUCOPHAGE) 500 MG tablet Take by mouth 2 (two) times daily with a meal.    [provider]  metroNIDAZOLE (FLAGYL) 500 MG tablet Take 500 mg by mouth 3 (three) times daily.    [provider]  nitrofurantoin (MACRODANTIN) 100 MG capsule Take 100 mg by mouth 4 (four) times daily.    [provider]  Prenatal Multivit-Min-Fe-FA (PRE-NATAL PO) Take by mouth.    [provider]    Allergies Shrimp [shellfish allergy]  Family History  Problem Relation Age of Onset  . Hypertension Mother   . Cancer Paternal Grandfather   . Prostate cancer Paternal Grandfather   . Breast cancer Half-Sister     Social History Social History   Tobacco Use  . Smoking status: Current Every Day Smoker    Types: E-cigarettes  . Smokeless tobacco: Never Used  Vaping Use  . Vaping Use: Every day  . Start date: 09/03/2019  Substance Use Topics  . Alcohol use: Not Currently    Comment: occassionally  . Drug use: Never    Review of Systems  Constitutional: No fever/chills Eyes: No visual  changes. ENT: No sore throat. Cardiovascular: Denies chest pain. Respiratory: Denies shortness of breath. Gastrointestinal: No abdominal pain.  No nausea, no vomiting.  No diarrhea.  No constipation. Genitourinary: Negative for dysuria.  Positive for vaginal discharge. Musculoskeletal: Negative for back pain.  Positive for right inner thigh draining abscess and swelling Skin: Negative for rash. Neurological: Negative for headaches, focal weakness or numbness. ____________________________________________   PHYSICAL EXAM:  VITAL SIGNS: Vitals:    07/31/20 1839 07/31/20 2103  BP: (!) 143/103 (!) 125/102  Pulse: 88 69  Resp: 16 16  Temp: 98.3 F (36.8 C) 98.1 F (36.7 C)  SpO2: 100% 100%    Constitutional: Alert and oriented. Well appearing and in no acute distress.  Pleasant and conversational in full sentences.  Sitting up in the side of the bed and looks clinically well. Eyes: Conjunctivae are normal. PERRL. EOMI. Head: Atraumatic. Nose: No congestion/rhinnorhea. Mouth/Throat: Mucous membranes are moist.  Oropharynx non-erythematous. Neck: No stridor. No cervical spine tenderness to palpation. Cardiovascular: Normal rate, regular rhythm. Grossly normal heart sounds.  Good peripheral circulation. Respiratory: Normal respiratory effort.  No retractions. Lungs CTAB. Gastrointestinal: Soft , nondistended, nontender to palpation. No CVA tenderness. Musculoskeletal:  No joint effusions. No signs of acute trauma. To her proximal right inner thigh, perhaps 4 inches distal to her inguinal crease, is a draining lesion with about 5 mm opening.  Induration is present without fluctuance.  Unable to express any purulence.  No surrounding erythema. Some associated mild soft tissue swelling proximal to this headed up towards her inguinal crease and canal.  No palpable cord. Right leg is distally neurovascularly intact. Palpation of all 4 extremities otherwise no evidence of deformity, trauma or tenderness. Neurologic:  Normal speech and language. No gross focal neurologic deficits are appreciated. No gait instability noted. Skin:  Skin is warm, dry and intact. No rash noted. Psychiatric: Mood and affect are normal. Speech and behavior are normal. ____________________________________________   LABS (all labs ordered are listed, but only abnormal results are displayed)  Labs Reviewed  WET PREP, GENITAL - Abnormal; Notable for the following components:      Result Value   Clue Cells Wet Prep HPF POC PRESENT (*)    WBC, Wet Prep HPF POC FEW  (*)    All other components within normal limits  CHLAMYDIA/NGC RT PCR ([facility] ONLY)  POC URINE PREG, ED   ____________________________________________  RADIOLOGY  ED MD interpretation: DVT ultrasound reviewed by me without evidence of noncompressibility or DVT  Official radiology report(s): US Venous Img Lower Unilateral Right  Result Date: 07/31/2020 CLINICAL DATA:  Right leg pain and swelling. EXAM: Right LOWER EXTREMITY VENOUS DOPPLER ULTRASOUND TECHNIQUE: Gray-scale sonography with compression, as well as color and duplex ultrasound, were performed to evaluate the deep venous system(s) from the level of the common femoral vein through the popliteal and proximal calf veins. COMPARISON:  None. FINDINGS: VENOUS Normal compressibility of the common femoral, superficial femoral, and popliteal veins, as well as the visualized calf veins. Visualized portions of profunda femoral vein and great saphenous vein unremarkable. No filling defects to suggest DVT on grayscale or color Doppler imaging. Doppler waveforms show normal direction of venous flow, normal respiratory plasticity and response to augmentation. Limited views of the contralateral common femoral vein are unremarkable. OTHER Several small lymph nodes are noted in the right groin area likely reactive. Limitations: none IMPRESSION: Negative venous Doppler examination for right lower extremity DVT. Electronically Signed   By: Orlene Plum.D.  On: 07/31/2020 20:29    ____________________________________________   PROCEDURES and INTERVENTIONS  Procedure(s) performed (including Critical Care):  Procedures  Medications  acetaminophen (TYLENOL) tablet 1,000 mg (1,000 mg Oral Given 07/31/20 1955)  naproxen (NAPROSYN) tablet 500 mg (500 mg Oral Given 07/31/20 1955)  doxycycline (VIBRA-TABS) tablet 100 mg (100 mg Oral Given 07/31/20 1955)  metroNIDAZOLE (FLAGYL) tablet 500 mg (500 mg Oral Given 07/31/20 2102)     ____________________________________________   MDM / ED COURSE   25 year old woman with history of diabetes presents to the ED with already-draining abscess to her right proximal thigh, requiring initiation of antibiotics.  As well as new vaginal discharge necessitating wet prep and GC testing.  Exam is generally reassuring with normal vital signs and patient in no acute distress.  No neurologic or vascular deficits.  Do note some mild proximal soft tissue swelling to her right thigh, and she reports explicit concern for DVT.  We will provide nonnarcotic multimodal pain control, DVT rule out ultrasound, swab for gonorrhea, chlamydia and perform a wet prep.  Anticipate should be suitable for outpatient management with doxycycline to treat her abscess.  Treatment for her discharge pending vaginal swabs.  Wet prep with evidence of bacterial vaginosis as the likely source of her discharge.  We discussed pending GC testing and the possibility of coexisting pathologies.  She is requesting discharge, and we discussed follow-up nursing pool that we will call her if her GC testing returns positive.  She is in agreement.  Will discharge with doxycycline treat her abscess and Flagyl to treat BV.  We discussed the addition of probiotics to her regimen in the form of OTC generic probiotics or yogurt.  We discussed return precautions for the ED.   Clinical Course as of 07/31/20 2142  Tue Jul 31, 2020  2037 Updated patient on reassuring ultrasound and negative pregnancy test.  Boyfriend at the bedside.   [DS]    Clinical Course User Index [DS] Delton Prairie, MD    ____________________________________________   FINAL CLINICAL IMPRESSION(S) / ED DIAGNOSES  Final diagnoses:  Abscess  BV (bacterial vaginosis)     ED Discharge Orders         Ordered    metroNIDAZOLE (FLAGYL) 500 MG tablet  2 times daily        07/31/20 2041    doxycycline (VIBRA-TABS) 100 MG tablet  2 times daily        07/31/20  2041           Kari Morales   Note:  This document was prepared using Dragon voice recognition software and may include unintentional dictation errors.   Delton Prairie, MD 07/31/20 2145

## 2020-08-10 ENCOUNTER — Ambulatory Visit (INDEPENDENT_AMBULATORY_CARE_PROVIDER_SITE_OTHER): Payer: No Typology Code available for payment source | Admitting: Obstetrics and Gynecology

## 2020-08-10 ENCOUNTER — Other Ambulatory Visit: Payer: Self-pay

## 2020-08-10 ENCOUNTER — Encounter: Payer: Self-pay | Admitting: Obstetrics and Gynecology

## 2020-08-10 VITALS — BP 124/74 | Ht 64.0 in | Wt 160.0 lb

## 2020-08-10 DIAGNOSIS — N979 Female infertility, unspecified: Secondary | ICD-10-CM

## 2020-08-10 NOTE — Patient Instructions (Signed)
  If you are not already having monthly periods, I will send in a medication (provera, also called medroxyprogesterone). Take this medication daily for 10 days to force your body to have a period.   Day 1 (flow, not spotting): send me a message.  I will send in your medication sometime within the first three days. Days 3-7: Take the medication once daily. Days ~11-20:  Utilize at-home ovulation prediction kits.  Let me know which day you have your first positive test (this will allow me to estimate your cycle length) Day 21 (unless you ovulate much later than day 14): lab draw for progesterone level. This lets me know whether you ovulated during the cycle.  We are looking for a progesterone level of > 3.0 (ideally > 10.0), but 3.0 will work.  Day 35: if you have not had a period and you ovulated prior to day 21, take a home pregnancy test.  If negative, will send in Rx for Provera to induce another menses and cycle.  Day 1: repeat process above.    You can see how important timing is in the cycle and how important it is for [place] to communicate so that we can be lock-step in this process. Please feel free to ask any and all questions you have about this process. If something is unclear or doesn't make sense, I'm happy to clarify or adjust to any particular need you have.

## 2020-08-10 NOTE — Progress Notes (Signed)
Obstetrics & Gynecology Office Visit   Chief Complaint  Patient presents with  . Consult    Pt trying to get pregnant, but has PCOS   History of Present Illness: 25 y.o. G0 female who is ready to get pregnant.  She has been tracking her periods for about 2 years now. She states that she doesn't ovulate.  She states that she used to live in [place] and was having pain.  She had an ultrasound and had findings consistent with PCOS.  She went to the hospital recently and a dermoid that measured 3.8 cm.   She has been using ovulation test strips and they are never positive.  She has tried OTC methods, but these have been unsuccessful.  She has a regular period, every 28 days, that last for 5 days.  She might spot on day 6.  Her LMP was 07/19/2020- 07/20/2020.    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  She states that she no longer drinks any alcohol. She does vape.   She is taking metformin 500 mg daily.   She has been trying about a year (she has been serious). She has lost 30 pounds.  Her goal is to weigh 150 lbs.   She has a history of chlamydia and gonorrhea. This was treated. She doesn't believe she has had PID. Her partner has fathered a child who is now 11 years old.   Past Medical History:  Diagnosis Date  . Hypertension   . Migraine   . PCOS (polycystic ovarian syndrome)     Past Surgical History:  Procedure Laterality Date  . ganglian cyst removal in left hand     ~2007 or 2008 per pt  . GANGLION CYST EXCISION Left    x2    Gynecologic History: Patient's last menstrual period was 07/19/2020.  Obstetric History: G0  Family History  Problem Relation Age of Onset  . Hypertension Mother   . Cancer Paternal Grandfather   . Prostate cancer Paternal Grandfather   . Breast cancer Half-Sister     Social History   Socioeconomic History  . Marital status: Single    Spouse name: Not on file  . Number of children: Not on file  . Years of education: Not on file  . Highest  education level: Not on file  Occupational History  . Not on file  Tobacco Use  . Smoking status: Current Every Day Smoker    Types: E-cigarettes  . Smokeless tobacco: Never Used  Vaping Use  . Vaping Use: Every day  . Start date: 09/03/2019  Substance and Sexual Activity  . Alcohol use: Not Currently    Comment: occassionally  . Drug use: Never  . Sexual activity: Yes  Other Topics Concern  . Not on file  Social History Narrative  . Not on file   Social Determinants of Health   Financial Resource Strain: Not on file  Food Insecurity: Not on file  Transportation Needs: Not on file  Physical Activity: Not on file  Stress: Not on file  Social Connections: Not on file  Intimate Partner Violence: Not At Risk  . Fear of Current or Ex-Partner: No  . Emotionally Abused: No  . Physically Abused: No  . Sexually Abused: No    Allergies  Allergen Reactions  . Shrimp [Shellfish Allergy] Anaphylaxis    Prior to Admission medications   Medication Sig Start Date End Date Taking? Authorizing Provider  Prenatal Multivit-Min-Fe-FA (PRE-NATAL PO) Take by mouth.  Yes [provider]    Review of Systems  Constitutional: Negative.   HENT: Negative.   Eyes: Negative.   Respiratory: Negative.   Cardiovascular: Negative.   Gastrointestinal: Negative.   Genitourinary: Negative.   Musculoskeletal: Negative.   Skin: Negative.   Neurological: Negative.   Psychiatric/Behavioral: Negative.      Physical Exam BP 124/74   Ht 5\' 4"  (1.626 m)   Wt 160 lb (72.6 kg)   LMP 07/19/2020   BMI 27.46 kg/m  Patient's last menstrual period was 07/19/2020. Physical Exam Constitutional:      General: She is not in acute distress.    Appearance: Normal appearance.  HENT:     Head: Normocephalic and atraumatic.  Eyes:     General: No scleral icterus.    Conjunctiva/sclera: Conjunctivae normal.  Neurological:     General: No focal deficit present.     Mental Status: She is alert  and oriented to person, place, and time.     Cranial Nerves: No cranial nerve deficit.  Psychiatric:        Mood and Affect: Mood normal.        Behavior: Behavior normal.        Judgment: Judgment normal.     Female chaperone present for pelvic and breast  portions of the physical exam  Assessment: 25 y.o. No obstetric history on file. female here for  1. Infertility, female      Plan: Problem List Items Addressed This Visit   None   Visit Diagnoses    Infertility, female    -  Primary   Relevant Orders   Progesterone     We discussed the underlying etiologies which may be implicated in a couple experiencing difficulty conceiving.  The average couple will conceive within the span of 1 year with unprotected coitus, with a monthly fecundity rate of 20% or 1 in 5.  Even without further work up or intervention the patient and her partner may be successful in conceiving unassisted, although if an underlying etiology can be identified and addressed fecundity rate may improve.  The work up entails examining for ovulatory function, tubal patency, and ruling out female factor infertility.  These may be looked at concurrently or sequentially.  The downside of sequential work up is that this method may miss issues if more than one compartment is contributing.  She is aware that tubal factor or moderate to severe female factor infertility will require further consultation with a reproductive endocrinologist.  In the case of anovulation, use of Clomid (clomiphen citrate) or Femara (letrazole) were discussed with the understanding the the later is an off-label, but well supported use.  Current recommendation are to use letrozole first line secondary to increased live birth rate compared to clomid for patients with PCOS ("Polycystic Ovarian Syndrome" ACOG Practice Bulletin 194 June 2018).  With either of these drugs the risk of multiples increases from the standard population rate of 2% to approximately 10%,  with higher order multiples possible but unlikely.  Both drugs may require some time to titrate to the appropriate dosage to ensure consistent ovulation.  Cycles will be limited to 6 cycles on each drug secondary to decreasing rates of conception after 6 cycles.  In addition should patient be started on ovulation induction with Clomid she was advised to discontinue the drug for any vision changes as this is a rare but potentially permanent side-effect if medication is continued.  Was counseled that letrozole may cause idiopathic bone pain  that generally resolves.  Hot flashes, headaches, and nausea were mentioned as the most commonly encountered side-effects of both drugs.  We discussed timing of intercourse as well as the use of ovulation predictor kits identify the patient's fertile window each month.    Will check progesterone today.  She would like to try ovulation-induction methods. I'm ok with this. However, we discussed that if this does not work, an HSG would be the next step and her partner would likely need to get a semen analysis, as well.    A total of 33 minutes were spent face-to-face with the patient as well as preparation, review, communication, and documentation during this encounter.    Thomasene Mohair, MD 08/10/2020 10:34 AM

## 2020-08-11 LAB — PROGESTERONE: Progesterone: 1.6 ng/mL

## 2020-08-14 ENCOUNTER — Encounter: Payer: Self-pay | Admitting: Obstetrics and Gynecology

## 2020-08-28 ENCOUNTER — Other Ambulatory Visit: Payer: Self-pay | Admitting: Obstetrics and Gynecology

## 2020-08-28 DIAGNOSIS — N979 Female infertility, unspecified: Secondary | ICD-10-CM

## 2020-08-28 MED ORDER — CLOMIPHENE CITRATE 50 MG PO TABS: 50.0000 mg | ORAL_TABLET | Freq: Every day | ORAL | 0 refills | Status: DC

## 2020-09-13 ENCOUNTER — Ambulatory Visit (INDEPENDENT_AMBULATORY_CARE_PROVIDER_SITE_OTHER): Payer: No Typology Code available for payment source | Admitting: Obstetrics

## 2020-09-13 ENCOUNTER — Other Ambulatory Visit: Payer: BLUE CROSS/BLUE SHIELD

## 2020-09-13 ENCOUNTER — Encounter: Payer: Self-pay | Admitting: Obstetrics

## 2020-09-13 ENCOUNTER — Other Ambulatory Visit: Payer: Self-pay

## 2020-09-13 VITALS — BP 110/70 | Ht 64.0 in | Wt 157.4 lb

## 2020-09-13 DIAGNOSIS — R399 Unspecified symptoms and signs involving the genitourinary system: Secondary | ICD-10-CM | POA: Diagnosis not present

## 2020-09-13 DIAGNOSIS — N979 Female infertility, unspecified: Secondary | ICD-10-CM

## 2020-09-13 DIAGNOSIS — R300 Dysuria: Secondary | ICD-10-CM | POA: Diagnosis not present

## 2020-09-13 LAB — POCT URINALYSIS DIPSTICK
Bilirubin, UA: NEGATIVE
Blood, UA: NEGATIVE
Glucose, UA: NEGATIVE
Nitrite, UA: NEGATIVE
Protein, UA: NEGATIVE
Spec Grav, UA: 1.02 (ref 1.010–1.025)
Urobilinogen, UA: 0.2 E.U./dL
pH, UA: 8.5 — AB (ref 5.0–8.0)

## 2020-09-13 NOTE — Progress Notes (Signed)
Obstetrics & Gynecology Office Visit   Chief Complaint:  Chief Complaint  Patient presents with  . Gynecologic Exam    History of Present Illness: Kari Morales presents with c/o some pelvic /suprapubic pain that occurred several days ago on two occasions one evening. She had had IC with her spouse, then went to empty her bladder and had intense pain in her lower abdomen.She took some Ibuprofen, and applied a heating pad which helped.Today, she denies any dysuria or urgency. She is presently on Clomid, and is under Dr. Edison Pace care desirous of a pregnancy. She wants to be tested for a UTI.   Review of Systems:  Review of Systems  Constitutional: Negative.   Eyes: Negative.   Respiratory: Negative.   Cardiovascular: Negative.   Gastrointestinal: Negative.   Genitourinary:       Two episodes of lower abdominal pain/pressure several days ago.  Musculoskeletal: Negative.   Skin: Negative.   Neurological: Negative.   Endo/Heme/Allergies: Negative.   Psychiatric/Behavioral: Negative.      Past Medical History:  Past Medical History:  Diagnosis Date  . Hypertension   . Migraine   . PCOS (polycystic ovarian syndrome)     Past Surgical History:  Past Surgical History:  Procedure Laterality Date  . ganglian cyst removal in left hand     ~2007 or 2008 per pt  . GANGLION CYST EXCISION Left    x2    Gynecologic History: Patient's last menstrual period was 08/24/2020.  Obstetric History: No obstetric history on file.  Family History:  Family History  Problem Relation Age of Onset  . Hypertension Mother   . Cancer Paternal Grandfather   . Prostate cancer Paternal Grandfather   . Breast cancer Half-Sister     Social History:  Social History   Socioeconomic History  . Marital status: Single    Spouse name: Not on file  . Number of children: Not on file  . Years of education: Not on file  . Highest education level: Not on file  Occupational History  . Not on file   Tobacco Use  . Smoking status: Current Every Day Smoker    Types: E-cigarettes  . Smokeless tobacco: Never Used  Vaping Use  . Vaping Use: Every day  . Start date: 09/03/2019  Substance and Sexual Activity  . Alcohol use: Not Currently    Comment: occassionally  . Drug use: Never  . Sexual activity: Yes  Other Topics Concern  . Not on file  Social History Narrative  . Not on file   Social Determinants of Health   Financial Resource Strain: Not on file  Food Insecurity: Not on file  Transportation Needs: Not on file  Physical Activity: Not on file  Stress: Not on file  Social Connections: Not on file  Intimate Partner Violence: Not At Risk  . Fear of Current or Ex-Partner: No  . Emotionally Abused: No  . Physically Abused: No  . Sexually Abused: No    Allergies:  Allergies  Allergen Reactions  . Shrimp [Shellfish 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 Authorizing Provider  clomiPHENE (CLOMID) 50 MG tablet Take 1 tablet (50 mg total) by mouth daily. 08/28/20  Yes Conard Novak, MD  fluconazole (DIFLUCAN) 150 MG tablet Take 1 tablet (150 mg total) by mouth daily. Take as directed. Patient not taking: Reported on 08/10/2020 07/03/20   Joni Reining, PA-C  metFORMIN (GLUCOPHAGE) 500  MG tablet Take by mouth 2 (two) times daily with a meal. Patient not taking: Reported on 08/10/2020    [provider]  metroNIDAZOLE (FLAGYL) 500 MG tablet Take 500 mg by mouth 3 (three) times daily. Patient not taking: Reported on 08/10/2020    [provider]  nitrofurantoin (MACRODANTIN) 100 MG capsule Take 100 mg by mouth 4 (four) times daily. Patient not taking: Reported on 08/10/2020    [provider]  Prenatal Multivit-Min-Fe-FA (PRE-NATAL PO) Take by mouth.    [provider]    Physical Exam Vitals:  Vitals:   09/13/20 1614  BP: 110/70   Patient's last menstrual  period was 08/24/2020.  Physical Exam Vitals reviewed.  Constitutional:      Appearance: Normal appearance.  HENT:     Head: Normocephalic and atraumatic.     Nose: Nose normal.  Cardiovascular:     Rate and Rhythm: Normal rate and regular rhythm.  Pulmonary:     Effort: Pulmonary effort is normal.     Breath sounds: Normal breath sounds.  Abdominal:     Palpations: Abdomen is soft.  Genitourinary:    Comments: deferred Musculoskeletal:        General: Normal range of motion.  Skin:    General: Skin is warm and dry.  Neurological:     Mental Status: She is alert and oriented to person, place, and time.  Psychiatric:        Mood and Affect: Mood normal.        Behavior: Behavior normal.      Assessment: 25 y.o. No obstetric history on file.  UTI symptoms.   Plan: Problem List Items Addressed This Visit      Other   UTI symptoms    Other Visit Diagnoses    Dysuria    -  Primary   Relevant Orders   POCT Urinalysis Dipstick (Completed)   Urine Culture     Her urine shows trace Leuks, but is otherwise "clean" We discussed the possiblity that her uterus might have been cramping post IC. Will send urine for culture and treat based on symptoms.  Mirna Mires, CNM  09/13/2020 4:48 PM

## 2020-09-14 LAB — PROGESTERONE: Progesterone: 32.1 ng/mL

## 2020-09-16 ENCOUNTER — Encounter: Payer: Self-pay | Admitting: Obstetrics

## 2020-09-16 LAB — URINE CULTURE: Organism ID, Bacteria: NO GROWTH

## 2020-09-24 ENCOUNTER — Other Ambulatory Visit: Payer: Self-pay | Admitting: Obstetrics and Gynecology

## 2020-09-24 DIAGNOSIS — N979 Female infertility, unspecified: Secondary | ICD-10-CM

## 2020-09-24 MED ORDER — CLOMIPHENE CITRATE 50 MG PO TABS
50.0000 mg | ORAL_TABLET | Freq: Every day | ORAL | 0 refills | Status: DC
Start: 2020-09-24 — End: 2020-10-23

## 2020-10-15 ENCOUNTER — Other Ambulatory Visit: Payer: BLUE CROSS/BLUE SHIELD

## 2020-10-15 ENCOUNTER — Other Ambulatory Visit: Payer: Self-pay

## 2020-10-15 DIAGNOSIS — N979 Female infertility, unspecified: Secondary | ICD-10-CM

## 2020-10-16 LAB — PROGESTERONE: Progesterone: 22 ng/mL

## 2020-10-23 ENCOUNTER — Other Ambulatory Visit: Payer: Self-pay | Admitting: Obstetrics and Gynecology

## 2020-10-23 DIAGNOSIS — N979 Female infertility, unspecified: Secondary | ICD-10-CM

## 2020-10-23 MED ORDER — CLOMIPHENE CITRATE 50 MG PO TABS: 50.0000 mg | ORAL_TABLET | Freq: Every day | ORAL | 0 refills | Status: DC

## 2020-11-13 ENCOUNTER — Other Ambulatory Visit: Payer: Self-pay

## 2020-11-13 ENCOUNTER — Other Ambulatory Visit: Payer: BLUE CROSS/BLUE SHIELD

## 2020-11-13 DIAGNOSIS — N979 Female infertility, unspecified: Secondary | ICD-10-CM

## 2020-11-14 LAB — PROGESTERONE: Progesterone: 6.3 ng/mL

## 2020-11-17 ENCOUNTER — Other Ambulatory Visit: Payer: Self-pay

## 2020-11-17 DIAGNOSIS — N979 Female infertility, unspecified: Secondary | ICD-10-CM

## 2020-11-19 ENCOUNTER — Other Ambulatory Visit: Payer: Self-pay | Admitting: Obstetrics and Gynecology

## 2020-11-19 DIAGNOSIS — N979 Female infertility, unspecified: Secondary | ICD-10-CM

## 2020-11-19 MED ORDER — CLOMIPHENE CITRATE 50 MG PO TABS: 100.0000 mg | ORAL_TABLET | Freq: Every day | ORAL | 0 refills | Status: DC

## 2020-12-10 ENCOUNTER — Other Ambulatory Visit: Payer: BLUE CROSS/BLUE SHIELD

## 2020-12-10 ENCOUNTER — Other Ambulatory Visit: Payer: Self-pay

## 2020-12-10 DIAGNOSIS — N979 Female infertility, unspecified: Secondary | ICD-10-CM

## 2020-12-11 LAB — PROGESTERONE: Progesterone: 42.9 ng/mL

## 2020-12-17 ENCOUNTER — Telehealth: Payer: Self-pay

## 2020-12-17 NOTE — Progress Notes (Signed)
 SEEN BY ME ON 8/19 FOR LACERATION REPAIR OF FINGER HERE TO DAY FOR SUTURE REMOVAL NO ISSUES. HAS HEALED WELL. NO SIGNS OF INFECTION SUTURE REMOVED. BAND-AID APPLIED

## 2020-12-17 NOTE — Telephone Encounter (Signed)
Pt calling; found out 3d ago she was preg; texted SDJ but hasn't heard back; is cramping a little; is this okay?  Wanted to know before she goes to the ED.  [phone]  Pt's LMP is 11/17/20; pt states she burns during IC and after; thinks she may have a UTI; had a clumpy d/c that is now gone and just has the burning.  Adv cramping is normal throughout the whole preg as long as it doesn't double her over or stop her in her tracks.  Tx'd to [facility] for scheduling with a provider.

## 2020-12-17 NOTE — Progress Notes (Signed)
Pcp, No   Chief Complaint  Patient presents with   Vaginal Discharge    Vaginal discharge x 2 weeks and burning after sex.     HPI:      Ms. Samaira Holzworth is a 25 y.o. No obstetric history on file. whose LMP was Patient's last menstrual period was 11/17/2020., presents today for vaginal d/c for the past 2 wks. Initially with vaginal itching, no fishy odor, treated with monistat-1 last wk with sx relief. Now has burning during and after sex, but no other itching/burning otherwise. No vaginal dryness, not using lubricants. Still has vag d/c. No new partners. Partner uses Axe soap/bath and body works to wash. Has urinary frequency and urgency, with decreased flow recently. No LBP, pelvic pain, fevers.  She is newly pregnant with home UPTs, been doing clomid with Dr. Jean Rosenthal. Taking PNVs, has NOB appt 01/16/21 with [facility].  Pap due 9/22.    Past Medical History:  Diagnosis Date   Hypertension    Migraine    PCOS (polycystic ovarian syndrome)     Past Surgical History:  Procedure Laterality Date   ganglian cyst removal in left hand     ~2007 or 2008 per pt   GANGLION CYST EXCISION Left    x2    Family History  Problem Relation Age of Onset   Hypertension Mother    Cancer Paternal Grandfather    Prostate cancer Paternal Grandfather    Breast cancer Half-Sister     Social History   Socioeconomic History   Marital status: Single    Spouse name: Not on file   Number of children: Not on file   Years of education: Not on file   Highest education level: Not on file  Occupational History   Not on file  Tobacco Use   Smoking status: Every Day    Types: E-cigarettes   Smokeless tobacco: Never  Vaping Use   Vaping Use: Every day   Start date: 09/03/2019  Substance and Sexual Activity   Alcohol use: Not Currently    Comment: occassionally   Drug use: Never   Sexual activity: Yes  Other Topics Concern   Not on file  Social History Narrative   Not on file   Social  Determinants of Health   Financial Resource Strain: Not on file  Food Insecurity: Not on file  Transportation Needs: Not on file  Physical Activity: Not on file  Stress: Not on file  Social Connections: Not on file  Intimate Partner Violence: Not on file    Outpatient Medications Prior to Visit  Medication Sig Dispense Refill   clomiPHENE (CLOMID) 50 MG tablet Take 2 tablets (100 mg total) by mouth daily. 10 tablet 0   fluconazole (DIFLUCAN) 150 MG tablet Take 1 tablet (150 mg total) by mouth daily. Take as directed. (Patient not taking: Reported on 08/10/2020) 2 tablet 0   metFORMIN (GLUCOPHAGE) 500 MG tablet Take by mouth 2 (two) times daily with a meal. (Patient not taking: Reported on 08/10/2020)     metroNIDAZOLE (FLAGYL) 500 MG tablet Take 500 mg by mouth 3 (three) times daily. (Patient not taking: Reported on 08/10/2020)     nitrofurantoin (MACRODANTIN) 100 MG capsule Take 100 mg by mouth 4 (four) times daily. (Patient not taking: Reported on 08/10/2020)     Prenatal Multivit-Min-Fe-FA (PRE-NATAL PO) Take by mouth.     No facility-administered medications prior to visit.      ROS:  Review of Systems  Constitutional:  Negative for fever.  Gastrointestinal:  Negative for blood in stool, constipation, diarrhea, nausea and vomiting.  Genitourinary:  Positive for dysuria, frequency, urgency and vaginal discharge. Negative for dyspareunia, flank pain, hematuria, vaginal bleeding and vaginal pain.  Musculoskeletal:  Negative for back pain.  Skin:  Negative for rash.  BREAST: No symptoms   OBJECTIVE:   Vitals:  BP 110/70   Ht 5\' 4"  (1.626 m)   Wt 156 lb 6.4 oz (70.9 kg)   LMP 11/17/2020   BMI 26.85 kg/m   Physical Exam Vitals reviewed.  Constitutional:      Appearance: She is well-developed.  Pulmonary:     Effort: Pulmonary effort is normal.  Genitourinary:    General: Normal vulva.     Pubic Area: No rash.      Labia:        Right: No rash, tenderness or lesion.         Left: No rash, tenderness or lesion.      Vagina: Vaginal discharge present. No erythema or tenderness.     Cervix: Normal.     Uterus: Normal. Not enlarged and not tender.      Adnexa: Right adnexa normal and left adnexa normal.       Right: No mass or tenderness.         Left: No mass or tenderness.    Musculoskeletal:        General: Normal range of motion.     Cervical back: Normal range of motion.  Skin:    General: Skin is warm and dry.  Neurological:     General: No focal deficit present.     Mental Status: She is alert and oriented to person, place, and time.  Psychiatric:        Mood and Affect: Mood normal.        Behavior: Behavior normal.        Thought Content: Thought content normal.        Judgment: Judgment normal.    Results: Results for orders placed or performed in visit on 12/18/20 (from the past 24 hour(s))  POCT urine pregnancy     Status: Abnormal   Collection Time: 12/18/20  8:49 AM  Result Value Ref Range   Preg Test, Ur Positive (A) Negative  POC Urinalysis Dipstick OB     Status: None   Collection Time: 12/18/20  8:50 AM  Result Value Ref Range   Color, UA     Clarity, UA     Glucose, UA Negative Negative   Bilirubin, UA negative    Ketones, UA negative    Spec Grav, UA 1.015 1.010 - 1.025   Blood, UA negative    pH, UA 5.0 5.0 - 8.0   POC,PROTEIN,UA Negative Negative, Trace, Small (1+), Moderate (2+), Large (3+), 4+   Urobilinogen, UA 0.2 0.2 or 1.0 E.U./dL   Nitrite, UA negative    Leukocytes, UA Negative Negative   Appearance     Odor    POCT Wet Prep with KOH     Status: Normal   Collection Time: 12/18/20  9:19 AM  Result Value Ref Range   Trichomonas, UA Negative    Clue Cells Wet Prep HPF POC neg    Epithelial Wet Prep HPF POC     Yeast Wet Prep HPF POC negn    Bacteria Wet Prep HPF POC     RBC Wet Prep HPF POC     WBC Wet Prep HPF POC  KOH Prep POC Negative Negative     Assessment/Plan: Vaginal discharge - Plan:  POCT Wet Prep with KOH; pos sx and neg wet prep. Rule out STDs on pap. If neg, most likely normal with pregnancy.  Dyspareunia in female--burning during and after sex only. She and partner to change to dove sens skin soap in case it's irritant, add lubricants. If persist, can redo yeast vag tx. F/u prn sx.   UTI symptoms - Plan: POC Urinalysis Dipstick OB, Urine Culture; neg UA. Check C&S since pregnant. Will f/u if pos.   Missed period - Plan: POCT urine pregnancy; pos UPT. EDD 08/25/21. Cont PNVs.  Cervical cancer screening - Plan: Cytology - PAP  Screening for STD (sexually transmitted disease) - Plan: Cytology - PAP     Return if symptoms worsen or fail to improve.  Alain Deschene B. Zaccheaus Storlie, PA-C 12/18/2020 9:19 AM

## 2020-12-18 ENCOUNTER — Encounter: Payer: Self-pay | Admitting: Obstetrics and Gynecology

## 2020-12-18 ENCOUNTER — Other Ambulatory Visit (HOSPITAL_COMMUNITY)
Admission: RE | Admit: 2020-12-18 | Discharge: 2020-12-18 | Disposition: A | Discharge status: Home / Self Care | Payer: BLUE CROSS/BLUE SHIELD | Source: Ambulatory Visit | Attending: Obstetrics and Gynecology | Admitting: Obstetrics and Gynecology

## 2020-12-18 ENCOUNTER — Other Ambulatory Visit: Payer: Self-pay

## 2020-12-18 ENCOUNTER — Ambulatory Visit (INDEPENDENT_AMBULATORY_CARE_PROVIDER_SITE_OTHER): Payer: Self-pay | Admitting: Obstetrics and Gynecology

## 2020-12-18 VITALS — BP 110/70 | Ht 64.0 in | Wt 156.4 lb

## 2020-12-18 DIAGNOSIS — Z113 Encounter for screening for infections with a predominantly sexual mode of transmission: Secondary | ICD-10-CM | POA: Diagnosis present

## 2020-12-18 DIAGNOSIS — N898 Other specified noninflammatory disorders of vagina: Secondary | ICD-10-CM

## 2020-12-18 DIAGNOSIS — N926 Irregular menstruation, unspecified: Secondary | ICD-10-CM

## 2020-12-18 DIAGNOSIS — Z124 Encounter for screening for malignant neoplasm of cervix: Secondary | ICD-10-CM | POA: Diagnosis not present

## 2020-12-18 DIAGNOSIS — R399 Unspecified symptoms and signs involving the genitourinary system: Secondary | ICD-10-CM

## 2020-12-18 DIAGNOSIS — N941 Unspecified dyspareunia: Secondary | ICD-10-CM

## 2020-12-18 LAB — POCT URINALYSIS DIPSTICK OB
Bilirubin, UA: NEGATIVE
Blood, UA: NEGATIVE
Glucose, UA: NEGATIVE
Ketones, UA: NEGATIVE
Leukocytes, UA: NEGATIVE
Nitrite, UA: NEGATIVE
POC,PROTEIN,UA: NEGATIVE
Spec Grav, UA: 1.015 (ref 1.010–1.025)
Urobilinogen, UA: 0.2 E.U./dL
pH, UA: 5 (ref 5.0–8.0)

## 2020-12-18 LAB — POCT WET PREP WITH KOH
Clue Cells Wet Prep HPF POC: NEGATIVE
KOH Prep POC: NEGATIVE
Trichomonas, UA: NEGATIVE
Yeast Wet Prep HPF POC: NEGATIVE

## 2020-12-18 LAB — POCT URINE PREGNANCY: Preg Test, Ur: POSITIVE — AB

## 2020-12-18 NOTE — Patient Instructions (Signed)
I value your feedback and you entrusting us with your care. If you get a [facility] patient survey, I would appreciate you taking the time to let us know about your experience today. Thank you! ? ? ?

## 2020-12-19 LAB — CYTOLOGY - PAP
Chlamydia: NEGATIVE
Comment: NEGATIVE
Comment: NORMAL
Diagnosis: NEGATIVE
Neisseria Gonorrhea: NEGATIVE

## 2020-12-22 LAB — URINE CULTURE

## 2020-12-24 ENCOUNTER — Emergency Department
Admission: EM | Admit: 2020-12-24 | Discharge: 2020-12-25 | Disposition: A | Discharge status: Home / Self Care | Payer: 59 | Attending: Emergency Medicine | Admitting: Emergency Medicine

## 2020-12-24 ENCOUNTER — Other Ambulatory Visit: Payer: Self-pay

## 2020-12-24 ENCOUNTER — Emergency Department: Payer: 59

## 2020-12-24 DIAGNOSIS — R109 Unspecified abdominal pain: Secondary | ICD-10-CM

## 2020-12-24 DIAGNOSIS — I10 Essential (primary) hypertension: Secondary | ICD-10-CM | POA: Insufficient documentation

## 2020-12-24 DIAGNOSIS — Z3A01 Less than 8 weeks gestation of pregnancy: Secondary | ICD-10-CM | POA: Diagnosis not present

## 2020-12-24 DIAGNOSIS — M545 Low back pain, unspecified: Secondary | ICD-10-CM | POA: Diagnosis not present

## 2020-12-24 DIAGNOSIS — F17290 Nicotine dependence, other tobacco product, uncomplicated: Secondary | ICD-10-CM | POA: Insufficient documentation

## 2020-12-24 DIAGNOSIS — Z7984 Long term (current) use of oral hypoglycemic drugs: Secondary | ICD-10-CM | POA: Diagnosis not present

## 2020-12-24 DIAGNOSIS — O26891 Other specified pregnancy related conditions, first trimester: Secondary | ICD-10-CM | POA: Insufficient documentation

## 2020-12-24 DIAGNOSIS — R1030 Lower abdominal pain, unspecified: Secondary | ICD-10-CM

## 2020-12-24 DIAGNOSIS — R1084 Generalized abdominal pain: Secondary | ICD-10-CM | POA: Diagnosis not present

## 2020-12-24 LAB — CBC WITH DIFFERENTIAL/PLATELET
Abs Immature Granulocytes: 0.11 10*3/uL — ABNORMAL HIGH (ref 0.00–0.07)
Basophils Absolute: 0.1 10*3/uL (ref 0.0–0.1)
Basophils Relative: 0 %
Eosinophils Absolute: 0.2 10*3/uL (ref 0.0–0.5)
Eosinophils Relative: 1 %
HCT: 39.7 % (ref 36.0–46.0)
Hemoglobin: 14.2 g/dL (ref 12.0–15.0)
Immature Granulocytes: 1 %
Lymphocytes Relative: 18 %
Lymphs Abs: 3.1 10*3/uL (ref 0.7–4.0)
MCH: 29.1 pg (ref 26.0–34.0)
MCHC: 35.8 g/dL (ref 30.0–36.0)
MCV: 81.4 fL (ref 80.0–100.0)
Monocytes Absolute: 1.3 10*3/uL — ABNORMAL HIGH (ref 0.1–1.0)
Monocytes Relative: 8 %
Neutro Abs: 12.1 10*3/uL — ABNORMAL HIGH (ref 1.7–7.7)
Neutrophils Relative %: 72 %
Platelets: 204 10*3/uL (ref 150–400)
RBC: 4.88 MIL/uL (ref 3.87–5.11)
RDW: 12.8 % (ref 11.5–15.5)
WBC: 16.8 10*3/uL — ABNORMAL HIGH (ref 4.0–10.5)
nRBC: 0 % (ref 0.0–0.2)

## 2020-12-24 LAB — HCG, QUANTITATIVE, PREGNANCY: hCG, Beta Chain, Quant, S: 10426 m[IU]/mL — ABNORMAL HIGH (ref <5)

## 2020-12-24 MED ORDER — ONDANSETRON 4 MG PO TBDP
4.0000 mg | ORAL_TABLET | Freq: Once | ORAL | Status: AC
  Administered 2020-12-25: 4 mg via ORAL
  Filled 2020-12-24: qty 1

## 2020-12-24 MED ORDER — ACETAMINOPHEN 500 MG PO TABS
1000.0000 mg | ORAL_TABLET | Freq: Once | ORAL | Status: AC
  Administered 2020-12-25: 1000 mg via ORAL
  Filled 2020-12-24: qty 2

## 2020-12-24 NOTE — ED Triage Notes (Addendum)
C/o low abdominal cramping with right side and low back pain x4-5 days. Pt. Is approx [redacted] weeks pregnant. Denies vaginal bleeding, but having clear/white vaginal discharge. Denies any pain with urination.

## 2020-12-24 NOTE — ED Provider Notes (Signed)
[facility] Emergency Department Provider Note   ____________________________________________   Event Date/Time   First MD Initiated Contact with Patient 12/24/20 2152     (approximate)  I have reviewed the triage vital signs and the nursing notes.   HISTORY  Chief Complaint Abdominal Pain (C/o low abdominal cramping with right side and low back pain x4-5 days. Pt. Is approx [redacted] weeks pregnant. Denies vaginal bleeding, but having clear/white vaginal discharge. )    HPI Kari Morales is a 25 y.o. female, G1 P0 at approximately 5 weeks of pregnancy, presents to the ED complaining of abdominal pain.  Patient reports that she has had 4 to 5 days of intermittent cramping in her abdomen diffusely.  She has been dealing with similar pain in her lower back that seems to come and go on both sides.  She has not had any vaginal bleeding, does report some light whitish discharge.  She denies any fevers, dysuria, or flank pain.  She has an appointment scheduled with OB/GYN later this month, but has not yet had an ultrasound.        Past Medical History:  Diagnosis Date   Hypertension    Migraine    PCOS (polycystic ovarian syndrome)     Patient Active Problem List   Diagnosis Date Noted   UTI symptoms 09/13/2020   Migraine headache 12/25/2017    Past Surgical History:  Procedure Laterality Date   ganglian cyst removal in left hand     ~2007 or 2008 per pt   GANGLION CYST EXCISION Left    x2    Prior to Admission medications   Medication Sig Start Date End Date Taking? Authorizing Provider  clomiPHENE (CLOMID) 50 MG tablet Take 2 tablets (100 mg total) by mouth daily. 11/19/20   Conard Novak, MD  fluconazole (DIFLUCAN) 150 MG tablet Take 1 tablet (150 mg total) by mouth daily. Take as directed. Patient not taking: Reported on 08/10/2020 07/03/20   Joni Reining, PA-C  metFORMIN (GLUCOPHAGE) 500 MG tablet Take by mouth 2 (two) times daily with a  meal. Patient not taking: Reported on 08/10/2020    [provider]  metroNIDAZOLE (FLAGYL) 500 MG tablet Take 500 mg by mouth 3 (three) times daily. Patient not taking: Reported on 08/10/2020    [provider]  nitrofurantoin (MACRODANTIN) 100 MG capsule Take 100 mg by mouth 4 (four) times daily. Patient not taking: Reported on 08/10/2020    [provider]  Prenatal Multivit-Min-Fe-FA (PRE-NATAL PO) Take by mouth.    [provider]    Allergies Shrimp [shellfish allergy]  Family History  Problem Relation Age of Onset   Hypertension Mother    Cancer Paternal Grandfather    Prostate cancer Paternal Grandfather    Breast cancer Half-Sister     Social History Social History   Tobacco Use   Smoking status: Every Day    Types: E-cigarettes   Smokeless tobacco: Never  Vaping Use   Vaping Use: Every day   Start date: 09/03/2019  Substance Use Topics   Alcohol use: Not Currently    Comment: occassionally   Drug use: Never    Review of Systems  Constitutional: No fever/chills Eyes: No visual changes. ENT: No sore throat. Cardiovascular: Denies chest pain. Respiratory: Denies shortness of breath. Gastrointestinal: Positive for abdominal pain and nausea, no vomiting.  No diarrhea.  No constipation. Genitourinary: Negative for dysuria. Musculoskeletal: Negative for back pain. Skin: Negative for rash. Neurological: Negative for  headaches, focal weakness or numbness.  ____________________________________________   PHYSICAL EXAM:  VITAL SIGNS: ED Triage Vitals [12/24/20 2119]  Enc Vitals Group     BP (!) 158/107     Pulse Rate 95     Resp 16     Temp 98.4 F (36.9 C)     Temp Source Oral     SpO2 100 %     Weight      Height      Head Circumference      Peak Flow      Pain Score 7     Pain Loc      Pain Edu?      Excl. in GC?     Constitutional: Alert and oriented. Eyes: Conjunctivae are normal. Head: Atraumatic. Nose:  No congestion/rhinnorhea. Mouth/Throat: Mucous membranes are moist. Neck: Normal ROM Cardiovascular: Normal rate, regular rhythm. Grossly normal heart sounds.  2+ radial pulses bilaterally. Respiratory: Normal respiratory effort.  No retractions. Lungs CTAB. Gastrointestinal: Soft and nontender. No distention. Genitourinary: deferred Musculoskeletal: No lower extremity tenderness nor edema. Neurologic:  Normal speech and language. No gross focal neurologic deficits are appreciated. Skin:  Skin is warm, dry and intact. No rash noted. Psychiatric: Mood and affect are normal. Speech and behavior are normal.  ____________________________________________   LABS (all labs ordered are listed, but only abnormal results are displayed)  Labs Reviewed  HCG, QUANTITATIVE, PREGNANCY - Abnormal; Notable for the following components:      Result Value   hCG, Beta Chain, Quant, S 10,426 (*)    All other components within normal limits  CBC WITH DIFFERENTIAL/PLATELET - Abnormal; Notable for the following components:   WBC 16.8 (*)    Neutro Abs 12.1 (*)    Monocytes Absolute 1.3 (*)    Abs Immature Granulocytes 0.11 (*)    All other components within normal limits  URINALYSIS, COMPLETE (UACMP) WITH MICROSCOPIC    PROCEDURES  Procedure(s) performed (including Critical Care):  Procedures   ____________________________________________   INITIAL IMPRESSION / ASSESSMENT AND PLAN / ED COURSE      25 year old female, G1, P0 at approximately 5 weeks of pregnancy, presents to the ED complaining of intermittent abdominal cramping and crampy discomfort in her lower back for the past 4 to 5 days.  She is well-appearing with no focal tenderness on abdominal exam at this time.  We will further assess with ultrasound to evaluate for ectopic pregnancy.  No bleeding to necessitate RhoGAM at this time.  Labs are remarkable for mild leukocytosis but otherwise reassuring.  UA is pending.  Patient turned  over to oncoming provider pending ultrasound results.      ____________________________________________   FINAL CLINICAL IMPRESSION(S) / ED DIAGNOSES  Final diagnoses:  Abdominal pain during pregnancy in first trimester     ED Discharge Orders     None        Note:  This document was prepared using Dragon voice recognition software and may include unintentional dictation errors.    Chesley Noon, MD 12/24/20 [phone]

## 2020-12-24 NOTE — Discharge Instructions (Addendum)
  Your hCG level was 10,000.  You need to follow-up with your OB/GYN.  Please call them tomorrow to make the earliest appointment for this week for repeat evaluation.  Return to the ER for fevers, right lower quadrant pain or any other concerns  1. Intrauterine gestational sac containing a yolk sac but no fetal pole or cardiac activity is demonstrated. Recommend trending of beta HCG, and follow-up ultrasound as indicated. 2. Moderate to large subchorionic hemorrhage. 3. 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 4. Small fibroid in the uterus is likely intramural. This is 1 cm greatest dimension. 5. Pelvic free fluid, appearing simple.

## 2020-12-25 LAB — URINALYSIS, COMPLETE (UACMP) WITH MICROSCOPIC
Bilirubin Urine: NEGATIVE
Glucose, UA: NEGATIVE mg/dL
Hgb urine dipstick: NEGATIVE
Ketones, ur: NEGATIVE mg/dL
Nitrite: NEGATIVE
Protein, ur: NEGATIVE mg/dL
Specific Gravity, Urine: 1.02 (ref 1.005–1.030)
pH: 5.5 (ref 5.0–8.0)

## 2020-12-25 LAB — ABO/RH: ABO/RH(D): B POS

## 2020-12-25 NOTE — ED Notes (Signed)
Assumed care from previous nurse. To bedside for assessment.

## 2020-12-25 NOTE — ED Provider Notes (Signed)
2:34 AM Assumed care for off going team.   Blood pressure (!) 158/107, pulse 95, temperature 98.4 F (36.9 C), temperature source Oral, resp. rate 16, last menstrual period 11/17/2020, SpO2 100 %.  See their HPI for full report but in brief pending UA/US   Ultrasound does show intrauterine gestational sac containing yolk sac but no fetal pole or cardiac activity.  Discussed with patient that she needs to follow-up with her OB/GYN for repeat hCG/ultrasound this week.  Patient expressed understanding felt comfortable with that plan.  I also noted a moderate to large subchorionic hemorrhage I explained that this could cause some bleeding during pregnancy.  However her Rh status is positive so she will not need any RhoGAM.  I did a repeat abdominal exam and her abdomen is soft and nontender.  She has no right lower quadrant pain I do not feel like she has appendicitis.  We discussed that if she developed right lower quadrant pain or fevers that she needed to return to the ER immediately.  Patient expressed understanding.  Patient states that she was just tested for STDs and declined testing today.  Her urine does show some rare bacteria and I will send for urine culture to make sure no asymptomatic bacteriuria in pregnancy that would require treatment.  At this time she feels comfortable with discharge home and will return if symptoms are worsening  I discussed the provisional nature of ED diagnosis, the treatment so far, the ongoing plan of care, follow up appointments and return precautions with the patient and any family or support people present. They expressed understanding and agreed with the plan, discharged home.          Concha Se, MD 12/25/20 [phone]

## 2020-12-26 ENCOUNTER — Encounter: Payer: Self-pay | Admitting: Obstetrics and Gynecology

## 2020-12-26 ENCOUNTER — Other Ambulatory Visit: Payer: Self-pay

## 2020-12-26 ENCOUNTER — Ambulatory Visit (INDEPENDENT_AMBULATORY_CARE_PROVIDER_SITE_OTHER): Payer: 59 | Admitting: Obstetrics and Gynecology

## 2020-12-26 VITALS — BP 118/74 | Ht 64.0 in | Wt 157.0 lb

## 2020-12-26 DIAGNOSIS — Z3A01 Less than 8 weeks gestation of pregnancy: Secondary | ICD-10-CM | POA: Diagnosis not present

## 2020-12-26 DIAGNOSIS — R1084 Generalized abdominal pain: Secondary | ICD-10-CM | POA: Diagnosis not present

## 2020-12-26 DIAGNOSIS — Z3490 Encounter for supervision of normal pregnancy, unspecified, unspecified trimester: Secondary | ICD-10-CM

## 2020-12-26 DIAGNOSIS — O26891 Other specified pregnancy related conditions, first trimester: Secondary | ICD-10-CM

## 2020-12-26 DIAGNOSIS — O3680X0 Pregnancy with inconclusive fetal viability, not applicable or unspecified: Secondary | ICD-10-CM

## 2020-12-26 LAB — URINE CULTURE

## 2020-12-26 NOTE — Progress Notes (Signed)
Patient ID: Kari Morales, female   DOB: Aug 31, 1995, 25 y.o.   MRN: [id number]  Reason for Consult: Follow-up (Er Follow up for cramping during pregnancy)   Referred by No ref. provider found  Subjective:     HPI:  Kari Morales is a 25 y.o. female she is following up today after a trip to the ER.  She reports that the first day of her last period was July 30.  She has been trying to conceive on Clomid.  She had a positive pregnancy test on August 28.  She reports that she began having crampy abdominal pain in her midline and on the right side on August 5 and was seen in the ER.  She had a pelvic ultrasound that was done.  She denies any issues with vaginal bleeding.  She has otherwise been feeling well today. She has a known dermoid cyst on the right side.  Gynecological History  Patient's last menstrual period was 11/17/2020 (exact date). M Past Medical History:  Diagnosis Date   Hypertension    Migraine    PCOS (polycystic ovarian syndrome)    Family History  Problem Relation Age of Onset   Hypertension Mother    Cancer Paternal Grandfather    Prostate cancer Paternal Grandfather    Breast cancer Half-Sister    Past Surgical History:  Procedure Laterality Date   ganglian cyst removal in left hand     ~2007 or 2008 per pt   GANGLION CYST EXCISION Left    x2    Short Social History:  Social History   Tobacco Use   Smoking status: Every Day    Types: E-cigarettes   Smokeless tobacco: Never  Substance Use Topics   Alcohol use: Not Currently    Comment: occassionally    Allergies  Allergen Reactions   Shrimp [Shellfish Allergy] Anaphylaxis    Current Outpatient Medications  Medication Sig Dispense Refill   clomiPHENE (CLOMID) 50 MG tablet Take 2 tablets (100 mg total) by mouth daily. 10 tablet 0   fluconazole (DIFLUCAN) 150 MG tablet Take 1 tablet (150 mg total) by mouth daily. Take as directed. (Patient not taking: Reported on 08/10/2020) 2 tablet 0    metFORMIN (GLUCOPHAGE) 500 MG tablet Take by mouth 2 (two) times daily with a meal. (Patient not taking: Reported on 08/10/2020)     metroNIDAZOLE (FLAGYL) 500 MG tablet Take 500 mg by mouth 3 (three) times daily. (Patient not taking: Reported on 08/10/2020)     nitrofurantoin (MACRODANTIN) 100 MG capsule Take 100 mg by mouth 4 (four) times daily. (Patient not taking: Reported on 08/10/2020)     Prenatal Multivit-Min-Fe-FA (PRE-NATAL PO) Take by mouth.     No current facility-administered medications for this visit.    Review of Systems  Constitutional: Negative for chills, fatigue, fever and unexpected weight change.  HENT: Negative for trouble swallowing.  Eyes: Negative for loss of vision.  Respiratory: Negative for cough, shortness of breath and wheezing.  Cardiovascular: Negative for chest pain, leg swelling, palpitations and syncope.  GI: Negative for abdominal pain, blood in stool, diarrhea, nausea and vomiting.  GU: Negative for difficulty urinating, dysuria, frequency and hematuria.  Musculoskeletal: Negative for back pain, leg pain and joint pain.  Skin: Negative for rash.  Neurological: Negative for dizziness, headaches, light-headedness, numbness and seizures.  Psychiatric: Negative for behavioral problem, confusion, depressed mood and sleep disturbance.       Objective:  Objective   Vitals:   12/26/20 0856  BP:  118/74  Weight: 157 lb (71.2 kg)  Height: 5\' 4"  (1.626 m)   Body mass index is 26.95 kg/m.  Physical Exam Vitals and nursing note reviewed. Exam conducted with a chaperone present.  Constitutional:      Appearance: Normal appearance.  HENT:     Head: Normocephalic and atraumatic.  Eyes:     Extraocular Movements: Extraocular movements intact.     Pupils: Pupils are equal, round, and reactive to light.  Cardiovascular:     Rate and Rhythm: Normal rate and regular rhythm.  Pulmonary:     Effort: Pulmonary effort is normal.     Breath sounds: Normal breath  sounds.  Abdominal:     General: Abdomen is flat.     Palpations: Abdomen is soft.  Musculoskeletal:     Cervical back: Normal range of motion.  Skin:    General: Skin is warm and dry.  Neurological:     General: No focal deficit present.     Mental Status: She is alert and oriented to person, place, and time.  Psychiatric:        Behavior: Behavior normal.        Thought Content: Thought content normal.        Judgment: Judgment normal.    Assessment/Plan:     25 year old with abdominal pain in early pregnancy.  No bleeding.  Early pregnancy seen on ultrasound.  Discussed follow-up plan for 2 weeks from now to determine pregnancy viability.  Encourage patient to take prenatal vitamin and limit strenuous activity.  Advised to avoid hot tubs during pregnancy.  Encourage patient to avoid tobacco alcohol and drugs while pregnant.  More than 20 minutes were spent face to face with the patient in the room, reviewing the medical record, labs and images, and coordinating care for the patient. The plan of management was discussed in detail and counseling was provided.    Follow-up in 2 weeks    30 MD [facility], [facility] 12/26/2020 9:55 AM

## 2020-12-26 NOTE — Patient Instructions (Signed)
First Trimester of Pregnancy °The first trimester of pregnancy starts on the first day of your last menstrual period until the end of week 12. This is months 1 through 3 of pregnancy. A week after a sperm fertilizes an egg, the egg will implant into the wall of the uterus and begin to develop into a baby. By the end of 12 weeks, all the baby's organs will be formed and the baby will be 2-3 inches in size. °Body changes during your first trimester °Your body goes through many changes during pregnancy. The changes vary and generally return to normal after your baby is born. °Physical changes °You may gain or lose weight. °Your breasts may begin to grow larger and become tender. The tissue that surrounds your nipples (areola) may become darker. °Dark spots or blotches (chloasma or mask of pregnancy) may develop on your face. °You may have changes in your hair. These can include thickening or thinning of your hair or changes in texture. °Health changes °You may feel nauseous, and you may vomit. °You may have heartburn. °You may develop headaches. °You may develop constipation. °Your gums may bleed and may be sensitive to brushing and flossing. °Other changes °You may tire easily. °You may urinate more often. °Your menstrual periods will stop. °You may have a loss of appetite. °You may develop cravings for certain kinds of food. °You may have changes in your emotions from day to day. °You may have more vivid and strange dreams. °Follow these instructions at home: °Medicines °Follow your health care provider's instructions regarding medicine use. Specific medicines may be either safe or unsafe to take during pregnancy. Do not take any medicines unless told to by your health care provider. °Take a prenatal vitamin that contains at least 600 micrograms (mcg) of folic acid. °Eating and drinking °Eat a healthy diet that includes fresh fruits and vegetables, whole grains, good sources of protein such as meat, eggs, or tofu,  and low-fat dairy products. °Avoid raw meat and unpasteurized juice, milk, and cheese. These carry germs that can harm you and your baby. °If you feel nauseous or you vomit: °Eat 4 or 5 small meals a day instead of 3 large meals. °Try eating a few soda crackers. °Drink liquids between meals instead of during meals. °You may need to take these actions to prevent or treat constipation: °Drink enough fluid to keep your urine pale yellow. °Eat foods that are high in fiber, such as beans, whole grains, and fresh fruits and vegetables. °Limit foods that are high in fat and processed sugars, such as fried or sweet foods. °Activity °Exercise only as directed by your health care provider. Most people can continue their usual exercise routine during pregnancy. Try to exercise for 30 minutes at least 5 days a week. °Stop exercising if you develop pain or cramping in the lower abdomen or lower back. °Avoid exercising if it is very hot or humid or if you are at high altitude. °Avoid heavy lifting. °If you choose to, you may have sex unless your health care provider tells you not to. °Relieving pain and discomfort °Wear a good support bra to relieve breast tenderness. °Rest with your legs elevated if you have leg cramps or low back pain. °If you develop bulging veins (varicose veins) in your legs: °Wear support hose as told by your health care provider. °Elevate your feet for 15 minutes, 3-4 times a day. °Limit salt in your diet. °Safety °Wear your seat belt at all times when driving   or riding in a car. °Talk with your health care provider if someone is verbally or physically abusive to you. °Talk with your health care provider if you are feeling sad or have thoughts of hurting yourself. °Lifestyle °Do not use hot tubs, steam rooms, or saunas. °Do not douche. Do not use tampons or scented sanitary pads. °Do not use herbal remedies, alcohol, illegal drugs, or medicines that are not approved by your health care provider. Chemicals  in these products can harm your baby. °Do not use any products that contain nicotine or tobacco, such as cigarettes, e-cigarettes, and chewing tobacco. If you need help quitting, ask your health care provider. °Avoid cat litter boxes and soil used by cats. These carry germs that can cause birth defects in the baby and possibly loss of the unborn baby (fetus) by miscarriage or stillbirth. °General instructions °During routine prenatal visits in the first trimester, your health care provider will do a physical exam, perform necessary tests, and ask you how things are going. Keep all follow-up visits. This is important. °Ask for help if you have counseling or nutritional needs during pregnancy. Your health care provider can offer advice or refer you to specialists for help with various needs. °Schedule a dentist appointment. At home, brush your teeth with a soft toothbrush. Floss gently. °Write down your questions. Take them to your prenatal visits. °Where to find more information °American Pregnancy Association: americanpregnancy.org °American College of Obstetricians and Gynecologists: acog.org/en/Womens%20Health/Pregnancy °Office on Women's Health: womenshealth.gov/pregnancy °Contact a health care provider if you have: °Dizziness. °A fever. °Mild pelvic cramps, pelvic pressure, or nagging pain in the abdominal area. °Nausea, vomiting, or diarrhea that lasts for 24 hours or longer. °A bad-smelling vaginal discharge. °Pain when you urinate. °Known exposure to a contagious illness, such as chickenpox, measles, Zika virus, HIV, or hepatitis. °Get help right away if you have: °Spotting or bleeding from your vagina. °Severe abdominal cramping or pain. °Shortness of breath or chest pain. °Any kind of trauma, such as from a fall or a car crash. °New or increased pain, swelling, or redness in an arm or leg. °Summary °The first trimester of pregnancy starts on the first day of your last menstrual period until the end of week  12 (months 1 through 3). °Eating 4 or 5 small meals a day rather than 3 large meals may help to relieve nausea and vomiting. °Do not use any products that contain nicotine or tobacco, such as cigarettes, e-cigarettes, and chewing tobacco. If you need help quitting, ask your health care provider. °Keep all follow-up visits. This is important. °This information is not intended to replace advice given to you by your health care provider. Make sure you discuss any questions you have with your health care provider. °Document Revised: 09/14/2019 Document Reviewed: 07/21/2019 °Elsevier Patient Education © 2022 [employer] ° °

## 2021-01-07 ENCOUNTER — Ambulatory Visit
Admission: RE | Admit: 2021-01-07 | Discharge: 2021-01-07 | Disposition: A | Discharge status: Home / Self Care | Payer: 59 | Source: Ambulatory Visit | Attending: Obstetrics and Gynecology | Admitting: Obstetrics and Gynecology

## 2021-01-07 ENCOUNTER — Other Ambulatory Visit: Payer: Self-pay

## 2021-01-07 DIAGNOSIS — Z3490 Encounter for supervision of normal pregnancy, unspecified, unspecified trimester: Secondary | ICD-10-CM | POA: Insufficient documentation

## 2021-01-10 ENCOUNTER — Other Ambulatory Visit: Payer: Self-pay

## 2021-01-10 ENCOUNTER — Encounter: Payer: 59 | Admitting: Obstetrics and Gynecology

## 2021-01-10 ENCOUNTER — Telehealth: Payer: Self-pay

## 2021-01-10 NOTE — Telephone Encounter (Signed)
Called pt to let her know she should avoid intercourse until they talk more about the hemorrhage and her NOB visit next week . Pt understood.

## 2021-01-10 NOTE — Progress Notes (Signed)
Error

## 2021-01-16 ENCOUNTER — Encounter: Payer: Self-pay | Admitting: Advanced Practice Midwife

## 2021-01-16 ENCOUNTER — Ambulatory Visit (INDEPENDENT_AMBULATORY_CARE_PROVIDER_SITE_OTHER): Payer: 59 | Admitting: Advanced Practice Midwife

## 2021-01-16 ENCOUNTER — Other Ambulatory Visit: Payer: Self-pay

## 2021-01-16 VITALS — BP 120/80 | Wt 163.0 lb

## 2021-01-16 DIAGNOSIS — Z3A08 8 weeks gestation of pregnancy: Secondary | ICD-10-CM

## 2021-01-16 DIAGNOSIS — Z3401 Encounter for supervision of normal first pregnancy, first trimester: Secondary | ICD-10-CM

## 2021-01-16 DIAGNOSIS — Z113 Encounter for screening for infections with a predominantly sexual mode of transmission: Secondary | ICD-10-CM

## 2021-01-16 DIAGNOSIS — Z130 Encounter for screening for diseases of the blood and blood-forming organs and certain disorders involving the immune mechanism: Secondary | ICD-10-CM

## 2021-01-16 DIAGNOSIS — Z3481 Encounter for supervision of other normal pregnancy, first trimester: Secondary | ICD-10-CM

## 2021-01-16 DIAGNOSIS — Z1159 Encounter for screening for other viral diseases: Secondary | ICD-10-CM

## 2021-01-16 DIAGNOSIS — Z369 Encounter for antenatal screening, unspecified: Secondary | ICD-10-CM

## 2021-01-16 NOTE — Patient Instructions (Signed)

## 2021-01-17 ENCOUNTER — Encounter: Payer: Self-pay | Admitting: Advanced Practice Midwife

## 2021-01-17 DIAGNOSIS — Z3490 Encounter for supervision of normal pregnancy, unspecified, unspecified trimester: Secondary | ICD-10-CM | POA: Insufficient documentation

## 2021-01-17 DIAGNOSIS — O0990 Supervision of high risk pregnancy, unspecified, unspecified trimester: Secondary | ICD-10-CM | POA: Insufficient documentation

## 2021-01-17 NOTE — Progress Notes (Addendum)
New Obstetric Patient H&P  Date of Service: 01/16/2021  Chief Complaint: "Desires prenatal care"   History of Present Illness: Patient is a 25 y.o. G1P0 Not Hispanic or Latino female, presents with amenorrhea and positive home pregnancy test. Patient's last menstrual period was 11/17/2020 (exact date). and based on her  LMP, her EDD is Estimated Date of Delivery: 08/24/21 and her EGA is [redacted]w[redacted]d. Cycles are 5 days, regular, and occur approximately every : 28 days. Her last pap smear was 1 month ago and was no abnormalities.    She had a urine pregnancy test which was positive 4 week(s)  ago. Her last menstrual period was normal and lasted for  5 day(s). Since her LMP she claims she has experienced breast tenderness, fatigue, nausea, heartburn. She denies vaginal bleeding. Her past medical history is contributory for PCOS.   Since her LMP, she admits to the use of tobacco products  she quit with +UPT She claims she has gained  8  pounds since the start of her pregnancy.  There are cats in the home in the home  no  She admits close contact with children on a regular basis  no  She has had chicken pox in the past no She has had Tuberculosis exposures, symptoms, or previously tested positive for TB   no Current or past history of domestic violence. no  Genetic Screening/Teratology Counseling: (Includes patient, baby's father, or anyone in either family with:)   1. Patient's age >/= 65 at [facility]  no 2. Thalassemia ([place], [place], Mediterranean, or Asian background): MCV<80  no 3. Neural tube defect (meningomyelocele, spina bifida, anencephaly)  no 4. Congenital heart defect  no  5. Down syndrome  no 6. Tay-Sachs (Jewish, [place]  no 7. Canavan's Disease  no 8. Sickle cell disease or trait (African)  no  9. Hemophilia or other blood disorders  no  10. Muscular dystrophy  no  11. Cystic fibrosis  no  12. Huntington's Chorea  no  13. Mental retardation/autism  no 14. Other inherited  genetic or chromosomal disorder  no 15. Maternal metabolic disorder (DM, PKU, etc)  no 16. Patient or FOB with a child with a birth defect not listed above no  16a. Patient or FOB with a birth defect themselves no 17. Recurrent pregnancy loss, or stillbirth  no  18. Any medications since LMP other than prenatal vitamins (include vitamins, supplements, OTC meds, drugs, alcohol)  no 19. Any other genetic/environmental exposure to discuss  no  Infection History:   1. Lives with someone with TB or TB exposed  no  2. Patient or partner has history of genital herpes  no 3. Rash or viral illness since LMP  no 4. History of STI (GC, CT, HPV, syphilis, HIV)  hx CT/GC/TR 5. History of recent travel :  no  Other pertinent information:  no     Review of Systems:10 point review of systems negative unless otherwise noted in HPI  Past Medical History:  Patient Active Problem List   Diagnosis Date Noted   Supervision of normal pregnancy 01/17/2021     Nursing Staff Provider  Office Location  [facility] Dating    Language  English Anatomy US    Flu Vaccine   Genetic Screen  NIPS:   TDaP vaccine    Hgb A1C or  GTT Early : NA Third trimester :   Covid    LAB RESULTS   Rhogam   Blood Type --/--/B POS Performed at [employer]  [facility], [address] [place], [place] [address]  [phone])   Feeding Plan Breast Antibody    Contraception  Rubella    Circumcision  RPR     Pediatrician   HBsAg     Support Person Raheen HIV    Prenatal Classes  Varicella     GBS  (For PCN allergy, check sensitivities)   BTL Consent     VBAC Consent  Pap      Hgb Electro    Pelvis Tested  CF      SMA            UTI symptoms 09/13/2020    09/13/2020    Migraine headache 12/25/2017    Past Surgical History:  Past Surgical History:  Procedure Laterality Date   ganglian cyst removal in left hand     ~2007 or 2008 per pt   GANGLION CYST EXCISION Left    x2    Gynecologic History: Patient's last  menstrual period was 11/17/2020 (exact date).  Obstetric History: G1P0  Family History:  Family History  Problem Relation Age of Onset   Hypertension Mother    Cancer Paternal Grandfather    Prostate cancer Paternal Grandfather    Breast cancer Half-Sister     Social History:  Social History   Socioeconomic History   Marital status: Single    Spouse name: Not on file   Number of children: Not on file   Years of education: Not on file   Highest education level: Not on file  Occupational History   Not on file  Tobacco Use   Smoking status: Every Day    Types: E-cigarettes   Smokeless tobacco: Never  Vaping Use   Vaping Use: Every day   Start date: 09/03/2019  Substance and Sexual Activity   Alcohol use: Not Currently    Comment: occassionally   Drug use: Never   Sexual activity: Yes    Birth control/protection: None  Other Topics Concern   Not on file  Social History Narrative   Not on file   Social Determinants of Health   Financial Resource Strain: Not on file  Food Insecurity: Not on file  Transportation Needs: Not on file  Physical Activity: Not on file  Stress: Not on file  Social Connections: Not on file  Intimate Partner Violence: Not on file    Allergies:  Allergies  Allergen Reactions   Shrimp [Shellfish 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 Authorizing Provider  Prenatal Multivit-Min-Fe-FA (PRE-NATAL PO) Take by mouth.   Yes [provider]  clomiPHENE (CLOMID) 50 MG tablet Take 2 tablets (100 mg total) by mouth daily. Patient not taking: Reported on 01/16/2021 11/19/20   Conard Novak, MD  fluconazole (DIFLUCAN) 150 MG tablet Take 1 tablet (150 mg total) by mouth daily. Take as directed. Patient not taking: No sig reported 07/03/20   Joni Reining, PA-C  metFORMIN (GLUCOPHAGE) 500 MG tablet Take by mouth 2 (two) times daily with a meal. Patient not taking:  No sig reported    [provider]  metroNIDAZOLE (FLAGYL) 500 MG tablet Take 500 mg by mouth 3 (three) times daily. Patient not taking: No sig reported    [provider]  nitrofurantoin (MACRODANTIN) 100 MG capsule Take 100 mg by mouth 4 (four) times daily. Patient not taking: No sig reported    [provider]    Physical Exam  Vitals: Blood pressure 120/80, weight 163 lb (73.9 kg), last menstrual period 11/17/2020.  General: NAD HEENT: normocephalic, anicteric Thyroid: no enlargement, no palpable nodules Pulmonary: No increased work of breathing, CTAB Cardiovascular: RRR, distal pulses 2+ Abdomen: NABS, soft, non-tender, non-distended.  Umbilicus without lesions.  No hepatomegaly, splenomegaly or masses palpable. No evidence of hernia  Genitourinary: deferred for recent PAPtima Extremities: no edema, erythema, or tenderness Neurologic: Grossly intact Psychiatric: mood appropriate, affect full   The following were addressed during this visit:  Breastfeeding Education - Early initiation of breastfeeding    Comments: Keeps milk supply adequate, helps contract uterus and slow bleeding, and early milk is the perfect first food and is easy to digest.   - The importance of exclusive breastfeeding    Comments: Provides antibodies, Lower risk of breast and ovarian cancers, and type-2 diabetes,Helps your body recover, Reduced chance of SIDS.   - Risks of giving your baby anything other than breast milk if you are breastfeeding    Comments: Make the baby less content with breastfeeds, may make my baby more susceptible to illness, and may reduce my milk supply.   - The importance of early skin-to-skin contact    Comments:  Keeps baby warm and secure, helps keep baby's blood sugar up and breathing steady, easier to bond and breastfeed, and helps calm baby.  - Rooming-in on a 24-hour basis    Comments: Easier to learn baby's feeding cues, easier to bond and  get to know each other, and encourages milk production.   - Feeding on demand or baby-led feeding    Comments: Helps prevent breastfeeding complications, helps bring in good milk supply, prevents under or overfeeding, and helps baby feel content and satisfied   - Frequent feeding to help assure optimal milk production    Comments: Making a full supply of milk requires frequent removal of milk from breasts, infant will eat 8-12 times in 24 hours, if separated from infant use breast massage, hand expression and/ or pumping to remove milk from breasts.   - Effective positioning and attachment    Comments: Helps my baby to get enough breast milk, helps to produce an adequate milk supply, and helps prevent nipple pain and damage   - Exclusive breastfeeding for the first 6 months    Comments: Builds a healthy milk supply and keeps it up, protects baby from sickness and disease, and breastmilk has everything your baby needs for the first 6 months.   Assessment: 25 y.o. G1P0 at [redacted]w[redacted]d presenting to initiate prenatal care  Plan: 1) Avoid alcoholic beverages. 2) Patient encouraged not to smoke.  3) Discontinue the use of all non-medicinal drugs and chemicals.  4) Take prenatal vitamins daily.  5) Nutrition, food safety (fish, cheese advisories, and high nitrite foods) and exercise discussed. 6) Hospital and practice style discussed with cross coverage system.  7) Genetic Screening, such as with 1st Trimester Screening, cell free fetal DNA, AFP testing, and Ultrasound, as well as with amniocentesis and CVS as appropriate, is discussed with patient. At the conclusion of today's visit patient requested genetic testing 8) Patient is asked about travel to areas at risk for the Zika virus, and counseled to avoid travel and exposure to mosquitoes or sexual partners who may have themselves been exposed to the virus. Testing is discussed, and will be ordered as appropriate.  9) urine culture today 10)  Return to clinic in 1 week for dating scan, routine NOB labs (future ordered) 11) MaterniT 21 at  10+ weeks   Tresea Mall, CNM [facility] 01/17/2021, 9:17 AM

## 2021-01-19 LAB — URINE CULTURE

## 2021-01-28 ENCOUNTER — Ambulatory Visit (INDEPENDENT_AMBULATORY_CARE_PROVIDER_SITE_OTHER): Payer: 59 | Admitting: Obstetrics & Gynecology

## 2021-01-28 ENCOUNTER — Other Ambulatory Visit: Payer: Self-pay

## 2021-01-28 VITALS — BP 120/80 | Wt 168.0 lb

## 2021-01-28 DIAGNOSIS — Z3A10 10 weeks gestation of pregnancy: Secondary | ICD-10-CM

## 2021-01-28 DIAGNOSIS — Z3689 Encounter for other specified antenatal screening: Secondary | ICD-10-CM

## 2021-01-28 DIAGNOSIS — O468X1 Other antepartum hemorrhage, first trimester: Secondary | ICD-10-CM

## 2021-01-28 DIAGNOSIS — Z369 Encounter for antenatal screening, unspecified: Secondary | ICD-10-CM

## 2021-01-28 DIAGNOSIS — Z3401 Encounter for supervision of normal first pregnancy, first trimester: Secondary | ICD-10-CM

## 2021-01-28 DIAGNOSIS — O418X10 Other specified disorders of amniotic fluid and membranes, first trimester, not applicable or unspecified: Secondary | ICD-10-CM

## 2021-01-28 DIAGNOSIS — Z1379 Encounter for other screening for genetic and chromosomal anomalies: Secondary | ICD-10-CM

## 2021-01-28 LAB — POCT URINALYSIS DIPSTICK OB
Glucose, UA: NEGATIVE
POC,PROTEIN,UA: NEGATIVE

## 2021-01-28 MED ORDER — PRENATE DHA 18-0.6-0.4-300 MG PO CAPS: 1.0000 | ORAL_CAPSULE | Freq: Every day | ORAL | 11 refills | Status: DC

## 2021-01-28 MED ORDER — CONCEPT OB 130-92.4-1 MG PO CAPS: 1.0000 | ORAL_CAPSULE | Freq: Every day | ORAL | 11 refills | Status: DC

## 2021-01-28 NOTE — Addendum Note (Signed)
Addended by: Cornelius Moras D on: 01/28/2021 04:24 PM   Modules accepted: Orders

## 2021-01-28 NOTE — Patient Instructions (Signed)
Genetic Testing During Pregnancy Why is genetic testing done? Genetic testing during pregnancy is also called prenatal genetic testing. This type of testing can determine if your baby is at risk of being born with a disorder caused by abnormal genes or chromosomes (genetic disorder). Chromosomes contain genes that control how your baby will develop in your womb. There are many different genetic disorders. Examples of genetic disorders that may be found through genetic testing include Down syndrome and cystic fibrosis. Gene changes (mutations) can be passed down through families. Genetic testing is offered to women during pregnancy. You can choose whether to have genetic testing. Having genetic testing allows you to: Discuss your test results and options with your health care provider. Prepare for a baby that may be born with a genetic disorder. Learning about the disorder ahead of time helps you be better prepared to manage it. Your health care providers can also be prepared in case your baby requires special care before or after birth. Consider whether you want to continue with the pregnancy. In some cases, genetic testing may be done to learn about the traits a child will inherit. Types of genetic tests There are two basic types of genetic testing. Screening tests indicate whether your developing baby (fetus) is at higher risk for a genetic disorder. Diagnostic tests check actual fetal cells to diagnose a genetic disorder. Screening tests   Screening tests will not harm your baby. They are recommended for all pregnant women. Types of screening tests include: Carrier screening. This test involves checking genes from both parents by testing their blood or saliva. The test checks to find out if the parents carry a genetic mutation that may be passed to a baby. In most cases, both parents must carry the mutation for a baby to be at risk. First trimester screening. This test combines a blood test with  sound wave imaging of your baby (fetal ultrasound). This screening test checks for a risk of Down syndrome or other defects caused by having extra chromosomes. The ultrasound also checks for defects of the heart, abdomen, or skeleton. Second trimester screening also combines a blood test with a fetal ultrasound exam. This test checks for a risk of Down syndrome or other defects caused by having extra chromosomes. The ultrasound allows your health care provider to look for genetic defects of the face, brain, spine, heart, or limbs. Some women may choose to only have an ultrasound exam without a blood test. Combined or sequential screening. This type of testing combines the results of first and second trimester screening. This type of testing may be more accurate than first or second trimester screening alone. Cell-free DNA testing. This is a blood test that detects cells released by the placenta that get into the mother's blood. It can be used to check for a risk of Down syndrome, other extra chromosome syndromes, and disorders caused by abnormal numbers of sex chromosomes. This test can be done any time after 10 weeks of pregnancy.  Diagnostic tests Diagnostic tests carry slight risks of problems, including bleeding, infection, and loss of the pregnancy. These tests are done only if your baby is at risk for a genetic disorder. Your health care provider will discuss the risks and benefits of having diagnostic tests before performing these types of tests. Examples of diagnostic tests include: Chorionic villus sampling (CVS). This involves a procedure to remove and test a sample of cells taken from the placenta. The procedure may be done between 10 and 12 weeks  of pregnancy. Amniocentesis. This involves a procedure to remove and test a sample of fluid (amniotic fluid) and cells from the sac that surrounds the developing baby. The procedure may be done any time during the pregnancy, but it is usually done  between 15 and 20 weeks of pregnancy. What do the results mean? For a screening test: If the results are negative, it often means that your child is not at higher risk. There is still a slight chance your child could have a genetic disorder. If the results are positive, it does not mean your child will have a genetic disorder. It may mean that your child has a higher-than-normal risk for a genetic disorder. In that case, you should talk with your health care provider about whether you should have diagnostic genetic tests. For a diagnostic test: If the result is negative, it is unlikely that your child will have a genetic disorder. If the test is positive for a genetic disorder, it is likely that your child will have the disorder. The test may not tell how severe the disorder will be. Talk with your health care provider about your options. Talk with your health care provider about what your results mean. Questions to ask your health care provider Before talking to your health care provider about genetic testing, find out if there is a history of genetic disorders in your family. It may also help to know your family's ethnic origins. Then ask your health care provider the following questions: Is my baby at risk for a genetic disorder? What are the benefits of having genetic screening? What tests are best for me and my baby? What are the risks of each test? If I get a positive result on a screening test, what is the next step? Should I meet with a genetic counselor? Should my partner or other members of my family be tested? How much do the tests cost? Will my insurance cover the testing? Summary Genetic testing is done during pregnancy to find out whether your child is at risk for a genetic disorder. Genetic testing is offered to women during pregnancy. You can choose whether to have genetic testing. There are two basic types of genetic testing. Screening tests indicate whether your developing  baby (fetus) is at higher risk for a genetic disorder. Diagnostic tests check actual fetal cells to diagnose a genetic disorder. If a diagnostic genetic test is positive, talk with your health care provider about your options. This information is not intended to replace advice given to you by your health care provider. Make sure you discuss any questions you have with your health care provider. Document Revised: 10/28/2019 Document Reviewed: 10/28/2019 Elsevier Patient Education  2022 [employer].

## 2021-01-28 NOTE — Progress Notes (Signed)
  Subjective  Vaginal Bleeding? No Nausea? mild  Objective  BP 120/80   Wt 168 lb (76.2 kg)   LMP 11/17/2020 (Exact Date) Comment: G1P0  BMI 28.84 kg/m  General: NAD Pumonary: no increased work of breathing Abdomen: gravid, non-tender Extremities: no edema Psychiatric: mood appropriate, affect full  Assessment  25 y.o. G1P0 at [redacted]w[redacted]d by  08/24/2021, by Last Menstrual Period presenting for routine prenatal visit  Plan   Problem List Items Addressed This Visit      Other   Supervision of normal pregnancy - Primary  Other Visit Diagnoses    [redacted] weeks gestation of pregnancy       Encounter for genetic screening for Down Syndrome       Relevant Orders   MaterniT21 PLUS Core+SCA   Screening, antenatal, for fetal anatomic survey       Relevant Orders   US OB Comp + 14 Wk   Subchorionic hemorrhage of placenta in first trimester, single or unspecified fetus       Relevant Orders   US OB Transvaginal    PNV Rx [place] to assess [facility].  No sx's. Labs today.  Include NIPT.  pregnancy1  Problems (from 01/16/21 to present)    Problem Noted Resolved   Supervision of normal pregnancy 01/17/2021 by Tresea Mall, CNM No   Overview Signed 01/17/2021  8:32 AM by Tresea Mall, CNM     Nursing Staff Provider  Office Location  [facility] Dating  LMP and [place]  Language  English Anatomy US    Flu Vaccine   Genetic Screen  NIPS:   TDaP vaccine    Hgb A1C or  GTT Early : NA Third trimester :   Covid    LAB RESULTS   Rhogam   Blood Type --/--/B POS Performed at [facility], [address] [place], [place] [address]  [phone])   Feeding Plan Breast Antibody    Contraception  Rubella    Circumcision  RPR     Pediatrician   HBsAg     Support Person Raheen HIV    Prenatal Classes  Varicella     GBS  (For PCN allergy, check sensitivities)   BTL Consent     VBAC Consent  Pap      Hgb Electro    Pelvis Tested  CF      SMA                   Kari Major, MD, Merlinda Frederick  Ob/Gyn, [facility] 01/28/2021  4:22 PM

## 2021-01-31 ENCOUNTER — Telehealth: Payer: Self-pay

## 2021-01-31 NOTE — Telephone Encounter (Signed)
Pt calling; has a brown d/c; is 10wks; is concerned.  [phone]  Adv pt brown means it's old blood; not to be concerned unless it's like a period; no recent IC.

## 2021-02-01 ENCOUNTER — Other Ambulatory Visit: Payer: Self-pay | Admitting: Obstetrics & Gynecology

## 2021-02-01 ENCOUNTER — Telehealth: Payer: Self-pay

## 2021-02-01 DIAGNOSIS — O418X10 Other specified disorders of amniotic fluid and membranes, first trimester, not applicable or unspecified: Secondary | ICD-10-CM

## 2021-02-01 NOTE — Progress Notes (Signed)
[id number]

## 2021-02-01 NOTE — Telephone Encounter (Signed)
Called to confirm scheduled anatomy scan for 04/01/21. Patient is confused to why she it's having another ultrasound done. I advised patient I am not clinical and would have a nurse call her to speak with her concerns. Please advise

## 2021-02-01 NOTE — Telephone Encounter (Signed)
Plz sch [place] due to [facility] for next week plz (stat if we have to)

## 2021-02-02 LAB — MATERNIT21 PLUS CORE+SCA
Fetal Fraction: 6
Monosomy X (Turner Syndrome): NOT DETECTED
Result (T21): NEGATIVE
Trisomy 13 (Patau syndrome): NEGATIVE
Trisomy 18 (Edwards syndrome): NEGATIVE
Trisomy 21 (Down syndrome): NEGATIVE
XXX (Triple X Syndrome): NOT DETECTED
XXY (Klinefelter Syndrome): NOT DETECTED
XYY (Jacobs Syndrome): NOT DETECTED

## 2021-02-07 ENCOUNTER — Other Ambulatory Visit: Payer: Self-pay | Admitting: Obstetrics & Gynecology

## 2021-02-07 ENCOUNTER — Other Ambulatory Visit: Payer: Self-pay

## 2021-02-07 ENCOUNTER — Ambulatory Visit
Admission: RE | Admit: 2021-02-07 | Discharge: 2021-02-07 | Disposition: A | Discharge status: Home / Self Care | Payer: 59 | Source: Ambulatory Visit | Attending: Obstetrics & Gynecology | Admitting: Obstetrics & Gynecology

## 2021-02-07 DIAGNOSIS — O418X10 Other specified disorders of amniotic fluid and membranes, first trimester, not applicable or unspecified: Secondary | ICD-10-CM

## 2021-02-07 DIAGNOSIS — O468X1 Other antepartum hemorrhage, first trimester: Secondary | ICD-10-CM | POA: Diagnosis present

## 2021-02-18 ENCOUNTER — Telehealth: Payer: Self-pay

## 2021-02-18 ENCOUNTER — Other Ambulatory Visit: Payer: Self-pay

## 2021-02-18 ENCOUNTER — Ambulatory Visit
Admission: EM | Admit: 2021-02-18 | Discharge: 2021-02-18 | Disposition: A | Discharge status: Home / Self Care | Payer: 59 | Attending: Internal Medicine | Admitting: Internal Medicine

## 2021-02-18 DIAGNOSIS — M545 Low back pain, unspecified: Secondary | ICD-10-CM | POA: Diagnosis not present

## 2021-02-18 NOTE — Discharge Instructions (Signed)
Gentle range of motion exercises Take Tylenol as needed for back pain Heating pad use only 20 minutes on-20 minutes off cycle will help with back pain If you have lower extremity weakness, worsening back pain, vaginal bleeding or bloody urine please return to urgent care or go to the maternity assessment unit to be reevaluated.

## 2021-02-18 NOTE — Telephone Encounter (Signed)
Pt calling; is 13 wks; having severe back pain; can't walk or lay right.  [phone]  Adv pt baby is too little to cause this problem now; adv to see PCP, UC, or ED.

## 2021-02-18 NOTE — ED Triage Notes (Signed)
Pt here with C/O back pain for 3 days, today is the worst. No known recent injury. Was seen in [place] for UTI and given antibiotics.

## 2021-02-18 NOTE — ED Provider Notes (Addendum)
[facility]    CSN: [id number] Arrival date & time: 02/18/21  1249      History   Chief Complaint Chief Complaint  Patient presents with   Back Pain    HPI Kari Morales is a 25 y.o. female currently 3 months pregnant comes to the urgent care with low back pain.  Patient says pain started yesterday and has been persistent.  Pain is sharp, aggravated by movement with no known relieving factors.  No radiation of pain.  No abdominal pain.  No vaginal discharge or bleeding.  No dysuria urgency or frequency.  No fever or chills..  Patient has tried Tylenol and heating pad with no significant improvement.  No numbness or tingling.  No weakness in lower extremities.  HPI  Past Medical History:  Diagnosis Date   Hypertension    Migraine    PCOS (polycystic ovarian syndrome)     Patient Active Problem List   Diagnosis Date Noted   Supervision of normal pregnancy 01/17/2021   UTI symptoms 09/13/2020   Migraine headache 12/25/2017    Past Surgical History:  Procedure Laterality Date   ganglian cyst removal in left hand     ~2007 or 2008 per pt   GANGLION CYST EXCISION Left    x2    OB History     Gravida  1   Para      Term      Preterm      AB      Living         SAB      IAB      Ectopic      Multiple      Live Births               Home Medications    Prior to Admission medications   Medication Sig Start Date End Date Taking? Authorizing Provider  metroNIDAZOLE (FLAGYL) 500 MG tablet Take 500 mg by mouth 3 (three) times daily.   Yes [provider]  nitrofurantoin (MACRODANTIN) 100 MG capsule Take 100 mg by mouth 4 (four) times daily.   Yes [provider]  Prenat w/o A Vit-FeFum-FePo-FA (CONCEPT OB) 130-92.4-1 MG CAPS Take 1 capsule by mouth daily. 01/28/21  Yes Nadara Mustard, MD    Family History Family History  Problem Relation Age of Onset   Hypertension Mother    Cancer Paternal Grandfather     Prostate cancer Paternal Grandfather    Breast cancer Half-Sister     Social History Social History   Tobacco Use   Smoking status: Every Day    Types: E-cigarettes   Smokeless tobacco: Never  Vaping Use   Vaping Use: Every day   Start date: 09/03/2019  Substance Use Topics   Alcohol use: Not Currently    Comment: occassionally   Drug use: Never     Allergies   Shrimp [shellfish allergy]   Review of Systems Review of Systems  Musculoskeletal:  Positive for back pain. Negative for gait problem, joint swelling, myalgias, neck pain and neck stiffness.  Skin: Negative.     Physical Exam Triage Vital Signs ED Triage Vitals  Enc Vitals Group     BP 02/18/21 1441 119/77     Pulse Rate 02/18/21 1441 96     Resp 02/18/21 1441 18     Temp 02/18/21 1441 98.7 F (37.1 C)     Temp Source 02/18/21 1441 Oral     SpO2 02/18/21 1441 97 %  Weight 02/18/21 1440 170 lb (77.1 kg)     Height 02/18/21 1440 5\' 4"  (1.626 m)     Head Circumference --      Peak Flow --      Pain Score 02/18/21 1439 10     Pain Loc --      Pain Edu? --      Excl. in GC? --    No data found.  Updated Vital Signs BP 119/77 (BP Location: Right Arm)   Pulse 96   Temp 98.7 F (37.1 C) (Oral)   Resp 18   Ht 5\' 4"  (1.626 m)   Wt 77.1 kg   LMP 11/17/2020 (Exact Date) Comment: G1P0  SpO2 97%   BMI 29.18 kg/m   Visual Acuity Right Eye Distance:   Left Eye Distance:   Bilateral Distance:    Right Eye Near:   Left Eye Near:    Bilateral Near:     Physical Exam Vitals and nursing note reviewed.  Cardiovascular:     Rate and Rhythm: Normal rate and regular rhythm.  Pulmonary:     Effort: Pulmonary effort is normal.     Breath sounds: Normal breath sounds.  Abdominal:     General: Bowel sounds are normal.     Palpations: Abdomen is soft.  Musculoskeletal:        General: Tenderness present. No deformity. Normal range of motion.  Skin:    General: Skin is warm.     UC Treatments /  Results  Labs (all labs ordered are listed, but only abnormal results are displayed) Labs Reviewed - No data to display  EKG   Radiology No results found.  Procedures Procedures (including critical care time)  Medications Ordered in UC Medications - No data to display  Initial Impression / Assessment and Plan / UC Course  I have reviewed the triage vital signs and the nursing notes.  Pertinent labs & imaging results that were available during my care of the patient were reviewed by me and considered in my medical decision making (see chart for details).     1.  Acute low back pain without sciatica: Gentle range of motion exercises Heating pad use Tylenol as needed for pain Return to urgent care if symptoms worsen. If you have abdominal pain or vaginal bleeding please go to the emergency department for further evaluation. Final Clinical Impressions(s) / UC Diagnoses   Final diagnoses:  Acute midline low back pain without sciatica     Discharge Instructions      Gentle range of motion exercises Take Tylenol as needed for back pain Heating pad use only 20 minutes on-20 minutes off cycle will help with back pain If you have lower extremity weakness, worsening back pain, vaginal bleeding or bloody urine please return to urgent care or go to the maternity assessment unit to be reevaluated.   ED Prescriptions   None    PDMP not reviewed this encounter.   , MD 02/18/21 1539    Merrilee Jansky, MD 02/18/21 [phone]

## 2021-02-25 ENCOUNTER — Ambulatory Visit (INDEPENDENT_AMBULATORY_CARE_PROVIDER_SITE_OTHER): Payer: 59 | Admitting: Advanced Practice Midwife

## 2021-02-25 ENCOUNTER — Encounter: Payer: Self-pay | Admitting: Advanced Practice Midwife

## 2021-02-25 ENCOUNTER — Other Ambulatory Visit: Payer: Self-pay

## 2021-02-25 VITALS — BP 120/80 | Wt 172.0 lb

## 2021-02-25 DIAGNOSIS — Z3402 Encounter for supervision of normal first pregnancy, second trimester: Secondary | ICD-10-CM

## 2021-02-25 DIAGNOSIS — Z3A14 14 weeks gestation of pregnancy: Secondary | ICD-10-CM

## 2021-02-25 LAB — POCT URINALYSIS DIPSTICK OB
Glucose, UA: NEGATIVE
POC,PROTEIN,UA: NEGATIVE

## 2021-02-25 NOTE — Addendum Note (Signed)
Addended by: Cornelius Moras D on: 02/25/2021 04:11 PM   Modules accepted: Orders

## 2021-02-25 NOTE — Progress Notes (Signed)
  Routine Prenatal Care Visit  Subjective  Kari Morales is a 25 y.o. G1P0 at [redacted]w[redacted]d being seen today for ongoing prenatal care.  She is currently monitored for the following issues for this low-risk pregnancy and has Migraine headache; UTI symptoms; and Supervision of normal pregnancy on their problem list.  ----------------------------------------------------------------------------------- Patient reports backache.  She has a history of back injury and reports left side sciatica most recently. Comfort measures reviewed. She declines referral to PT today.   . Vag. Bleeding: None.   . Leaking Fluid denies.  ----------------------------------------------------------------------------------- The following portions of the patient's history were reviewed and updated as appropriate: allergies, current medications, past family history, past medical history, past social history, past surgical history and problem list. Problem list updated.  Objective  Blood pressure 120/80, weight 172 lb (78 kg), last menstrual period 11/17/2020. Pregravid weight 155 lb (70.3 kg) Total Weight Gain 17 lb (7.711 kg) Urinalysis: Urine Protein    Urine Glucose    Fetal Status: Fetal Heart Rate (bpm): 159         General:  Alert, oriented and cooperative. Patient is in no acute distress.  Skin: Skin is warm and dry. No rash noted.   Cardiovascular: Normal heart rate noted  Respiratory: Normal respiratory effort, no problems with respiration noted  Abdomen: Soft, gravid, appropriate for gestational age. Pain/Pressure: Present     Pelvic:  Cervical exam deferred        Extremities: Normal range of motion.  Edema: None  Mental Status: Normal mood and affect. Normal behavior. Normal judgment and thought content.   Assessment   25 y.o. G1P0 at [redacted]w[redacted]d by  08/24/2021, by Last Menstrual Period presenting for routine prenatal visit  Plan   pregnancy1  Problems (from 01/16/21 to present)    Problem Noted Resolved    Supervision of normal pregnancy 01/17/2021 by Tresea Mall, CNM No   Overview Addendum 01/28/2021  4:21 PM by Nadara Mustard, MD     Nursing Staff Provider  Office Location  [facility] Dating  LMP, confirmed by [place]  Language  English Anatomy US    Flu Vaccine   Genetic Screen  NIPS:   TDaP vaccine    Hgb A1C or  GTT Early : NA Third trimester :   Covid    LAB RESULTS   Rhogam   Blood Type --/--/B POS Performed at [facility], [address] [place], [place] [address]  [phone])   Feeding Plan Breast Antibody    Contraception  Rubella    Circumcision  RPR     Pediatrician   HBsAg     Support Person Raheen HIV    Prenatal Classes  Varicella     GBS  (For PCN allergy, check sensitivities)   BTL Consent     VBAC Consent  Pap      Hgb Electro    Pelvis Tested  CF      SMA                   Preterm labor symptoms and general obstetric precautions including but not limited to vaginal bleeding, contractions, leaking of fluid and fetal movement were reviewed in detail with the patient. Please refer to After Visit Summary for other counseling recommendations.   Return for scheduled appointments.  Tresea Mall, CNM 02/25/2021 4:01 PM

## 2021-02-26 ENCOUNTER — Other Ambulatory Visit: Payer: Self-pay | Admitting: Obstetrics & Gynecology

## 2021-02-26 DIAGNOSIS — IMO0001 Reserved for inherently not codable concepts without codable children: Secondary | ICD-10-CM

## 2021-02-26 DIAGNOSIS — O418X10 Other specified disorders of amniotic fluid and membranes, first trimester, not applicable or unspecified: Secondary | ICD-10-CM

## 2021-02-26 DIAGNOSIS — O468X1 Other antepartum hemorrhage, first trimester: Secondary | ICD-10-CM

## 2021-03-05 ENCOUNTER — Telehealth: Payer: Self-pay

## 2021-03-05 ENCOUNTER — Emergency Department: Payer: 59

## 2021-03-05 ENCOUNTER — Emergency Department
Admission: EM | Admit: 2021-03-05 | Discharge: 2021-03-05 | Disposition: A | Discharge status: Home / Self Care | Payer: 59 | Attending: Emergency Medicine | Admitting: Emergency Medicine

## 2021-03-05 ENCOUNTER — Other Ambulatory Visit: Payer: Self-pay

## 2021-03-05 DIAGNOSIS — R109 Unspecified abdominal pain: Secondary | ICD-10-CM

## 2021-03-05 DIAGNOSIS — Z5321 Procedure and treatment not carried out due to patient leaving prior to being seen by health care provider: Secondary | ICD-10-CM | POA: Insufficient documentation

## 2021-03-05 DIAGNOSIS — N898 Other specified noninflammatory disorders of vagina: Secondary | ICD-10-CM | POA: Insufficient documentation

## 2021-03-05 DIAGNOSIS — Z3A16 16 weeks gestation of pregnancy: Secondary | ICD-10-CM | POA: Diagnosis not present

## 2021-03-05 DIAGNOSIS — O26892 Other specified pregnancy related conditions, second trimester: Secondary | ICD-10-CM | POA: Diagnosis not present

## 2021-03-05 LAB — CBC WITH DIFFERENTIAL/PLATELET
Abs Immature Granulocytes: 0.13 10*3/uL — ABNORMAL HIGH (ref 0.00–0.07)
Basophils Absolute: 0 10*3/uL (ref 0.0–0.1)
Basophils Relative: 0 %
Eosinophils Absolute: 0.2 10*3/uL (ref 0.0–0.5)
Eosinophils Relative: 1 %
HCT: 37.7 % (ref 36.0–46.0)
Hemoglobin: 13.3 g/dL (ref 12.0–15.0)
Immature Granulocytes: 1 %
Lymphocytes Relative: 11 %
Lymphs Abs: 1.6 10*3/uL (ref 0.7–4.0)
MCH: 28.8 pg (ref 26.0–34.0)
MCHC: 35.3 g/dL (ref 30.0–36.0)
MCV: 81.6 fL (ref 80.0–100.0)
Monocytes Absolute: 1.6 10*3/uL — ABNORMAL HIGH (ref 0.1–1.0)
Monocytes Relative: 11 %
Neutro Abs: 11 10*3/uL — ABNORMAL HIGH (ref 1.7–7.7)
Neutrophils Relative %: 76 %
Platelets: 161 10*3/uL (ref 150–400)
RBC: 4.62 MIL/uL (ref 3.87–5.11)
RDW: 12.7 % (ref 11.5–15.5)
WBC: 14.4 10*3/uL — ABNORMAL HIGH (ref 4.0–10.5)
nRBC: 0 % (ref 0.0–0.2)

## 2021-03-05 LAB — URINALYSIS, COMPLETE (UACMP) WITH MICROSCOPIC
Bilirubin Urine: NEGATIVE
Glucose, UA: NEGATIVE mg/dL
Hgb urine dipstick: NEGATIVE
Ketones, ur: NEGATIVE mg/dL
Nitrite: NEGATIVE
Protein, ur: NEGATIVE mg/dL
Specific Gravity, Urine: 1.006 (ref 1.005–1.030)
pH: 5 (ref 5.0–8.0)

## 2021-03-05 LAB — COMPREHENSIVE METABOLIC PANEL
ALT: 117 U/L — ABNORMAL HIGH (ref 0–44)
AST: 57 U/L — ABNORMAL HIGH (ref 15–41)
Albumin: 3.4 g/dL — ABNORMAL LOW (ref 3.5–5.0)
Alkaline Phosphatase: 48 U/L (ref 38–126)
Anion gap: 8 (ref 5–15)
BUN: 6 mg/dL (ref 6–20)
CO2: 21 mmol/L — ABNORMAL LOW (ref 22–32)
Calcium: 8.9 mg/dL (ref 8.9–10.3)
Chloride: 105 mmol/L (ref 98–111)
Creatinine, Ser: 0.63 mg/dL (ref 0.44–1.00)
GFR, Estimated: >60 mL/min (ref >60)
Glucose, Bld: 99 mg/dL (ref 70–99)
Potassium: 3.4 mmol/L — ABNORMAL LOW (ref 3.5–5.1)
Sodium: 134 mmol/L — ABNORMAL LOW (ref 135–145)
Total Bilirubin: 0.7 mg/dL (ref 0.3–1.2)
Total Protein: 6.6 g/dL (ref 6.5–8.1)

## 2021-03-05 LAB — ABO/RH: ABO/RH(D): B POS

## 2021-03-05 LAB — HCG, QUANTITATIVE, PREGNANCY: hCG, Beta Chain, Quant, S: 21478 m[IU]/mL — ABNORMAL HIGH (ref <5)

## 2021-03-05 LAB — LIPASE, BLOOD: Lipase: 29 U/L (ref 11–51)

## 2021-03-05 NOTE — ED Provider Notes (Signed)
Emergency Medicine Provider Triage Evaluation Note  Aasha Dina , a 25 y.o. female  was evaluated in triage.  Pt complains of sharp lower abdominal pain and brown vaginal discharge.  Patient states that abdominal pain seems to radiate to her back.  Patient is approximately [redacted] weeks pregnant.  She reports that she has a history of subchorionic hemorrhage but states that at last ultrasound had seemingly resolved.  She states that she has had recent UTI but finished Macrobid as directed.  No chest pain, chest tightness or shortness of breath.  Review of Systems  Positive: Patient has vaginal bleeding and abdominal pain Negative: No chest pain or chest tightness  Physical Exam  BP (!) 133/105 (BP Location: Left Arm)   Pulse (!) 106   Resp 20   Ht 5\' 4"  (1.626 m)   Wt 78.5 kg   LMP 11/17/2020 (Exact Date) Comment: G1P0  SpO2 98%   BMI 29.70 kg/m  Gen:   Awake, no distress   Resp:  Normal effort  MSK:   Moves extremities without difficulty Other:    Medical Decision Making  Medically screening exam initiated at 5:38 PM.  Appropriate orders placed.  Antonietta Lansdowne was informed that the remainder of the evaluation will be completed by another provider, this initial triage assessment does not replace that evaluation, and the importance of remaining in the ED until their evaluation is complete.     Melrose Nakayama [place], PA-C 03/05/21 1739    03/07/21, MD 03/05/21 2123

## 2021-03-05 NOTE — Telephone Encounter (Signed)
Pt calling; still has brown d/c; doesn't understand why if subchorionic hemorrhage is gone.  [phone]  Mailbox not set up yet.

## 2021-03-05 NOTE — ED Triage Notes (Addendum)
Pt is [redacted] weeks pregnant and is having sharp abd pain to abd and radiates to back. Pt has had bleeding with this pregnancy in the past, has had subchorionic hemorrhage. Pt here for persistent brownish discharge and vaginal pressure and back.

## 2021-03-06 ENCOUNTER — Ambulatory Visit (INDEPENDENT_AMBULATORY_CARE_PROVIDER_SITE_OTHER): Payer: 59 | Admitting: Obstetrics and Gynecology

## 2021-03-06 VITALS — BP 120/70 | Ht 64.0 in | Wt 177.0 lb

## 2021-03-06 DIAGNOSIS — Z1159 Encounter for screening for other viral diseases: Secondary | ICD-10-CM

## 2021-03-06 DIAGNOSIS — Z3401 Encounter for supervision of normal first pregnancy, first trimester: Secondary | ICD-10-CM

## 2021-03-06 DIAGNOSIS — Z130 Encounter for screening for diseases of the blood and blood-forming organs and certain disorders involving the immune mechanism: Secondary | ICD-10-CM

## 2021-03-06 DIAGNOSIS — Z113 Encounter for screening for infections with a predominantly sexual mode of transmission: Secondary | ICD-10-CM

## 2021-03-06 DIAGNOSIS — Z3A15 15 weeks gestation of pregnancy: Secondary | ICD-10-CM

## 2021-03-06 DIAGNOSIS — O4402 Placenta previa specified as without hemorrhage, second trimester: Secondary | ICD-10-CM

## 2021-03-06 DIAGNOSIS — O4400 Placenta previa specified as without hemorrhage, unspecified trimester: Secondary | ICD-10-CM | POA: Insufficient documentation

## 2021-03-06 DIAGNOSIS — Z369 Encounter for antenatal screening, unspecified: Secondary | ICD-10-CM

## 2021-03-06 DIAGNOSIS — R300 Dysuria: Secondary | ICD-10-CM

## 2021-03-06 NOTE — Patient Instructions (Addendum)
Second Trimester of Pregnancy The second trimester of pregnancy is from week 13 through week 27. This is months 4 through 6 of pregnancy. The second trimester is often a time when you feel your best. Your body has adjusted to being pregnant, and you begin to feel better physically. During the second trimester: Morning sickness has lessened or stopped completely. You may have more energy. You may have an increase in appetite. The second trimester is also a time when the unborn baby (fetus) is growing rapidly. At the end of the sixth month, the fetus may be up to 12 inches long and weigh about 1 pounds. You will likely begin to feel the baby move (quickening) between 16 and 20 weeks of pregnancy. Body changes during your second trimester Your body continues to go through many changes during your second trimester. The changes vary and generally return to normal after the baby is born. Physical changes Your weight will continue to increase. You will notice your lower abdomen bulging out. You may begin to get stretch marks on your hips, abdomen, and breasts. Your breasts will continue to grow and to become tender. Dark spots or blotches (chloasma or mask of pregnancy) may develop on your face. A dark line from your belly button to the pubic area (linea nigra) may appear. You may have changes in your hair. These can include thickening of your hair, rapid growth, and changes in texture. Some people also have hair loss during or after pregnancy, or hair that feels dry or thin. Health changes You may develop headaches. You may have heartburn. You may develop constipation. You may develop hemorrhoids or swollen, bulging veins (varicose veins). Your gums may bleed and may be sensitive to brushing and flossing. You may urinate more often because the fetus is pressing on your bladder. You may have back pain. This is caused by: Weight gain. Pregnancy hormones that are relaxing the joints in your  pelvis. A shift in weight and the muscles that support your balance. Follow these instructions at home: Medicines Follow your health care provider's instructions regarding medicine use. Specific medicines may be either safe or unsafe to take during pregnancy. Do not take any medicines unless approved by your health care provider. Take a prenatal vitamin that contains at least 600 micrograms (mcg) of folic acid. Eating and drinking Eat a healthy diet that includes fresh fruits and vegetables, whole grains, good sources of protein such as meat, eggs, or tofu, and low-fat dairy products. Avoid raw meat and unpasteurized juice, milk, and cheese. These carry germs that can harm you and your baby. You may need to take these actions to prevent or treat constipation: Drink enough fluid to keep your urine pale yellow. Eat foods that are high in fiber, such as beans, whole grains, and fresh fruits and vegetables. Limit foods that are high in fat and processed sugars, such as fried or sweet foods. Activity Exercise only as directed by your health care provider. Most people can continue their usual exercise routine during pregnancy. Try to exercise for 30 minutes at least 5 days a week. Stop exercising if you develop contractions in your uterus. Stop exercising if you develop pain or cramping in the lower abdomen or lower back. Avoid exercising if it is very hot or humid or if you are at a high altitude. Avoid heavy lifting. If you choose to, you may have sex unless your health care provider tells you not to. Relieving pain and discomfort Wear a supportive bra  to prevent discomfort from breast tenderness. Take warm sitz baths to soothe any pain or discomfort caused by hemorrhoids. Use hemorrhoid cream if your health care provider approves. Rest with your legs raised (elevated) if you have leg cramps or low back pain. If you develop varicose veins: Wear support hose as told by your health care  provider. Elevate your feet for 15 minutes, 3-4 times a day. Limit salt in your diet. Safety Wear your seat belt at all times when driving or riding in a car. Talk with your health care provider if someone is verbally or physically abusive to you. Lifestyle Do not use hot tubs, steam rooms, or saunas. Do not douche. Do not use tampons or scented sanitary pads. Avoid cat litter boxes and soil used by cats. These carry germs that can cause birth defects in the baby and possibly loss of the fetus by miscarriage or stillbirth. Do not use herbal remedies, alcohol, illegal drugs, or medicines that are not approved by your health care provider. Chemicals in these products can harm your baby. Do not use any products that contain nicotine or tobacco, such as cigarettes, e-cigarettes, and chewing tobacco. If you need help quitting, ask your health care provider. General instructions During a routine prenatal visit, your health care provider will do a physical exam and other tests. He or she will also discuss your overall health. Keep all follow-up visits. This is important. Ask your health care provider for a referral to a local prenatal education class. Ask for help if you have counseling or nutritional needs during pregnancy. Your health care provider can offer advice or refer you to specialists for help with various needs. Where to find more information American Pregnancy Association: americanpregnancy.[place] and Gynecologists: PoolDevices.com.pt Office on [employer]: KeywordPortfolios.com.br Contact a health care provider if you have: A headache that does not go away when you take medicine. Vision changes or you see spots in front of your eyes. Mild pelvic cramps, pelvic pressure, or nagging pain in the abdominal area. Persistent nausea, vomiting, or diarrhea. A bad-smelling vaginal discharge or foul-smelling urine. Pain when you  urinate. Sudden or extreme swelling of your face, hands, ankles, feet, or legs. A fever. Get help right away if you: Have fluid leaking from your vagina. Have spotting or bleeding from your vagina. Have severe abdominal cramping or pain. Have difficulty breathing. Have chest pain. Have fainting spells. Have not felt your baby move for the time period told by your health care provider. Have new or increased pain, swelling, or redness in an arm or leg. Summary The second trimester of pregnancy is from week 13 through week 27 (months 4 through 6). Do not use herbal remedies, alcohol, illegal drugs, or medicines that are not approved by your health care provider. Chemicals in these products can harm your baby. Exercise only as directed by your health care provider. Most people can continue their usual exercise routine during pregnancy. Keep all follow-up visits. This is important. This information is not intended to replace advice given to you by your health care provider. Make sure you discuss any questions you have with your health care provider. Document Revised: 09/14/2019 Document Reviewed: 07/21/2019 Elsevier Patient Education  2022 [place] MEDICINES  SAFE IN PREGNANCY   COMPLAINT  MEDICINE                                                 Constipation Add fiber supplements such as Metamucil, Fibercon, or Citrucel.  Increasing fiber intake via bran cereals, oatmeal, leafy greens, and prunes is another alternative.  Increase you fluid intake.  You may also add Colace 120m by mouth twice daily or Senakot   Cough/Cold      Robitusin, Mucinex  Cuts and  Scrapes Bacitracin, Neosporin, Plysporin  Dental Pain     Tylenol. Avoid aspirin  products, which  could cause bleeding problems for mother and baby.  Also avoid ibuprofen products, such as Advil, Motrin or Aleve unless your doctor or nurse specifically  recommends these drugs.  Diarrhea Immodium, Kaopectate, or Donnagel PG .  Ensure adequate hydration by taking in plenty of clear liquids such as Gatorade, ginger ale, and jello.  Call if symptoms persist over 24-hrs.  Do NOT take Pepto-Bismol  Fever Take Tylenol *, Extra-Strength Tylenol *, or any aspirin-free pain reliever (acetaminophen).  Avoid aspirin products, which could cause bleeding problems for mother and baby.  Also avoid ibuprofen products, such as Advil, Motrin or Aleve unless your doctor or nurse specifically recommends these drugs.  If you temperature is over 101o F (38.3o C), call the  clinic  Gas    Gaviscon,, Mylicon,  Riopan    Headache   Tylenol. Avoid aspirin                                                                         products, which  could cause bleeding problems for mother and baby.  Also avoid ibuprofen products, such as Advil, Motrin or Aleve unless your doctor or nurse specifically recommends these drugs.  Head Lice     Nix  Heartburn/Indigestion TUMS, Rolaids, Zantac, Mylanta. Maalox   Avoid fatty, fried, or spicy foods.  Eat small frequent meals 5-6 times daily as opposed to 3 large meals a day.  Hemorrhoids Annusol HC, Tucks , Perperation H , or Dermaplast.  Warm sitz baths for 30 minutes twice daily.  Air dry and sleep without underwear.  Avoid constipation and see recommendation above for constipation as this may exacerbate/worsen you hemorrhoids  Leg Cramps     TUMS, Vitamin with Potassium  Leg Swelling  Elevate legs above                                                                                            waist, lay on your left side.  Well fitting  shoes and support  hose  Muscle Aches                Tylenol. Avoid aspirin products,  which  could cause bleeding problems for mother and baby.  Also avoid ibuprofen products, such as Advil, Motrin or Aleve unless your doctor or nurse specifically  recommends these drugs.  Nausea/Vomiting Emetrol.  Supportive measures to ensure you stay adequately hydrated.  Sips of juice, Gatorade, ginger ale 4 times an hour.  If you are unable to tolerate fluid intake for greater than 8hrs call the clinic.  You may want to allow ginger ale to go flat as carbonation my precipitate emesis.  Over the counter chewable prenatal vitamins are available.  Vitamin B6 (pyridoxine) 10 to 60m every 8hrs and doxylamine (Unisome sleep tabs) 213mat bedtime and 12.25m68mn the morning is first line.  Note that B6 supplements are not FDA regulated, there is a prescription of the above combination commercially available called Bonjesta.  Nosebleeds Apply ice pack to nose.  Particularly in winter time with dryer air, consider the use of a humidifier   Painful urination    Call clinic  Rash/ Itch Benadryl, Aveeno, Cortaid.  For severe whole body itching in the late second early third trimester call the office  Sleep Problems    Benadryl, Tylenol  PM, or Unisom  Seasonal Allergies    Claritin, Zyrtec,          Benadryl, Mucinex  Sinus Congestion Tylenol with Sudafed (pseudoephedrine no phenylephrine) or Actifed.  Sore Throat Chloroseptic lozenges and sprays such as Cepacol.  Salt water gargles (1 teaspoon of table salt in one quart of water)  Call if associated temperature above 101 Fahrenheit or 38.3 Celsius  Yeast Infection     Monistat   Pregnancy and Vaccinations Vaccines can help to keep you healthy. There are some vaccines that should be given before pregnancy and some that should be given during pregnancy. How does this affect me? If you are pregnant or thinking about getting pregnant, talk with your health care provider about what vaccines are right for you. How does this affect my baby? Usually, the benefits of receiving vaccines during pregnancy outweigh the risks of harm to you or your baby if: The risk of being exposed to a disease is  high. Infection would pose a risk to you or your unborn baby. The vaccine is not likely to cause harm. Vaccines can help protect your baby from some diseases until he or she is old enough to get the vaccine. What can I do to lower my risk? When you receive the recommended vaccines, it helps to protect you from getting certain diseases and passing them on to your baby. Should I receive vaccines before pregnancy? If possible, make sure that your vaccines are up to date before you become pregnant. It is safe and important for you to receive weakened viral and weakened bacterial vaccines (inactivated vaccines) as needed before you are pregnant. Live viral and live bacterial vaccines, such as the measles, mumps, and rubella (MMR) vaccine, should be given 1 month or more before pregnancy. Sometimes, women become pregnant within 1 month of receiving a live vaccine that is not usually recommended during pregnancy. The U.S. Centers for Disease Control and Prevention (CDC) has reported that when this has happened, vaccines have not harmed pregnant women or their unborn babies. Should I receive vaccines during pregnancy? It is safe and important for you to receive some inactivated vaccines as needed during pregnancy. Until your baby can receive vaccines, your baby will get some protection from diseases through the vaccines that you receive while you are pregnant. During  your pregnancy, you should receive the following: Influenza vaccine (the flu shot). The flu shot may protect you and your baby (up to 8 months of age) from some complications associated with strains of influenza that are covered by the vaccine. Pregnant women can receive the flu shot at any time during pregnancy. Tetanus, diphtheria, and pertussis (Tdap) vaccine. The Tdap vaccine will help to prevent whooping cough (pertussis) in you and your baby. You should receive 1 dose of this vaccine during each pregnancy. It is recommended that  pregnant women receive this vaccine between 27 and 36 weeks of pregnancy. Should I receive vaccines after pregnancy? It is safe and important for you to receive vaccines as needed after pregnancy. Some are safe to have if you are breastfeeding. Other vaccines may not be safe to have until after you have stopped breastfeeding. If you did not receive the Tdap vaccine during your pregnancy and have never received a Tdap vaccine, you should receive that vaccine right after you give birth to your baby (delivery). If you are not immune to measles, mumps, rubella, or chickenpox (varicella), you should receive those vaccines within days after delivery. It is important to talk with your health care provider about what vaccines you may need after delivery. What if I am pregnant and I plan to travel internationally? If you are pregnant and you are planning to travel internationally, talk with your health care provider at least 4-6 weeks before your trip. Depending on the country you are planning to visit, you may need to take special precautions or get certain vaccines to prevent disease. Vaccines that may be recommended for pregnant international travelers include: Influenza (the flu shot). Tetanus and diphtheria (Td) or Tdap. Hepatitis B (HepB). Hepatitis A (HepA). Your health care provider can help you decide if you need vaccines and if the benefits outweigh the risk of disease exposure. Follow these instructions at home: Take over-the-counter and prescription medicines as told by your health care provider. Keep all follow-up visits as told by your health care provider. This is important. Questions to ask your health care provider: What vaccines are safe during pregnancy? What are the risks of vaccines during pregnancy? What are the potential side effects of vaccines during or after pregnancy? When should I get vaccines during pregnancy? Contact a health care provider if you: Believe you have had a  reaction to a vaccine. Have concerns or questions about a vaccine. Become pregnant within 1 month after you have received a live vaccine. Summary Vaccines are the most effective way to prevent certain diseases. Many vaccines are safe to receive during pregnancy. Some vaccines are recommended during pregnancy to protect you and your baby from getting sick. If you are pregnant or planning to become pregnant, talk with your health care provider about what vaccines are right for you. This information is not intended to replace advice given to you by your health care provider. Make sure you discuss any questions you have with your health care provider. Document Revised: 09/14/2019 Document Reviewed: 05/12/2018 Elsevier Patient Education  2022 [place].    Activity Restriction During Pregnancy Your health care provider may recommend specific activity restrictions during pregnancy for a variety of reasons. Activity restriction may require that you limit activities that take a lot of effort, such as exercise, lifting, or sex. The type of activity restriction will vary depending on your risk or the problems you are having. Activity restriction may be recommended for a period of time until your baby  is delivered. Why are activity restrictions recommended? Activity restriction may be recommended if: Your placenta is partially or completely covering the opening of your cervix (placenta previa). There is bleeding between the wall of the uterus and the amniotic sac in the first trimester of pregnancy (subchorionic hemorrhage). You went into labor too early (preterm labor). You have a history of miscarriage. You have a condition that causes high blood pressure during pregnancy (preeclampsia or eclampsia). You are pregnant with more than one baby. Your baby is not growing well. What are the risks? Strict bed rest has physical and emotional risks and is no longer routinely recommended. The risks depend  on your specific restriction. Risks of strict bed rest include: Loss of muscle conditioning from not moving. Blood clots. Social isolation. Depression. Loss of income. Talk with your health care team about activity restriction to decide if it is best for you and your baby. Even if you are having problems during your pregnancy, you may be able to continue with normal levels of activity with careful monitoring by your health care provider. Follow these instructions at home: If needed, based on your overall health and the health of your baby, your health care provider will decide which type of activity restriction is right for you. Activity restrictions may include: Avoiding activities that take a lot of effort. Not lifting or straining. You may have to avoid lifting. Ask your health care provider how much you can safely lift. Resting in a sitting position or lying down for periods of time during the day. Pelvic rest may be recommended along with activity restrictions. If pelvic rest is recommended: Do not have sex or an orgasm. Do not use sexual stimulators. Do not use tampons. Do not douche. Do not put anything into your vagina. Avoid any activity in which your pelvic muscles could become strained, such as squatting or vigorous lower body exercises. Questions to ask your health care provider Why is my activity being limited? How will activity restrictions affect my body? Why is rest helpful for me and my baby? What activities can I do? When can I return to normal activities? Get help right away if: You have pain. This includes: Chest pain. Cramping in your lower abdomen. Pain in your upper abdomen. A low, dull backache. Severe headache that does not get better with pain medicine. You have regular contractions. You felt your water break (membranes rupture). You have other concerning symptoms such as: Blurry vision, or seeing spots or flashing lights. Noticing that your baby is not  moving as much as usual, or you are not feeling any movement. Redness, pain, or swelling in an arm or leg. Dizziness or feeling like you will faint. Difficulty breathing. You have vaginal discharge or vaginal bleeding. You have thoughts of hurting yourself. These symptoms may be an emergency. Get help right away. Call 911. Do not wait to see if the symptoms will go away. Do not drive yourself to the hospital. Get help right away if you feel like you may hurt yourself or others, or have thoughts about taking your own life. Go to your nearest emergency room or: Call 911. Call the [place] at [phone] or 988. This is open 24 hours a day. Text the Crisis Text Line at 8677829107. Summary Your health care provider may recommend specific activity restrictions during pregnancy for a variety of reasons. Activity restriction may require that you limit activities such as exercise, lifting, sex, or any other activity that requires a  lot of effort. Discuss the risks and benefits of activity restriction with your health care team to decide if it is best for you and your baby. Contact your health care provider right away if you think you are having contractions or you notice vaginal bleeding, discharge, or cramping. This information is not intended to replace advice given to you by your health care provider. Make sure you discuss any questions you have with your health care provider. Document Revised: 11/27/2020 Document Reviewed: 11/27/2020 Elsevier Patient Education  [place].

## 2021-03-06 NOTE — Progress Notes (Signed)
    Routine Prenatal Care Visit  Subjective  Kari Morales is a 25 y.o. G1P0 at [redacted]w[redacted]d being seen today for ongoing prenatal care.  She is currently monitored for the following issues for this low-risk pregnancy and has Migraine headache; UTI symptoms; Supervision of normal pregnancy; and Placenta previa on their problem list.  ----------------------------------------------------------------------------------- Patient reports no complaints.  Reports vaginal bleeding has resolved. Contractions: Not present. Vag. Bleeding: None.   . Denies leaking of fluid.  ----------------------------------------------------------------------------------- The following portions of the patient's history were reviewed and updated as appropriate: allergies, current medications, past family history, past medical history, past social history, past surgical history and problem list. Problem list updated.   Objective  Blood pressure 120/70, height 5\' 4"  (1.626 m), weight 177 lb (80.3 kg), last menstrual period 11/17/2020. Pregravid weight 155 lb (70.3 kg) Total Weight Gain 22 lb (9.979 kg) Urinalysis:      Fetal Status: Fetal Heart Rate (bpm): 150         General:  Alert, oriented and cooperative. Patient is in no acute distress.  Skin: Skin is warm and dry. No rash noted.   Cardiovascular: Normal heart rate noted  Respiratory: Normal respiratory effort, no problems with respiration noted  Abdomen: Soft, gravid, appropriate for gestational age. Pain/Pressure: Absent     Pelvic:  Cervical exam deferred        Extremities: Normal range of motion.  Edema: None  Mental Status: Normal mood and affect. Normal behavior. Normal judgment and thought content.     Assessment   25 y.o. G1P0 at [redacted]w[redacted]d by  08/24/2021, by Last Menstrual Period presenting for routine prenatal visit  Plan   [email or web address]  Problems (from 01/16/21 to present)     Problem Noted Resolved   Supervision of normal pregnancy 01/17/2021 by 01/19/2021, CNM No   Overview Addendum 03/06/2021  2:39 PM by 03/08/2021, MD     Nursing Staff Provider  Office Location  [facility] Dating  LMP, confirmed by 7wk [name]  Language  English Anatomy US    Flu Vaccine  Declines Genetic Screen  NIPS: nml xy  TDaP vaccine    Hgb A1C or  GTT Early : NA Third trimester :   Covid Unvaccinated   LAB RESULTS   Rhogam  Not needed Blood Type B POS  Feeding Plan Breast Antibody    Contraception  Rubella    Circumcision  RPR     Pediatrician   HBsAg     Support Person Raheen HIV    Prenatal Classes  Varicella     GBS  (For PCN allergy, check sensitivities)   BTL Consent     VBAC Consent  Pap  12/18/2020 NIL    Hgb Electro    Pelvis Tested  CF      SMA                  Discussed pelvic rest because of previa Discussed she should come to the hospital for any vaginal bleeding NOB labs today   Gestational age appropriate obstetric precautions including but not limited to vaginal bleeding, contractions, leaking of fluid and fetal movement were reviewed in detail with the patient.    No follow-ups on file.  12/20/2020 MD [facility], [facility] 03/06/2021, 2:44 PM

## 2021-03-07 NOTE — Telephone Encounter (Signed)
Per CRS's note from 03/06/21 "bleeding has resolved".

## 2021-03-08 LAB — RPR+RH+ABO+RUB AB+AB SCR+CB...
Antibody Screen: NEGATIVE
HIV Screen 4th Generation wRfx: NONREACTIVE
Hematocrit: 38.9 % (ref 34.0–46.6)
Hemoglobin: 13.8 g/dL (ref 11.1–15.9)
Hepatitis B Surface Ag: NEGATIVE
MCH: 28.8 pg (ref 26.6–33.0)
MCHC: 35.5 g/dL (ref 31.5–35.7)
MCV: 81 fL (ref 79–97)
Platelets: 167 10*3/uL (ref 150–450)
RBC: 4.8 x10E6/uL (ref 3.77–5.28)
RDW: 12.8 % (ref 11.7–15.4)
RPR Ser Ql: NONREACTIVE
Rh Factor: POSITIVE
Rubella Antibodies, IGG: <0.9 index — ABNORMAL LOW (ref >0.99)
Varicella zoster IgG: <135 index — ABNORMAL LOW (ref >165)
WBC: 15 10*3/uL — ABNORMAL HIGH (ref 3.4–10.8)

## 2021-03-08 LAB — HGB FRACTIONATION CASCADE
Hgb A2: 3.6 % — ABNORMAL HIGH (ref 1.8–3.2)
Hgb A: 55.7 % — ABNORMAL LOW (ref 96.4–98.8)
Hgb F: 0 % (ref 0.0–2.0)
Hgb S: 40.7 % — ABNORMAL HIGH

## 2021-03-08 LAB — HGB SOLUBILITY: Hgb Solubility: POSITIVE — AB

## 2021-03-08 LAB — HEPATITIS C ANTIBODY: Hep C Virus Ab: <0.1 s/co ratio (ref 0.0–0.9)

## 2021-03-11 ENCOUNTER — Telehealth: Payer: Self-pay

## 2021-03-11 NOTE — Telephone Encounter (Signed)
Pt calling; has seen her lab results; has questions about sickle cell and wants to know more about it. [phone]

## 2021-03-13 LAB — URINE CULTURE

## 2021-03-25 ENCOUNTER — Encounter: Payer: 59 | Admitting: Advanced Practice Midwife

## 2021-04-01 ENCOUNTER — Ambulatory Visit
Admission: RE | Admit: 2021-04-01 | Discharge: 2021-04-01 | Disposition: A | Discharge status: Home / Self Care | Payer: 59 | Source: Ambulatory Visit | Attending: Obstetrics & Gynecology | Admitting: Obstetrics & Gynecology

## 2021-04-01 ENCOUNTER — Other Ambulatory Visit: Payer: Self-pay

## 2021-04-01 DIAGNOSIS — Z3689 Encounter for other specified antenatal screening: Secondary | ICD-10-CM | POA: Insufficient documentation

## 2021-04-03 ENCOUNTER — Other Ambulatory Visit: Payer: Self-pay

## 2021-04-03 ENCOUNTER — Encounter: Payer: Self-pay | Admitting: Advanced Practice Midwife

## 2021-04-03 ENCOUNTER — Encounter: Payer: 59 | Admitting: Advanced Practice Midwife

## 2021-04-03 ENCOUNTER — Ambulatory Visit (INDEPENDENT_AMBULATORY_CARE_PROVIDER_SITE_OTHER): Payer: 59 | Admitting: Advanced Practice Midwife

## 2021-04-03 VITALS — BP 127/83 | Wt 183.6 lb

## 2021-04-03 DIAGNOSIS — Z3402 Encounter for supervision of normal first pregnancy, second trimester: Secondary | ICD-10-CM

## 2021-04-03 DIAGNOSIS — Z3A19 19 weeks gestation of pregnancy: Secondary | ICD-10-CM

## 2021-04-03 NOTE — Patient Instructions (Signed)
Placenta Previa The placenta is the organ formed during pregnancy. It carries oxygen and nutrients to the unborn baby (fetus). The placenta is the baby's life support system. Placenta previa happens when the placenta implants in the lower part of the uterus. The placenta either partially or completely covers the opening to the cervix. This can cause severe bleeding during late pregnancy or delivery. If placenta previa is diagnosed in the first half of pregnancy, the placenta may move into a normal position as the pregnancy progresses. It is important to keep all prenatal visits with your health care provider so that you can be closely monitored. What are the causes? The cause of this condition is not known. What increases the risk? The following factors may make you more likely to develop this condition: Having scars on the lining of the uterus. Having had previous pregnancies. Having had surgeries involving the uterus, such as a cesarean delivery. Having a history of placenta previa. Having smoked or used cocaine during pregnancy. Being 26 years of age or older during pregnancy. What are the signs or symptoms? The main symptom of this condition is sudden, painless, bright red vaginal bleeding during the second half of pregnancy. The amount of bleeding can be very light at first, and it usually stops on its own. Heavier bleeding episodes may also happen. Some women with placenta previa may have no bleeding at all. How is this diagnosed? This condition may be diagnosed during a routine ultrasound or during a checkup after vaginal bleeding is noticed. In general: If you are diagnosed with placenta previa, digital exams, which use the fingers, will be avoided. Your health care provider will still perform a speculum exam. If you did not have an ultrasound during your pregnancy, placenta previa may not be diagnosed until bleeding occurs during labor. How is this treated? Treatment for this condition  depends on: How much you are bleeding, or whether the bleeding has stopped. How far along you are in your pregnancy. The condition of your baby. How much of the placenta is covering the cervix. Treatment may include: Decreased activity. Bed rest at home or in the hospital. Pelvic rest. Nothing is placed inside the vagina during pelvic rest. This means not having sex, not using tampons, and not using douches. A blood transfusion to replace blood that you have lost (maternal blood loss). A cesarean delivery. This may be performed if: The bleeding is heavy and cannot be controlled. The placenta completely covers the cervix. Medicines to stop premature labor or to help the baby's lungs to mature. This treatment may be used if you need to deliver your baby before your pregnancy is full-term. Follow these instructions at home: Get plenty of rest and reduce activity as told by your health care provider. If told by your health care provider, stay on bed rest for the length of time that is recommended. Do not have sex, use tampons, douche, or place anything inside of your vagina if your health care provider recommends pelvic rest. Take over-the-counter and prescription medicines only as told by your health care provider. Keep all follow-up visits. This is important. Get help right away if: You have vaginal bleeding, even if in small amounts and even if you have no pain. You have cramping or regular contractions. You have pain in your abdomen or your lower back. You have a feeling of increased pressure in your pelvis. You have increased watery or bloody mucus from your vagina. You have not felt your baby moving regularly.  Summary Placenta previa is a condition in which the placenta implants in the lower part of the uterus in a pregnant woman. The cause of this condition is not known. The most common symptom of placenta previa is painless, bright red bleeding during pregnancy. It is important to  keep all prenatal visits with your health care provider so you can be closely monitored. Get help right away if you have placenta previa and you are experiencing bleeding during pregnancy. This information is not intended to replace advice given to you by your health care provider. Make sure you discuss any questions you have with your health care provider. Document Revised: 11/28/2019 Document Reviewed: 11/28/2019 Elsevier Patient Education  2022 [employer].

## 2021-04-03 NOTE — Progress Notes (Signed)
Routine Prenatal Care Visit  Subjective  Kari Morales is a 25 y.o. G1P0 at [redacted]w[redacted]d being seen today for ongoing prenatal care.  She is currently monitored for the following issues for this low-risk pregnancy and has Migraine headache; UTI symptoms; Supervision of normal pregnancy; and Placenta previa on their problem list.  ----------------------------------------------------------------------------------- Patient reports no complaints.  We discussed placenta previa precautions/restrictions. Contractions: Not present. Vag. Bleeding: None.  Movement: Present. Leaking Fluid denies.  ----------------------------------------------------------------------------------- The following portions of the patient's history were reviewed and updated as appropriate: allergies, current medications, past family history, past medical history, past social history, past surgical history and problem list. Problem list updated.  Objective  Blood pressure 127/83, weight 183 lb 9.6 oz (83.3 kg), last menstrual period 11/17/2020. Pregravid weight 155 lb (70.3 kg) Total Weight Gain 28 lb 9.6 oz (13 kg) Urinalysis: Urine Protein    Urine Glucose    Fetal Status: Fetal Heart Rate (bpm): 147 Fundal Height: 19 cm Movement: Present      Anatomy scan: 04/01/21: complete, normal, female, breech, placenta anterior/lateral/PREVIA  General:  Alert, oriented and cooperative. Patient is in no acute distress.  Skin: Skin is warm and dry. No rash noted.   Cardiovascular: Normal heart rate noted  Respiratory: Normal respiratory effort, no problems with respiration noted  Abdomen: Soft, gravid, appropriate for gestational age. Pain/Pressure: Absent     Pelvic:  Cervical exam deferred        Extremities: Normal range of motion.  Edema: None  Mental Status: Normal mood and affect. Normal behavior. Normal judgment and thought content.   Assessment   25 y.o. G1P0 at [redacted]w[redacted]d by  08/24/2021, by Last Menstrual Period presenting for  routine prenatal visit  Plan   [email or web address]  Problems (from 01/16/21 to present)    Problem Noted Resolved   Placenta previa 03/06/2021 by [name], MD No   Overview Addendum 03/06/2021  2:43 PM by [name], MD    Complete Previa on 15 wk [place] Discussed Pelvic Rest      Supervision of normal pregnancy 01/17/2021 by Tresea Mall, CNM No   Overview Addendum 03/06/2021  2:42 PM by [name], MD     Nursing Staff Provider  Office Location  [facility] Dating  LMP, confirmed by 7wk [place]  Language  English Anatomy US    Flu Vaccine  Declines Genetic Screen  NIPS: nml xy  TDaP vaccine    Hgb A1C or  GTT Early : NA Third trimester :   Covid Unvaccinated   LAB RESULTS   Rhogam  Not needed Blood Type B POS  Feeding Plan Breast Antibody    Contraception  Rubella    Circumcision  RPR     Pediatrician   HBsAg     Support Person Raheen HIV    Prenatal Classes  Varicella     GBS  (For PCN allergy, check sensitivities)   BTL Consent     VBAC Consent  Pap  12/18/2020 NIL    Hgb Electro    Pelvis Tested  CF      SMA         PREVIA at 15 weeks          Preterm labor symptoms and general obstetric precautions including but not limited to vaginal bleeding, contractions, leaking of fluid and fetal movement were reviewed in detail with the patient. Please refer to After Visit Summary for other counseling recommendations.   Return in about 4 weeks (around 05/01/2021)  for rob.  Tresea Mall, CNM 04/03/2021 2:18 PM

## 2021-05-01 ENCOUNTER — Other Ambulatory Visit: Payer: Self-pay

## 2021-05-01 ENCOUNTER — Encounter: Payer: Self-pay | Admitting: Advanced Practice Midwife

## 2021-05-01 ENCOUNTER — Ambulatory Visit (INDEPENDENT_AMBULATORY_CARE_PROVIDER_SITE_OTHER): Payer: Medicaid Other | Admitting: Advanced Practice Midwife

## 2021-05-01 VITALS — BP 134/84 | Wt 190.0 lb

## 2021-05-01 DIAGNOSIS — Z3A23 23 weeks gestation of pregnancy: Secondary | ICD-10-CM

## 2021-05-01 DIAGNOSIS — Z131 Encounter for screening for diabetes mellitus: Secondary | ICD-10-CM

## 2021-05-01 DIAGNOSIS — O4402 Placenta previa specified as without hemorrhage, second trimester: Secondary | ICD-10-CM

## 2021-05-01 DIAGNOSIS — Z130 Encounter for screening for diseases of the blood and blood-forming organs and certain disorders involving the immune mechanism: Secondary | ICD-10-CM

## 2021-05-01 DIAGNOSIS — Z3402 Encounter for supervision of normal first pregnancy, second trimester: Secondary | ICD-10-CM

## 2021-05-01 DIAGNOSIS — Z369 Encounter for antenatal screening, unspecified: Secondary | ICD-10-CM

## 2021-05-01 DIAGNOSIS — Z113 Encounter for screening for infections with a predominantly sexual mode of transmission: Secondary | ICD-10-CM

## 2021-05-01 NOTE — Progress Notes (Signed)
No vb. No lof.  

## 2021-05-01 NOTE — Progress Notes (Signed)
Routine Prenatal Care Visit  Subjective  Kari Morales is a 26 y.o. G1P0 at [redacted]w[redacted]d being seen today for ongoing prenatal care.  She is currently monitored for the following issues for this low-risk pregnancy and has Migraine headache; UTI symptoms; Supervision of normal pregnancy; and Placenta previa on their problem list.  ----------------------------------------------------------------------------------- Patient reports heartburn.   Contractions: Not present. Vag. Bleeding: None.  Movement: Present. Leaking Fluid denies.  ----------------------------------------------------------------------------------- The following portions of the patient's history were reviewed and updated as appropriate: allergies, current medications, past family history, past medical history, past social history, past surgical history and problem list. Problem list updated.  Objective  Blood pressure 134/84, weight 190 lb (86.2 kg), last menstrual period 11/17/2020. Pregravid weight 155 lb (70.3 kg) Total Weight Gain 35 lb (15.9 kg) Urinalysis: Urine Protein    Urine Glucose    Fetal Status: Fetal Heart Rate (bpm): 145 Fundal Height: 24 cm Movement: Present     General:  Alert, oriented and cooperative. Patient is in no acute distress.  Skin: Skin is warm and dry. No rash noted.   Cardiovascular: Normal heart rate noted  Respiratory: Normal respiratory effort, no problems with respiration noted  Abdomen: Soft, gravid, appropriate for gestational age. Pain/Pressure: Absent     Pelvic:  Cervical exam deferred        Extremities: Normal range of motion.  Edema: None  Mental Status: Normal mood and affect. Normal behavior. Normal judgment and thought content.   Assessment   26 y.o. G1P0 at [redacted]w[redacted]d by  08/24/2021, by Last Menstrual Period presenting for routine prenatal visit  Plan   [email or web address]  Problems (from 01/16/21 to present)    Problem Noted Resolved   Placenta previa 03/06/2021 by [name], MD No    Overview Addendum 03/06/2021  2:43 PM by [name], MD    Complete Previa on 15 wk [place] Discussed Pelvic Rest      Supervision of normal pregnancy 01/17/2021 by Tresea Mall, CNM No   Overview Addendum 03/06/2021  2:42 PM by [name], MD     Nursing Staff Provider  Office Location  [facility] Dating  LMP, confirmed by 7wk [place]  Language  English Anatomy US    Flu Vaccine  Declines Genetic Screen  NIPS: nml xy  TDaP vaccine    Hgb A1C or  GTT Early : NA Third trimester :   Covid Unvaccinated   LAB RESULTS   Rhogam  Not needed Blood Type B POS  Feeding Plan Breast Antibody    Contraception  Rubella    Circumcision  RPR     Pediatrician   HBsAg     Support Person Raheen HIV    Prenatal Classes  Varicella     GBS  (For PCN allergy, check sensitivities)   BTL Consent     VBAC Consent  Pap  12/18/2020 NIL    Hgb Electro    Pelvis Tested  CF      SMA         PREVIA at 15 weeks          Preterm labor symptoms and general obstetric precautions including but not limited to vaginal bleeding, contractions, leaking of fluid and fetal movement were reviewed in detail with the patient. Please refer to After Visit Summary for other counseling recommendations.   Return in about 4 weeks (around 05/29/2021) for placentation u/s and 28 wk labs and rob.  Tresea Mall, CNM 05/01/2021 4:38 PM

## 2021-05-23 DIAGNOSIS — Z3A25 25 weeks gestation of pregnancy: Secondary | ICD-10-CM | POA: Diagnosis not present

## 2021-05-23 DIAGNOSIS — O42019 Preterm premature rupture of membranes, onset of labor within 24 hours of rupture, unspecified trimester: Secondary | ICD-10-CM | POA: Diagnosis not present

## 2021-05-24 DIAGNOSIS — Z20822 Contact with and (suspected) exposure to covid-19: Secondary | ICD-10-CM | POA: Diagnosis not present

## 2021-05-24 DIAGNOSIS — Z3A26 26 weeks gestation of pregnancy: Secondary | ICD-10-CM | POA: Diagnosis not present

## 2021-05-24 DIAGNOSIS — O42912 Preterm premature rupture of membranes, unspecified as to length of time between rupture and onset of labor, second trimester: Secondary | ICD-10-CM | POA: Diagnosis not present

## 2021-05-24 DIAGNOSIS — O4402 Placenta previa specified as without hemorrhage, second trimester: Secondary | ICD-10-CM | POA: Diagnosis not present

## 2021-05-27 ENCOUNTER — Other Ambulatory Visit: Payer: Self-pay

## 2021-05-27 ENCOUNTER — Ambulatory Visit
Admission: RE | Admit: 2021-05-27 | Discharge: 2021-05-27 | Disposition: A | Discharge status: Home / Self Care | Payer: Medicaid Other | Source: Ambulatory Visit | Attending: Advanced Practice Midwife | Admitting: Advanced Practice Midwife

## 2021-05-27 DIAGNOSIS — O4412 Placenta previa with hemorrhage, second trimester: Secondary | ICD-10-CM | POA: Diagnosis not present

## 2021-05-27 DIAGNOSIS — Z3A27 27 weeks gestation of pregnancy: Secondary | ICD-10-CM | POA: Insufficient documentation

## 2021-05-27 DIAGNOSIS — Z3402 Encounter for supervision of normal first pregnancy, second trimester: Secondary | ICD-10-CM

## 2021-05-27 DIAGNOSIS — O4402 Placenta previa specified as without hemorrhage, second trimester: Secondary | ICD-10-CM | POA: Insufficient documentation

## 2021-05-27 DIAGNOSIS — Z369 Encounter for antenatal screening, unspecified: Secondary | ICD-10-CM

## 2021-05-30 ENCOUNTER — Other Ambulatory Visit: Payer: Self-pay

## 2021-05-30 ENCOUNTER — Encounter: Payer: Self-pay | Admitting: Obstetrics and Gynecology

## 2021-05-30 ENCOUNTER — Ambulatory Visit (INDEPENDENT_AMBULATORY_CARE_PROVIDER_SITE_OTHER): Payer: Medicaid Other | Admitting: Obstetrics and Gynecology

## 2021-05-30 ENCOUNTER — Other Ambulatory Visit: Payer: Medicaid Other

## 2021-05-30 VITALS — BP 120/72 | Ht 64.0 in | Wt 190.0 lb

## 2021-05-30 DIAGNOSIS — Z369 Encounter for antenatal screening, unspecified: Secondary | ICD-10-CM

## 2021-05-30 DIAGNOSIS — Z130 Encounter for screening for diseases of the blood and blood-forming organs and certain disorders involving the immune mechanism: Secondary | ICD-10-CM | POA: Diagnosis not present

## 2021-05-30 DIAGNOSIS — Z131 Encounter for screening for diabetes mellitus: Secondary | ICD-10-CM

## 2021-05-30 DIAGNOSIS — Z3402 Encounter for supervision of normal first pregnancy, second trimester: Secondary | ICD-10-CM | POA: Diagnosis not present

## 2021-05-30 DIAGNOSIS — Z3A27 27 weeks gestation of pregnancy: Secondary | ICD-10-CM

## 2021-05-30 DIAGNOSIS — Z113 Encounter for screening for infections with a predominantly sexual mode of transmission: Secondary | ICD-10-CM | POA: Diagnosis not present

## 2021-05-30 DIAGNOSIS — O0990 Supervision of high risk pregnancy, unspecified, unspecified trimester: Secondary | ICD-10-CM

## 2021-05-30 LAB — POCT URINALYSIS DIPSTICK OB
Glucose, UA: NEGATIVE
POC,PROTEIN,UA: NEGATIVE

## 2021-05-30 NOTE — Patient Instructions (Addendum)
Third Trimester of Pregnancy The third trimester of pregnancy is from week 28 through week 40. This is months 7 through 9. The third trimester is a time when the unborn baby (fetus) is growing rapidly. At the end of the ninth month, the fetus is about 20 inches long and weighs 6-10 pounds. Body changes during your third trimester During the third trimester, your body will continue to go through many changes. The changes vary and generally return to normal after your baby is born. Physical changes Your weight will continue to increase. You can expect to gain 25-35 pounds (11-16 kg) by the end of the pregnancy if you begin pregnancy at a normal weight. If you are underweight, you can expect to gain 28-40 lb (about 13-18 kg), and if you are overweight, you can expect to gain 15-25 lb (about 7-11 kg). You may begin to get stretch marks on your hips, abdomen, and breasts. Your breasts will continue to grow and may hurt. A yellow fluid (colostrum) may leak from your breasts. This is the first milk you are producing for your baby. You may have changes in your hair. These can include thickening of your hair, rapid growth, and changes in texture. Some people also have hair loss during or after pregnancy, or hair that feels dry or thin. Your belly button may stick out. You may notice more swelling in your hands, face, or ankles. Health changes You may have heartburn. You may have constipation. You may develop hemorrhoids. You may develop swollen, bulging veins (varicose veins) in your legs. You may have increased body aches in the pelvis, back, or thighs. This is due to weight gain and increased hormones that are relaxing your joints. You may have increased tingling or numbness in your hands, arms, and legs. The skin on your abdomen may also feel numb. You may feel short of breath because of your expanding uterus. Other changes You may urinate more often because the fetus is moving lower into your pelvis  and pressing on your bladder. You may have more problems sleeping. This may be caused by the size of your abdomen, an increased need to urinate, and an increase in your body's metabolism. You may notice the fetus "dropping," or moving lower in your abdomen (lightening). You may have increased vaginal discharge. You may notice that you have pain around your pelvic bone as your uterus distends. Follow these instructions at home: Medicines Follow your health care provider's instructions regarding medicine use. Specific medicines may be either safe or unsafe to take during pregnancy. Do not take any medicines unless approved by your health care provider. Take a prenatal vitamin that contains at least 600 micrograms (mcg) of folic acid. Eating and drinking Eat a healthy diet that includes fresh fruits and vegetables, whole grains, good sources of protein such as meat, eggs, or tofu, and low-fat dairy products. Avoid raw meat and unpasteurized juice, milk, and cheese. These carry germs that can harm you and your baby. Eat 4 or 5 small meals rather than 3 large meals a day. You may need to take these actions to prevent or treat constipation: Drink enough fluid to keep your urine pale yellow. Eat foods that are high in fiber, such as beans, whole grains, and fresh fruits and vegetables. Limit foods that are high in fat and processed sugars, such as fried or sweet foods. Activity Exercise only as directed by your health care provider. Most people can continue their usual exercise routine during pregnancy. Try to  exercise for 30 minutes at least 5 days a week. Stop exercising if you experience contractions in the uterus. °Stop exercising if you develop pain or cramping in the lower abdomen or lower back. °Avoid heavy lifting. °Do not exercise if it is very hot or humid or if you are at a high altitude. °If you choose to, you may continue to have sex unless your health care provider tells you not  to. °Relieving pain and discomfort °Take frequent breaks and rest with your legs raised (elevated) if you have leg cramps or low back pain. °Take warm sitz baths to soothe any pain or discomfort caused by hemorrhoids. Use hemorrhoid cream if your health care provider approves. °Wear a supportive bra to prevent discomfort from breast tenderness. °If you develop varicose veins: °Wear support hose as told by your health care provider. °Elevate your feet for 15 minutes, 3-4 times a day. °Limit salt in your diet. °Safety °Talk to your health care provider before traveling far distances. °Do not use hot tubs, steam rooms, or saunas. °Wear your seat belt at all times when driving or riding in a car. °Talk with your health care provider if someone is verbally or physically abusive to you. °Preparing for birth °To prepare for the arrival of your baby: °Take prenatal classes to understand, practice, and ask questions about labor and delivery. °Visit the hospital and tour the maternity area. °Purchase a rear-facing car seat and make sure you know how to install it in your car. °Prepare the baby's room or sleeping area. Make sure to remove all pillows and stuffed animals from the baby's crib to prevent suffocation. °General instructions °Avoid cat litter boxes and soil used by cats. These carry germs that can cause birth defects in the baby. If you have a cat, ask someone to clean the litter box for you. °Do not douche or use tampons. Do not use scented sanitary pads. °Do not use any products that contain nicotine or tobacco, such as cigarettes, e-cigarettes, and chewing tobacco. If you need help quitting, ask your health care provider. °Do not use any herbal remedies, illegal drugs, or medicines that were not prescribed to you. Chemicals in these products can harm your baby. °Do not drink alcohol. °You will have more frequent prenatal exams during the third trimester. During a routine prenatal visit, your health care provider  will do a physical exam, perform tests, and discuss your overall health. Keep all follow-up visits. This is important. °Where to find more information °American Pregnancy Association: americanpregnancy.org °American College of Obstetricians and Gynecologists: acog.org/en/Womens%20Health/Pregnancy °Office on Women's Health: womenshealth.gov/pregnancy °Contact a health care provider if you have: °A fever. °Mild pelvic cramps, pelvic pressure, or nagging pain in your abdominal area or lower back. °Vomiting or diarrhea. °Bad-smelling vaginal discharge or foul-smelling urine. °Pain when you urinate. °A headache that does not go away when you take medicine. °Visual changes or see spots in front of your eyes. °Get help right away if: °Your water breaks. °You have regular contractions less than 5 minutes apart. °You have spotting or bleeding from your vagina. °You have severe abdominal pain. °You have difficulty breathing. °You have chest pain. °You have fainting spells. °You have not felt your baby move for the time period told by your health care provider. °You have new or increased pain, swelling, or redness in an arm or leg. °Summary °The third trimester of pregnancy is from week 28 through week 40 (months 7 through 9). °You may have more problems sleeping.   This can be caused by the size of your abdomen, an increased need to urinate, and an increase in your body's metabolism. You will have more frequent prenatal exams during the third trimester. Keep all follow-up visits. This is important. This information is not intended to replace advice given to you by your health care provider. Make sure you discuss any questions you have with your health care provider. Document Revised: 09/14/2019 Document Reviewed: 07/21/2019 Elsevier Patient Education  2022 [employer] Placenta Previa The placenta is the organ formed during pregnancy. It carries oxygen and nutrients to the unborn baby (fetus). The placenta is the baby's  life support system. Placenta previa happens when the placenta implants in the lower part of the uterus. The placenta either partially or completely covers the opening to the cervix. This can cause severe bleeding during late pregnancy or delivery. If placenta previa is diagnosed in the first half of pregnancy, the placenta may move into a normal position as the pregnancy progresses. It is important to keep all prenatal visits with your health care provider so that you can be closely monitored. What are the causes? The cause of this condition is not known. What increases the risk? The following factors may make you more likely to develop this condition: Having scars on the lining of the uterus. Having had previous pregnancies. Having had surgeries involving the uterus, such as a cesarean delivery. Having a history of placenta previa. Having smoked or used cocaine during pregnancy. Being 21 years of age or older during pregnancy. What are the signs or symptoms? The main symptom of this condition is sudden, painless, bright red vaginal bleeding during the second half of pregnancy. The amount of bleeding can be very light at first, and it usually stops on its own. Heavier bleeding episodes may also happen. Some women with placenta previa may have no bleeding at all. How is this diagnosed? This condition may be diagnosed during a routine ultrasound or during a checkup after vaginal bleeding is noticed. In general: If you are diagnosed with placenta previa, digital exams, which use the fingers, will be avoided. Your health care provider will still perform a speculum exam. If you did not have an ultrasound during your pregnancy, placenta previa may not be diagnosed until bleeding occurs during labor. How is this treated? Treatment for this condition depends on: How much you are bleeding, or whether the bleeding has stopped. How far along you are in your pregnancy. The condition of your baby. How  much of the placenta is covering the cervix. Treatment may include: Decreased activity. Bed rest at home or in the hospital. Pelvic rest. Nothing is placed inside the vagina during pelvic rest. This means not having sex, not using tampons, and not using douches. A blood transfusion to replace blood that you have lost (maternal blood loss). A cesarean delivery. This may be performed if: The bleeding is heavy and cannot be controlled. The placenta completely covers the cervix. Medicines to stop premature labor or to help the baby's lungs to mature. This treatment may be used if you need to deliver your baby before your pregnancy is full-term. Follow these instructions at home: Get plenty of rest and reduce activity as told by your health care provider. If told by your health care provider, stay on bed rest for the length of time that is recommended. Do not have sex, use tampons, douche, or place anything inside of your vagina if your health care provider recommends pelvic rest. Take over-the-counter  and prescription medicines only as told by your health care provider. Keep all follow-up visits. This is important. Get help right away if: You have vaginal bleeding, even if in small amounts and even if you have no pain. You have cramping or regular contractions. You have pain in your abdomen or your lower back. You have a feeling of increased pressure in your pelvis. You have increased watery or bloody mucus from your vagina. You have not felt your baby moving regularly. Summary Placenta previa is a condition in which the placenta implants in the lower part of the uterus in a pregnant woman. The cause of this condition is not known. The most common symptom of placenta previa is painless, bright red bleeding during pregnancy. It is important to keep all prenatal visits with your health care provider so you can be closely monitored. Get help right away if you have placenta previa and you are  experiencing bleeding during pregnancy. This information is not intended to replace advice given to you by your health care provider. Make sure you discuss any questions you have with your health care provider. Document Revised: 11/28/2019 Document Reviewed: 11/28/2019 Elsevier Patient Education  2022 [employer].

## 2021-05-30 NOTE — Progress Notes (Signed)
Routine Prenatal Care Visit  Subjective  Kari Morales is a 26 y.o. G1P0 at [redacted]w[redacted]d being seen today for ongoing prenatal care.  She is currently monitored for the following issues for this high-risk pregnancy and has Migraine headache; UTI symptoms; Supervision of high risk pregnancy, antepartum; and Placenta previa on their problem list.  ----------------------------------------------------------------------------------- Patient reports no complaints.   Contractions: Not present. Vag. Bleeding: None.  Movement: Present. Denies leaking of fluid.  ----------------------------------------------------------------------------------- The following portions of the patient's history were reviewed and updated as appropriate: allergies, current medications, past family history, past medical history, past social history, past surgical history and problem list. Problem list updated.   Objective  Blood pressure 120/72, height 5\' 4"  (1.626 m), weight 190 lb (86.2 kg), last menstrual period 11/17/2020. Pregravid weight 155 lb (70.3 kg) Total Weight Gain 35 lb (15.9 kg) Urinalysis:      Fetal Status:     Movement: Present     General:  Alert, oriented and cooperative. Patient is in no acute distress.  Skin: Skin is warm and dry. No rash noted.   Cardiovascular: Normal heart rate noted  Respiratory: Normal respiratory effort, no problems with respiration noted  Abdomen: Soft, gravid, appropriate for gestational age. Pain/Pressure: Absent     Pelvic:  Cervical exam deferred        Extremities: Normal range of motion.  Edema: None  Mental Status: Normal mood and affect. Normal behavior. Normal judgment and thought content.     Assessment   26 y.o. G1P0 at [redacted]w[redacted]d by  08/24/2021, by Last Menstrual Period presenting for routine prenatal visit  Plan   pregnancy1  Problems (from 01/16/21 to present)    pregnancy1  Problems (from 01/16/21 to present)     Problem Noted Resolved   Placenta previa  03/06/2021 by 03/08/2021, MD No   Overview Addendum 03/06/2021  2:43 PM by 03/08/2021, MD    Complete Previa on 15 wk Kari Morales Discussed Pelvic Rest      Supervision of high risk pregnancy, antepartum 01/17/2021 by 01/19/2021, CNM No   Overview Addendum 05/30/2021  9:31 AM by 07/28/2021, MD     Nursing Staff Provider  Office Location  [facility] Dating  LMP, confirmed by 7wk Kari Morales  Language  English Anatomy US  Complete- PREVIA  Flu Vaccine  Declines Genetic Screen  NIPS: nml xy  TDaP vaccine    Hgb A1C or  GTT Early : NA Third trimester :   Covid Unvaccinated   LAB RESULTS   Rhogam  Not needed Blood Type B POS  Feeding Plan Breast Antibody Negative (11/16 1444)  Contraception  Rubella <0.90 (11/16 1444)  Circumcision  RPR Non Reactive (11/16 1444)   Pediatrician   HBsAg Negative (11/16 1444)   Support Person Kari Morales HIV Non Reactive (11/16 1444)  Prenatal Classes  Varicella  NONIMMUNE    GBS  (For PCN allergy, check sensitivities)   BTL Consent     VBAC Consent  Pap  12/18/2020 NIL    Hgb Electro   sickle cell trait  Pelvis Tested  CF      SMA        PREVIA at 28 weeks         Discussed need for cesarean delivery with Previa 28 week labs today Patient in process of transferring care with moving.   Gestational age appropriate obstetric precautions including but not limited to vaginal bleeding, contractions, leaking of fluid and fetal movement  were reviewed in detail with the patient.    Return in about 2 weeks (around 06/13/2021) for ROB in person.  Kari Milch MD [facility], [facility] 05/30/2021, 9:32 AM

## 2021-05-30 NOTE — Addendum Note (Signed)
Addended by: Clement Husbands A on: 05/30/2021 03:32 PM   Modules accepted: Orders

## 2021-05-31 LAB — 28 WEEK RH+PANEL
Basophils Absolute: 0 10*3/uL (ref 0.0–0.2)
Basos: 0 %
EOS (ABSOLUTE): 0.1 10*3/uL (ref 0.0–0.4)
Eos: 1 %
Gestational Diabetes Screen: 156 mg/dL — ABNORMAL HIGH (ref 70–139)
HIV Screen 4th Generation wRfx: NONREACTIVE
Hematocrit: 40.8 % (ref 34.0–46.6)
Hemoglobin: 13.7 g/dL (ref 11.1–15.9)
Immature Grans (Abs): 0.1 10*3/uL (ref 0.0–0.1)
Immature Granulocytes: 1 %
Lymphocytes Absolute: 1.6 10*3/uL (ref 0.7–3.1)
Lymphs: 14 %
MCH: 27.5 pg (ref 26.6–33.0)
MCHC: 33.6 g/dL (ref 31.5–35.7)
MCV: 82 fL (ref 79–97)
Monocytes Absolute: 0.6 10*3/uL (ref 0.1–0.9)
Monocytes: 5 %
Neutrophils Absolute: 8.6 10*3/uL — ABNORMAL HIGH (ref 1.4–7.0)
Neutrophils: 79 %
Platelets: 159 10*3/uL (ref 150–450)
RBC: 4.99 x10E6/uL (ref 3.77–5.28)
RDW: 12.8 % (ref 11.7–15.4)
RPR Ser Ql: NONREACTIVE
WBC: 10.9 10*3/uL — ABNORMAL HIGH (ref 3.4–10.8)

## 2021-06-03 ENCOUNTER — Other Ambulatory Visit: Payer: Self-pay | Admitting: Advanced Practice Midwife

## 2021-06-03 DIAGNOSIS — R7309 Other abnormal glucose: Secondary | ICD-10-CM

## 2021-06-03 NOTE — Progress Notes (Signed)
3 hr gtt ordered. Message to patient regarding same.

## 2021-06-14 ENCOUNTER — Ambulatory Visit (INDEPENDENT_AMBULATORY_CARE_PROVIDER_SITE_OTHER): Payer: Medicaid Other | Admitting: Licensed Practical Nurse

## 2021-06-14 ENCOUNTER — Other Ambulatory Visit: Payer: Self-pay

## 2021-06-14 ENCOUNTER — Encounter: Payer: Self-pay | Admitting: Licensed Practical Nurse

## 2021-06-14 ENCOUNTER — Other Ambulatory Visit: Payer: Medicaid Other

## 2021-06-14 VITALS — BP 120/62 | Wt 191.0 lb

## 2021-06-14 DIAGNOSIS — R7309 Other abnormal glucose: Secondary | ICD-10-CM | POA: Diagnosis not present

## 2021-06-14 DIAGNOSIS — Z3A29 29 weeks gestation of pregnancy: Secondary | ICD-10-CM

## 2021-06-14 DIAGNOSIS — O0990 Supervision of high risk pregnancy, unspecified, unspecified trimester: Secondary | ICD-10-CM

## 2021-06-14 LAB — POCT URINALYSIS DIPSTICK OB
Glucose, UA: NEGATIVE
POC,PROTEIN,UA: NEGATIVE

## 2021-06-14 NOTE — Progress Notes (Signed)
Routine Prenatal Care Visit  Subjective  Danajah Galley is a 26 y.o. G1P0 at [redacted]w[redacted]d being seen today for ongoing prenatal care.  She is currently monitored for the following issues for this high-risk pregnancy and has Migraine headache; UTI symptoms; Supervision of high risk pregnancy, antepartum; and Placenta previa on their problem list.  ----------------------------------------------------------------------------------- Patient reports cramping, thinks she maybe constipated.  -Here for 3 hour glucose, seen early for ROB as she we in the waiting room -Anticipatory guidance given regarding c-section, encouraged her to spend time meditating on this plan as it was not what she was planning when she became pregnant, admits to feeling scared about surgery. Reviewed family centered c-section-music, drop the drape, and skin to skin in the OR.  -Long conversation on breastfeeding, what to expect in the first few days, the importance of stimulating the breast frequently and only using formula in the beginning if the Ped recommends it. Also discussed long term goals of breastfeeding.  -Partner present for later half of visit.   Contractions: Not present. Vag. Bleeding: None.  Movement: Present. Leaking Fluid denies.  ----------------------------------------------------------------------------------- The following portions of the patient's history were reviewed and updated as appropriate: allergies, current medications, past family history, past medical history, past social history, past surgical history and problem list. Problem list updated.  Objective  Blood pressure 120/62, weight 191 lb (86.6 kg), last menstrual period 11/17/2020. Pregravid weight 155 lb (70.3 kg) Total Weight Gain 36 lb (16.3 kg) Urinalysis: Urine Protein Negative  Urine Glucose Negative  Fetal Status: Fetal Heart Rate (bpm): 140 Fundal Height: 30 cm Movement: Present     General:  Alert, oriented and cooperative. Patient is in no  acute distress.  Skin: Skin is warm and dry. No rash noted.   Cardiovascular: Normal heart rate noted  Respiratory: Normal respiratory effort, no problems with respiration noted  Abdomen: Soft, gravid, appropriate for gestational age. Pain/Pressure: Present     Pelvic:  Cervical exam deferred        Extremities: Normal range of motion.  Edema: None  Mental Status: Normal mood and affect. Normal behavior. Normal judgment and thought content.   Assessment   26 y.o. G1P0 at [redacted]w[redacted]d by  08/24/2021, by Last Menstrual Period presenting for routine prenatal visit  Plan   pregnancy1  Problems (from 01/16/21 to present)     Problem Noted Resolved   Placenta previa 03/06/2021 by Homero Fellers, MD No   Overview Addendum 03/06/2021  2:43 PM by Homero Fellers, MD    Complete Previa on 15 wk [place] Discussed Pelvic Rest      Supervision of high risk pregnancy, antepartum 01/17/2021 by Rod Can, CNM No   Overview Addendum 05/30/2021  9:31 AM by Homero Fellers, MD     Nursing Staff Provider  Office Location  [facility] Dating  LMP, confirmed by 7wk [place]  Language  English Anatomy US  Complete- PREVIA  Flu Vaccine  Declines Genetic Screen  NIPS: nml xy  TDaP vaccine    Hgb A1C or  GTT Early : NA Third trimester :   Covid Unvaccinated   LAB RESULTS   Rhogam  Not needed Blood Type B POS  Feeding Plan Breast Antibody Negative (11/16 1444)  Contraception  Rubella <0.90 (11/16 1444)  Circumcision  RPR Non Reactive (11/16 1444)   Pediatrician   HBsAg Negative (11/16 1444)   Support Person Raheen HIV Non Reactive (11/16 1444)  Prenatal Classes  Varicella  NONIMMUNE    GBS  (  For PCN allergy, check sensitivities)   BTL Consent     VBAC Consent  Pap  12/18/2020 NIL    Hgb Electro   sickle cell trait  Pelvis Tested  CF      SMA         PREVIA at 28 weeks           Preterm labor symptoms and general obstetric precautions including but not limited to vaginal bleeding,  contractions, leaking of fluid and fetal movement were reviewed in detail with the patient. Please refer to After Visit Summary for other counseling recommendations.   Return in about 2 weeks (around 06/28/2021) for ROB, needs to see OB for c/s conscent .  Roberto Scales, CNM  [facility]osetta Pigeon, [facility]  06/14/21  4:42 PM

## 2021-06-15 LAB — GESTATIONAL GLUCOSE TOLERANCE
Glucose, Fasting: 74 mg/dL (ref 70–94)
Glucose, GTT - 1 Hour: 161 mg/dL (ref 70–179)
Glucose, GTT - 2 Hour: 142 mg/dL (ref 70–154)
Glucose, GTT - 3 Hour: 139 mg/dL (ref 70–139)

## 2021-07-02 ENCOUNTER — Other Ambulatory Visit: Payer: Self-pay

## 2021-07-02 ENCOUNTER — Encounter: Payer: Self-pay | Admitting: Obstetrics and Gynecology

## 2021-07-02 ENCOUNTER — Ambulatory Visit (INDEPENDENT_AMBULATORY_CARE_PROVIDER_SITE_OTHER): Payer: Medicaid Other | Admitting: Obstetrics and Gynecology

## 2021-07-02 VITALS — BP 120/80 | Wt 194.0 lb

## 2021-07-02 DIAGNOSIS — Z23 Encounter for immunization: Secondary | ICD-10-CM

## 2021-07-02 DIAGNOSIS — O0990 Supervision of high risk pregnancy, unspecified, unspecified trimester: Secondary | ICD-10-CM

## 2021-07-02 DIAGNOSIS — O4403 Placenta previa specified as without hemorrhage, third trimester: Secondary | ICD-10-CM

## 2021-07-02 NOTE — Progress Notes (Signed)
? ?  PRENATAL VISIT NOTE ? ?Subjective:  ?Kari Morales is a 26 y.o. G1P0 at [redacted]w[redacted]d being seen today for ongoing prenatal care.  She is currently monitored for the following issues for this high-risk pregnancy and has Migraine headache; UTI symptoms; Supervision of high risk pregnancy, antepartum; and Placenta previa on their problem list. ? ?Patient reports no complaints.  Contractions: Not present. Vag. Bleeding: None.  Movement: Present. Denies leaking of fluid.  ? ?The following portions of the patient's history were reviewed and updated as appropriate: allergies, current medications, past family history, past medical history, past social history, past surgical history and problem list.  ? ?Objective:  ? ?Vitals:  ? 07/02/21 1018  ?BP: 120/80  ?Weight: 194 lb (88 kg)  ? ? ?Fetal Status: Fetal Heart Rate (bpm): 150 Fundal Height: 32 cm Movement: Present    ? ?General:  Alert, oriented and cooperative. Patient is in no acute distress.  ?Skin: Skin is warm and dry. No rash noted.   ?Cardiovascular: Normal heart rate noted  ?Respiratory: Normal respiratory effort, no problems with respiration noted  ?Abdomen: Soft, gravid, appropriate for gestational age.  Pain/Pressure: Present     ?Pelvic: Cervical exam deferred        ?Extremities: Normal range of motion.  Edema: None  ?Mental Status: Normal mood and affect. Normal behavior. Normal judgment and thought content.  ? ?Assessment and Plan:  ?Pregnancy: G1P0 at [redacted]w[redacted]d ?1. Need for diphtheria-tetanus-pertussis (Tdap) vaccine ?Tdap today ? ?2. Supervision of high risk pregnancy, antepartum ?Patient is doing well without complaints ?Plans pills for contraception ?Patient is still researching pediatrician ? ?3. Placenta previa in third trimester ?Persistent previa seen on recent ultrasound ?Discussed plan for primary cesarean section. Risks, benefits and alternatives were explained including but not limited to risks of bleeding, infection and damage to adjacent  organs ?Patient verbalized understanding and all questions were answered ? ?Preterm labor symptoms and general obstetric precautions including but not limited to vaginal bleeding, contractions, leaking of fluid and fetal movement were reviewed in detail with the patient. ?Please refer to After Visit Summary for other counseling recommendations.  ? ?Return in about 2 weeks (around 07/16/2021) for in person, ROB, High risk. ? ?No future appointments. ? ?Catalina Antigua, MD ? ?

## 2021-07-04 ENCOUNTER — Telehealth: Payer: Self-pay | Admitting: Obstetrics and Gynecology

## 2021-07-04 NOTE — Telephone Encounter (Signed)
Called patient to schedule cesarean section w [facility] ? ?DOS 08/01/21 ? ?H&P 4/6 @ 11:15  ? ?Pre-admit phone call appointment to be requested - date and time will be included on H&P paper work. Also all appointments will be updated on pt MyChart. Explained that this appointment has a call window. Based on the time scheduled will indicate if the call will be received within a 4 hour window before 1:00 or after. ? ?Advised that pt may also receive calls from the hospital pharmacy and pre-service center. ? ?Confirmed pt has [employer]. ?No secondary insurance.  ? ?Patient advised that she has a new address and I was able to update her chart. ?

## 2021-07-04 NOTE — Telephone Encounter (Signed)
-----   Message from Catalina Antigua, MD sent at 07/04/2021  3:04 PM EDT ----- ?You may ? ?Thank you! ? ?Peggy ?----- Message ----- ?From: Hawley, April H ?Sent: 07/04/2021   2:11 PM EDT ?To: Catalina Antigua, MD ? ?I spoke with Dr Jerene Pitch and she can do the cesarean section on Thursday, 4/13. [facility] doesn't schedule Saturday/Sunday c/s. ? ?Shall I proceed with scheduling her on 4/13 for cesarean section with Dr Jerene Pitch? ? ?Mickel Fuchs ? ?----- Message ----- ?From: Catalina Antigua, MD ?Sent: 07/04/2021  12:06 PM EDT ?To: April H Hawley ? ?It has to be before 4/15.  ? ?Is there not a provider on call 4/15? Is that not an option? ?Otherwise we may have to adjust my 4/12 schedule with plans for am office hours and afternoon OR time and schedule her for noon. ? ?I spoke with Dr. Erin Fulling and there is not a policy indicating that an MD is required to call to schedule an induction of labor or cesarean section. If you don't mind scheduling her at noon  on 4/12 (if there are no other alternatives), please let me know and I will put in my orders. ? ? ?Thank you ? ?Peggy ? ? ?----- Message ----- ?From: Hawley, April H ?Sent: 07/02/2021   3:09 PM EDT ?To: Catalina Antigua, MD ? ?You are here on 4/18. I could schedule a 9:30 or 12:00 start and adjust your clinic time. The 7:30 slot is taken on that day. ? ? ?----- Message ----- ?From: Catalina Antigua, MD ?Sent: 07/02/2021  11:36 AM EDT ?To: April H Hawley ? ?Patient needs to be scheduled for primary cesarean section between 36-37 weeks. Patient prefers 37 weeks which falls on Saturday 4/15 ? ?What options do we have to coordinate this cesarean section for this patient? ? ?Peggy ? ? ? ? ? ?

## 2021-07-05 NOTE — Telephone Encounter (Signed)
Erskine Squibb, can you plan to assist [facility] with this C/S on Thursday, 4/13? The case is scheduled for 7:30. ?

## 2021-07-08 ENCOUNTER — Telehealth: Payer: Self-pay

## 2021-07-08 DIAGNOSIS — I10 Essential (primary) hypertension: Secondary | ICD-10-CM | POA: Diagnosis not present

## 2021-07-08 NOTE — Telephone Encounter (Signed)
Pt calling; 33wks; having spasms on spine; had one this morning and has been hurting since.  [phone]  Pt states last couple days has had like spasms from neck to her bottom; headaches; hands tight and numb but not swollen.  Adv e.s. tylenol two every 6 hrs while awake and 16oz caffeine a day; apply heat to back; wrist splints. Pt wanted to know what to do if spine kept hurting; adv to be seen; no answer; offered pt to let me have scheduler call her; she states she had rather go to the ER; adv that was fine; pt wanted to go to [facility] ER; adv she could. ?

## 2021-07-08 NOTE — Telephone Encounter (Signed)
Orders placed.

## 2021-07-16 ENCOUNTER — Encounter: Payer: Self-pay | Admitting: Obstetrics and Gynecology

## 2021-07-16 ENCOUNTER — Ambulatory Visit (INDEPENDENT_AMBULATORY_CARE_PROVIDER_SITE_OTHER): Payer: Medicaid Other | Admitting: Obstetrics and Gynecology

## 2021-07-16 ENCOUNTER — Other Ambulatory Visit: Payer: Self-pay

## 2021-07-16 VITALS — BP 122/62 | Wt 203.0 lb

## 2021-07-16 DIAGNOSIS — O0990 Supervision of high risk pregnancy, unspecified, unspecified trimester: Secondary | ICD-10-CM

## 2021-07-16 DIAGNOSIS — O4403 Placenta previa specified as without hemorrhage, third trimester: Secondary | ICD-10-CM

## 2021-07-16 DIAGNOSIS — Z3A34 34 weeks gestation of pregnancy: Secondary | ICD-10-CM

## 2021-07-16 LAB — POCT URINALYSIS DIPSTICK OB
Glucose, UA: NEGATIVE
POC,PROTEIN,UA: NEGATIVE

## 2021-07-16 NOTE — Progress Notes (Signed)
? ?  PRENATAL VISIT NOTE ? ?Subjective:  ?Kari Morales is a 26 y.o. G1P0 at [redacted]w[redacted]d being seen today for ongoing prenatal care.  She is currently monitored for the following issues for this high-risk pregnancy and has Migraine headache; UTI symptoms; Supervision of high risk pregnancy, antepartum; and Placenta previa on their problem list. ? ?Patient reports no complaints.  Contractions: Not present. Vag. Bleeding: None.  Movement: Present. Denies leaking of fluid.  ? ?The following portions of the patient's history were reviewed and updated as appropriate: allergies, current medications, past family history, past medical history, past social history, past surgical history and problem list.  ? ?Objective:  ? ?Vitals:  ? 07/16/21 1033  ?BP: 122/62  ?Weight: 203 lb (92.1 kg)  ? ? ?Fetal Status:     Movement: Present    ? ?General:  Alert, oriented and cooperative. Patient is in no acute distress.  ?Skin: Skin is warm and dry. No rash noted.   ?Cardiovascular: Normal heart rate noted  ?Respiratory: Normal respiratory effort, no problems with respiration noted  ?Abdomen: Soft, gravid, appropriate for gestational age.  Pain/Pressure: Present     ?Pelvic: Cervical exam deferred        ?Extremities: Normal range of motion.     ?Mental Status: Normal mood and affect. Normal behavior. Normal judgment and thought content.  ? ?Assessment and Plan:  ?Pregnancy: G1P0 at [redacted]w[redacted]d ?1. Supervision of high risk pregnancy, antepartum ?Patient is doing well without complaints ?Patient plans to contact chosen pediatrician office today to ensure that they accept her insurance ? ?2. Placenta previa in third trimester ?Patient scheduled for primary cesarean section on 08/01/21 ? ?Preterm labor symptoms and general obstetric precautions including but not limited to vaginal bleeding, contractions, leaking of fluid and fetal movement were reviewed in detail with the patient. ?Please refer to After Visit Summary for other counseling  recommendations.  ? ?Return in about 2 weeks (around 07/30/2021) for in person, ROB, High risk. ? ?Future Appointments  ?Date Time Provider [place]  ?07/23/2021  9:00 AM [facility] PAT2 [facility] None  ?07/25/2021 11:15 AM [facility], Stefanie Libel, MD [facility] None  ?07/30/2021  9:00 AM [facility] PAT4 [facility] None  ?08/12/2021  1:35 PM Dj Senteno, Vickii Chafe, MD [facility] None  ? ? ?Mora Bellman, MD ? ?

## 2021-07-23 ENCOUNTER — Other Ambulatory Visit
Admission: RE | Admit: 2021-07-23 | Discharge: 2021-07-23 | Disposition: A | Discharge status: Home / Self Care | Payer: Medicaid Other | Source: Ambulatory Visit | Attending: Obstetrics and Gynecology | Admitting: Obstetrics and Gynecology

## 2021-07-23 ENCOUNTER — Other Ambulatory Visit: Payer: Self-pay

## 2021-07-23 NOTE — Patient Instructions (Addendum)
Your procedure is scheduled on: 08/01/21 - Thursday ?Report to the [facility] Emergency Department at 5:45 am on 08/01/21.  ? ?REMEMBER: ?Instructions that are not followed completely may result in serious medical risk, up to and including death; or upon the discretion of your surgeon and anesthesiologist your surgery may need to be rescheduled. ? ?Do not eat food or drink any fluids after midnight the night before surgery.  ?No gum chewing, lozengers or hard candies. ? ?TAKE THESE MEDICATIONS THE MORNING OF SURGERY WITH A SIP OF WATER: NONE ? ?One week prior to surgery: ?Stop Anti-inflammatories (NSAIDS) such as Advil, Aleve, Ibuprofen, Motrin, Naproxen, Naprosyn and Aspirin based products such as Excedrin, Goodys Powder, BC Powder. ? ?Stop ANY OVER THE COUNTER supplements until after surgery. ? ?You may however, continue to take Tylenol if needed for pain up until the day of surgery. ? ?No Alcohol for 24 hours before or after surgery. ? ?No Smoking including e-cigarettes for 24 hours prior to surgery.  ?No chewable tobacco products for at least 6 hours prior to surgery.  ?No nicotine patches on the day of surgery. ? ?Do not use any "recreational" drugs for at least a week prior to your surgery.  ?Please be advised that the combination of cocaine and anesthesia may have negative outcomes, up to and including death. ?If you test positive for cocaine, your surgery will be cancelled. ? ?On the morning of surgery brush your teeth with toothpaste and water, you may rinse your mouth with mouthwash if you wish. ?Do not swallow any toothpaste or mouthwash. ? ?Use CHG Soap or wipes as directed on instruction sheet. ? ?Do not wear jewelry, make-up, hairpins, clips or nail polish. ? ?Do not wear lotions, powders, or perfumes.  ? ?Do not shave body from the neck down 48 hours prior to surgery just in case you cut yourself which could leave a site for infection.  ?Also, freshly shaved skin may become irritated if using the CHG  soap. ? ?Contact lenses, hearing aids and dentures may not be worn into surgery. ? ?Do not bring valuables to the hospital. [facility] is not responsible for any missing/lost belongings or valuables.  ? ?Notify your doctor if there is any change in your medical condition (cold, fever, infection). ? ?Wear comfortable clothing (specific to your surgery type) to the hospital. ? ?After surgery, you can help prevent lung complications by doing breathing exercises.  ?Take deep breaths and cough every 1-2 hours. Your doctor may order a device called an Incentive Spirometer to help you take deep breaths. ?When coughing or sneezing, hold a pillow firmly against your incision with both hands. This is called ?splinting.? Doing this helps protect your incision. It also decreases belly discomfort. ? ?If you are being admitted to the hospital overnight, leave your suitcase in the car. ?After surgery it may be brought to your room. ? ?If you are being discharged the day of surgery, you will not be allowed to drive home. ?You will need a responsible adult (18 years or older) to drive you home and stay with you that night.  ? ?If you are taking public transportation, you will need to have a responsible adult (18 years or older) with you. ?Please confirm with your physician that it is acceptable to use public transportation.  ? ?Please call the Pre-admissions Testing Dept. at [phone] if you have any questions about these instructions. ? ?Surgery Visitation Policy: ? ?Patients undergoing a surgery or procedure may have two family  members or support persons with them as long as the person is not COVID-19 positive or experiencing its symptoms.  ? ?Inpatient Visitation:   ? ?Visiting hours are 7 a.m. to 8 p.m. ?Up to four visitors are allowed at one time in a patient room, including children. The visitors may rotate out with other people during the day. One designated support person (adult) may remain overnight.  ?

## 2021-07-25 ENCOUNTER — Ambulatory Visit (INDEPENDENT_AMBULATORY_CARE_PROVIDER_SITE_OTHER): Payer: Medicaid Other | Admitting: Obstetrics and Gynecology

## 2021-07-25 ENCOUNTER — Other Ambulatory Visit: Payer: Self-pay

## 2021-07-25 ENCOUNTER — Other Ambulatory Visit (HOSPITAL_COMMUNITY)
Admission: RE | Admit: 2021-07-25 | Discharge: 2021-07-25 | Disposition: A | Discharge status: Home / Self Care | Payer: Medicaid Other | Source: Ambulatory Visit | Attending: Obstetrics and Gynecology | Admitting: Obstetrics and Gynecology

## 2021-07-25 ENCOUNTER — Encounter: Payer: Self-pay | Admitting: Obstetrics & Gynecology

## 2021-07-25 ENCOUNTER — Observation Stay
Admission: EM | Admit: 2021-07-25 | Discharge: 2021-07-26 | Disposition: A | Discharge status: Home / Self Care | Payer: Medicaid Other | Attending: Advanced Practice Midwife | Admitting: Advanced Practice Midwife

## 2021-07-25 VITALS — BP 140/80 | Wt 207.0 lb

## 2021-07-25 DIAGNOSIS — O0990 Supervision of high risk pregnancy, unspecified, unspecified trimester: Secondary | ICD-10-CM

## 2021-07-25 DIAGNOSIS — Z3A35 35 weeks gestation of pregnancy: Secondary | ICD-10-CM

## 2021-07-25 DIAGNOSIS — O4403 Placenta previa specified as without hemorrhage, third trimester: Secondary | ICD-10-CM | POA: Insufficient documentation

## 2021-07-25 DIAGNOSIS — O0993 Supervision of high risk pregnancy, unspecified, third trimester: Secondary | ICD-10-CM | POA: Diagnosis not present

## 2021-07-25 DIAGNOSIS — O163 Unspecified maternal hypertension, third trimester: Principal | ICD-10-CM | POA: Diagnosis present

## 2021-07-25 DIAGNOSIS — Z87891 Personal history of nicotine dependence: Secondary | ICD-10-CM | POA: Insufficient documentation

## 2021-07-25 DIAGNOSIS — O26899 Other specified pregnancy related conditions, unspecified trimester: Secondary | ICD-10-CM

## 2021-07-25 DIAGNOSIS — O26893 Other specified pregnancy related conditions, third trimester: Secondary | ICD-10-CM | POA: Insufficient documentation

## 2021-07-25 DIAGNOSIS — G5600 Carpal tunnel syndrome, unspecified upper limb: Secondary | ICD-10-CM | POA: Insufficient documentation

## 2021-07-25 LAB — COMPREHENSIVE METABOLIC PANEL
ALT: 18 U/L (ref 0–44)
AST: 25 U/L (ref 15–41)
Albumin: 2.9 g/dL — ABNORMAL LOW (ref 3.5–5.0)
Alkaline Phosphatase: 196 U/L — ABNORMAL HIGH (ref 38–126)
Anion gap: 7 (ref 5–15)
BUN: 6 mg/dL (ref 6–20)
CO2: 21 mmol/L — ABNORMAL LOW (ref 22–32)
Calcium: 8.6 mg/dL — ABNORMAL LOW (ref 8.9–10.3)
Chloride: 106 mmol/L (ref 98–111)
Creatinine, Ser: 0.72 mg/dL (ref 0.44–1.00)
GFR, Estimated: >60 mL/min (ref >60)
Glucose, Bld: 100 mg/dL — ABNORMAL HIGH (ref 70–99)
Potassium: 3.6 mmol/L (ref 3.5–5.1)
Sodium: 134 mmol/L — ABNORMAL LOW (ref 135–145)
Total Bilirubin: 0.6 mg/dL (ref 0.3–1.2)
Total Protein: 6.4 g/dL — ABNORMAL LOW (ref 6.5–8.1)

## 2021-07-25 LAB — CBC
HCT: 36.3 % (ref 36.0–46.0)
Hemoglobin: 12.5 g/dL (ref 12.0–15.0)
MCH: 27.1 pg (ref 26.0–34.0)
MCHC: 34.4 g/dL (ref 30.0–36.0)
MCV: 78.6 fL — ABNORMAL LOW (ref 80.0–100.0)
Platelets: 140 10*3/uL — ABNORMAL LOW (ref 150–400)
RBC: 4.62 MIL/uL (ref 3.87–5.11)
RDW: 13.5 % (ref 11.5–15.5)
WBC: 10.8 10*3/uL — ABNORMAL HIGH (ref 4.0–10.5)
nRBC: 0 % (ref 0.0–0.2)

## 2021-07-25 LAB — PROTEIN / CREATININE RATIO, URINE
Creatinine, Urine: 79 mg/dL
Protein Creatinine Ratio: 0.24 mg/mg{Cre} — ABNORMAL HIGH (ref 0.00–0.15)
Total Protein, Urine: 19 mg/dL

## 2021-07-25 LAB — POCT URINALYSIS DIPSTICK OB
Glucose, UA: NEGATIVE
POC,PROTEIN,UA: NEGATIVE

## 2021-07-25 LAB — TYPE AND SCREEN
ABO/RH(D): B POS
Antibody Screen: NEGATIVE

## 2021-07-25 MED ORDER — LABETALOL HCL 200 MG PO TABS
200.0000 mg | ORAL_TABLET | Freq: Two times a day (BID) | ORAL | Status: DC
Start: 2021-07-25 — End: 2021-07-27
  Administered 2021-07-26 (×2): 200 mg via ORAL
  Filled 2021-07-25 (×2): qty 1

## 2021-07-25 MED ORDER — LABETALOL HCL 100 MG PO TABS
ORAL_TABLET | ORAL | Status: AC
  Administered 2021-07-25: 200 mg via ORAL
  Filled 2021-07-25: qty 2

## 2021-07-25 MED ORDER — ONDANSETRON HCL 4 MG/2ML IJ SOLN: 4.0000 mg | Freq: Four times a day (QID) | INTRAMUSCULAR | Status: DC | PRN

## 2021-07-25 MED ORDER — LIDOCAINE HCL (PF) 1 % IJ SOLN
30.0000 mL | INTRAMUSCULAR | Status: DC | PRN
Start: 2021-07-25 — End: 2021-07-27
  Filled 2021-07-25: qty 30

## 2021-07-25 MED ORDER — ACETAMINOPHEN 325 MG PO TABS
650.0000 mg | ORAL_TABLET | ORAL | Status: DC | PRN
  Administered 2021-07-25: 650 mg via ORAL
  Filled 2021-07-25: qty 2

## 2021-07-25 MED ORDER — BETAMETHASONE SOD PHOS & ACET 6 (3-3) MG/ML IJ SUSP
12.0000 mg | INTRAMUSCULAR | Status: AC
  Administered 2021-07-25 – 2021-07-26 (×2): 12 mg via INTRAMUSCULAR
  Filled 2021-07-25: qty 2
  Filled 2021-07-25: qty 5

## 2021-07-25 NOTE — Progress Notes (Signed)
ROB- no concerns 

## 2021-07-25 NOTE — Progress Notes (Signed)
Kari Morales is an 26 y.o. female.   ?Chief Complaint: Placenta previa ?HPI: Patient presents today for preoperative visit for planned cesarean delivery next week.  She is feeling well.  She notes that she has had increased swelling in both of her hands.  She notes that she is losing sensation in some of her fingers.  She reports that she has also been having on and off headaches.  She currently has a headache.  She denies vision changes.  She notes normal fetal movement.  She reports that she has not been having any vaginal bleeding or uterine contractions. ? ?pregnancy1  Problems (from 01/16/21 to present)   ? ? Problem Noted Resolved  ? Placenta previa 03/06/2021 by [name], MD No  ? Overview Addendum 03/06/2021  2:43 PM by [name], MD  ?  Complete Previa on 15 wk [place] ?Discussed Pelvic Rest ?  ?  ? Supervision of high risk pregnancy, antepartum 01/17/2021 by Tresea Mall, CNM No  ? Overview Addendum 05/30/2021  9:31 AM by [name], MD  ?   ?Nursing Staff Provider  ?Office Location  [facility] Dating  LMP, confirmed by 7wk [place]  ?Language  English Anatomy US  Complete- PREVIA  ?Flu Vaccine  Declines Genetic Screen  NIPS: nml xy  ?TDaP vaccine   07/02/2021 Hgb A1C or  ?GTT Early : NA ?Third trimester : 156 ?3hr: normal  ?Covid Unvaccinated   LAB RESULTS   ?Rhogam  Not needed Blood Type B POS  ?Feeding Plan Breast Antibody Negative (11/16 1444)  ?Contraception  Rubella <0.90 (11/16 1444)  ?Circumcision  RPR Non Reactive (11/16 1444)   ?Pediatrician   HBsAg Negative (11/16 1444)   ?Support Person Raheen HIV Non Reactive (11/16 1444)  ?Prenatal Classes  Varicella  NONIMMUNE  ?  GBS  (For PCN allergy, check sensitivities)   ?BTL Consent     ?VBAC Consent  Pap  12/18/2020 NIL  ?  Hgb Electro   sickle cell trait  ?Pelvis Tested  CF   ?   SMA   ?     ?PREVIA at 28 weeks ?  ?  ? ?  ? ? ? ?Past Medical History:  ?Diagnosis Date  ? Hypertension   ? history  ? Migraine   ? PCOS (polycystic  ovarian syndrome)   ? ? ?Past Surgical History:  ?Procedure Laterality Date  ? DENTAL SURGERY    ? ganglian cyst removal in left hand    ? ~2007 or 2008 per pt  ? GANGLION CYST EXCISION Left   ? x2  ? ? ?Family History  ?Problem Relation Age of Onset  ? Hypertension Mother   ? Cancer Paternal Grandfather   ? Prostate cancer Paternal Grandfather   ? Breast cancer Half-Sister   ? ?Social History:  reports that she quit smoking about 10 months ago. Her smoking use included e-cigarettes and cigars. She has never used smokeless tobacco. She reports that she does not currently use alcohol. She reports that she does not use drugs. ? ?Allergies:  ?Allergies  ?Allergen Reactions  ? Shrimp [Shellfish Allergy] Anaphylaxis  ? ? ?(Not in a hospital admission) ? ? ?No results found for this or any previous visit (from the past 48 hour(s)). ?No results found. ? ?Review of Systems  ?Constitutional:  Negative for chills and fever.  ?HENT:  Negative for congestion, hearing loss and sinus pain.   ?Respiratory:  Negative for cough, shortness of breath and wheezing.   ?  Cardiovascular:  Negative for chest pain, palpitations and leg swelling.  ?Gastrointestinal:  Negative for abdominal pain, constipation, diarrhea, nausea and vomiting.  ?Genitourinary:  Negative for dysuria, flank pain, frequency, hematuria and urgency.  ?Musculoskeletal:  Negative for back pain.  ?Skin:  Negative for rash.  ?Neurological:  Negative for dizziness and headaches.  ?Psychiatric/Behavioral:  Negative for suicidal ideas. The patient is not nervous/anxious.   ? ?Blood pressure 140/80, weight 207 lb (93.9 kg), last menstrual period 11/17/2020. ?Physical Exam ?Vitals and nursing note reviewed.  ?Constitutional:   ?   Appearance: Normal appearance. She is well-developed.  ?HENT:  ?   Head: Normocephalic and atraumatic.  ?Cardiovascular:  ?   Rate and Rhythm: Normal rate and regular rhythm.  ?Pulmonary:  ?   Effort: Pulmonary effort is normal.  ?   Breath sounds:  Normal breath sounds.  ?Abdominal:  ?   General: Bowel sounds are normal.  ?   Palpations: Abdomen is soft.  ?Musculoskeletal:     ?   General: Normal range of motion.  ?Skin: ?   General: Skin is warm and dry.  ?Neurological:  ?   Mental Status: She is alert and oriented to person, place, and time.  ?Psychiatric:     ?   Behavior: Behavior normal.     ?   Thought Content: Thought content normal.     ?   Judgment: Judgment normal.  ?  ? ?Assessment/Plan ?26 y.o. G1P0 at [redacted]w[redacted]d with history placenta previa who desires an elective primary cesarean section.  ? ?Discussed risks, benefits, and alternatives with the patient for the cesarean section.  Discussed the risk of infection bleeding and damage to surrounding pelvic tissues including but not limited to the uterus, fallopian tubes, ovaries, bowel, and bladder.  Discussed proper care for the incision postoperatively to avoid infection and signs and symptoms of infection to watch for.  Discussed possibility of an emergent blood transfusion for heavy blood loss.  Discussed risks associated with blood transfusion including transfusion reaction and chronic infections, or sepsis which could lead to death.  Patient gave consent for the procedure and blood transfusion if needed. Consent forms were completed. ? ?Patient having elevated blood pressures mild range and headaches today.  We will send her to labor and delivery for further evaluation. ? ?GBS and gonorrhea / chlamydia collected today. ? ?We will refer to orthopedics for evaluation of carpal tunnel syndrome. ? ?[name], MD ?07/25/2021, 11:26 AM ? ? ? ?

## 2021-07-25 NOTE — H&P (Signed)
Obstetrics Admission History & Physical ?  ?PIH evaluation ? ? ?HPI:  ?26 y.o. G1P0 @ [redacted]w[redacted]d (08/24/2021, by Last Menstrual Period). Admitted on 07/25/2021:   ?Patient Active Problem List  ? Diagnosis Date Noted  ? Hypertension during pregnancy in third trimester, unspecified hypertension in pregnancy type 07/25/2021  ? Placenta previa 03/06/2021  ? Supervision of high risk pregnancy, antepartum 01/17/2021  ? UTI symptoms 09/13/2020  ? Migraine headache 12/25/2017  ?  ? ?Presents for headaches and elevated blood pressures noted on office exam today.  Pt has h/o migraine headaches. She denies blurry vision, CP, SOB, epigastric pain, contractions, bleeding, ROM.  She has placenta previa, planing delivery by cesarean next week. ? ?[facility] at: at [facility]. Pregnancy complicated by placenta previa. ? ?ROS: A review of systems was performed and negative, except as stated in the above HPI. ?PMHx:  ?Past Medical History:  ?Diagnosis Date  ? Hypertension   ? history  ? Migraine   ? PCOS (polycystic ovarian syndrome)   ? ?PSHx:  ?Past Surgical History:  ?Procedure Laterality Date  ? DENTAL SURGERY    ? ganglian cyst removal in left hand    ? ~2007 or 2008 per pt  ? GANGLION CYST EXCISION Left   ? x2  ? ?Medications:  ?Medications Prior to Admission  ?Medication Sig Dispense Refill Last Dose  ? acetaminophen (TYLENOL) 500 MG tablet Take 500 mg by mouth every 6 (six) hours as needed.   07/24/2021  ? calcium carbonate (TUMS EX) 750 MG chewable tablet Chew 2 tablets by mouth daily.   07/24/2021  ? diphenhydrAMINE (BENADRYL) 25 MG tablet Take 50 mg by mouth every 6 (six) hours as needed for itching.   07/24/2021  ? Prenat w/o A Vit-FeFum-FePo-FA (CONCEPT OB) 130-92.4-1 MG CAPS Take 1 capsule by mouth daily. 30 capsule 11 07/24/2021  ? ?Allergies: is allergic to shrimp [shellfish allergy]. ?OBHx:  ?OB History  ?Gravida Para Term Preterm AB Living  ?1            ?SAB IAB Ectopic Multiple Live Births  ?           ?  ?# Outcome Date GA Lbr  Len/2nd Weight Sex Delivery Anes PTL Lv  ?1 Current           ? ?TTC:[id number] except as detailed in HPI.Marland Kitchen  No family history of birth defects. ?Soc Hx: Alcohol: none and Recreational drug use: none ? ?Objective:  ? ?Vitals:  ? 07/25/21 1416 07/25/21 1431  ?BP: (!) 153/101 (!) 151/97  ?Pulse: 79 85  ? ?Constitutional: Well nourished, well developed female in no acute distress.  ?HEENT: normal ?Skin: Warm and dry.  ?Cardiovascular:Regular rate and rhythm.   ?Extremity: trace to 1+ bilateral pedal edema ?Respiratory: Clear to auscultation bilateral. Normal respiratory effort ?Abdomen: gravid, ND, FHT present, no tenderness on exam ?Back: no CVAT ?Neuro: DTRs 2+, Cranial nerves grossly intact ?Psych: Alert and Oriented x3. No memory deficits. Normal mood and affect.  ?MS: normal gait, normal bilateral lower extremity ROM/strength/stability. ? ?Pelvic exam: not done (Placenta previa) ? ?EFM:FHR: 140 bpm, variability: moderate,  accelerations:  Present,  decelerations:  Absent ?Toco: None ? ? ?Perinatal info:  ?Blood type: B positive ?Rubella- Not immune ?Varicella -Not immune ?TDaP Given during third trimester of this pregnancy ?RPR NR / HIV Neg/ HBsAg Neg  ? ?Results for orders placed or performed during the hospital encounter of 07/25/21  ?Protein / creatinine ratio, urine  ?Result Value Ref Range  ?  Creatinine, Urine 79 mg/dL  ? Total Protein, Urine 19 mg/dL  ? Protein Creatinine Ratio 0.24 (H) 0.00 - 0.15 mg/mg[Cre]  ?CBC  ?Result Value Ref Range  ? WBC 10.8 (H) 4.0 - 10.5 K/uL  ? RBC 4.62 3.87 - 5.11 MIL/uL  ? Hemoglobin 12.5 12.0 - 15.0 g/dL  ? HCT 36.3 36.0 - 46.0 %  ? MCV 78.6 (L) 80.0 - 100.0 fL  ? MCH 27.1 26.0 - 34.0 pg  ? MCHC 34.4 30.0 - 36.0 g/dL  ? RDW 13.5 11.5 - 15.5 %  ? Platelets 140 (L) 150 - 400 K/uL  ? nRBC 0.0 0.0 - 0.2 %  ?Comprehensive metabolic panel  ?Result Value Ref Range  ? Sodium 134 (L) 135 - 145 mmol/L  ? Potassium 3.6 3.5 - 5.1 mmol/L  ? Chloride 106 98 - 111 mmol/L  ? CO2  21 (L) 22 - 32 mmol/L  ? Glucose, Bld 100 (H) 70 - 99 mg/dL  ? BUN 6 6 - 20 mg/dL  ? Creatinine, Ser 0.72 0.44 - 1.00 mg/dL  ? Calcium 8.6 (L) 8.9 - 10.3 mg/dL  ? Total Protein 6.4 (L) 6.5 - 8.1 g/dL  ? Albumin 2.9 (L) 3.5 - 5.0 g/dL  ? AST 25 15 - 41 U/L  ? ALT 18 0 - 44 U/L  ? Alkaline Phosphatase 196 (H) 38 - 126 U/L  ? Total Bilirubin 0.6 0.3 - 1.2 mg/dL  ? GFR, Estimated >60 >60 mL/min  ? Anion gap 7 5 - 15  ?Type and screen [facility]  ?Result Value Ref Range  ? ABO/RH(D) B POS   ? Antibody Screen NEG   ? Sample Expiration    ?  07/28/2021,2359 ?Performed at [facility], [address], [place], [place] [address] ?  ? ? ?Assessment & Plan:  ? ?26 y.o. G1P0 @ [redacted]w[redacted]d, Admitted on 07/25/2021:evaluation for preeclampsia has she has elevated blood pressures.  She has headache, but does have a history of migraines. ?   ?BMZ ?Labs not c/w preeclampsia ?Will see how responds to Labetalol po dose ?Still plan for CS next week, sooner if bleeding or if continues with poorly responsive hypertension ? ?Annamarie Major, MD, FACOG ?[facility], [facility] ?07/25/2021  2:45 PM ? ?

## 2021-07-25 NOTE — OB Triage Note (Signed)
Patient is a [redacted]w[redacted]d G1P0 sent over from the office for a PIH evaluation. Patient states that she has a headache that she rates a 4/10 on a 0-10 pain scale. Patient does state that she has a history of migraines. No medications taken for headache prior to visit. Patient complains of swelling in hands and having a "tingling sensation", she rates that pain a 7/10 on a 0-10 pain scale. No leaking of fluid, no bleeding. Patient feels baby movement. Patient states that there are no contractions. No complaints of RUQ pain and no visual disturbances. Monitors applied and assessing. Initial BP 147/107.  ?

## 2021-07-25 NOTE — Progress Notes (Signed)
Advised patient of 24 hour urine, to notify RN when she goes to the restroom to collect all urines for the next 24 hours.  ?

## 2021-07-26 DIAGNOSIS — O4403 Placenta previa specified as without hemorrhage, third trimester: Secondary | ICD-10-CM | POA: Diagnosis not present

## 2021-07-26 DIAGNOSIS — O163 Unspecified maternal hypertension, third trimester: Secondary | ICD-10-CM | POA: Diagnosis not present

## 2021-07-26 DIAGNOSIS — Z3A35 35 weeks gestation of pregnancy: Secondary | ICD-10-CM | POA: Diagnosis not present

## 2021-07-26 LAB — CERVICOVAGINAL ANCILLARY ONLY
Chlamydia: NEGATIVE
Comment: NEGATIVE
Comment: NORMAL
Neisseria Gonorrhea: NEGATIVE

## 2021-07-26 MED ORDER — LABETALOL HCL 200 MG PO TABS: 200.0000 mg | ORAL_TABLET | Freq: Two times a day (BID) | ORAL | 2 refills | Status: DC

## 2021-07-26 NOTE — Discharge Summary (Signed)
Physician Final Progress Note ? ?Patient ID: ?Kari Morales ?MRN: [id number] ?DOB/AGE: 15-Feb-1996 26 y.o. ? ?Admit date: 07/25/2021 ?Admitting provider: Gae Dry, MD ?Discharge date: 07/26/2021 ? ? ?Admission Diagnoses:  ?1) intrauterine pregnancy at [redacted]w[redacted]d  ?2) elevated blood pressure in third trimester ? ?Discharge Diagnoses:  ?Principal Problem: ?  Hypertension during pregnancy in third trimester, unspecified hypertension in pregnancy type ?Active Problems: ?  [redacted] weeks gestation of pregnancy ? Placenta previa ? ?History of Present Illness: The patient is a 26 y.o. female G1P0 at [redacted]w[redacted]d who presents for headaches and elevated blood pressures noted on office exam yesterday.  Pt has h/o migraine headaches. She denies blurry vision, CP, SOB, epigastric pain, contractions, bleeding, ROM.  She has placenta previa, planning delivery by cesarean next week.  ? ?She was admitted for course of betamethasone and also collection of 24 hour urine to evaluate for preeclampsia. She was started on labetalol yesterday with good response. She desires discharge to home so she can attend her baby shower tomorrow. She is discharged with Rx labetalol 200 mg BID and careful review of instructions and precautions/warning signs to return for. She has home blood pressure cuff and her husband has already picked up her prescription. ? ?Past Medical History:  ?Diagnosis Date  ? Hypertension   ? history  ? Migraine   ? PCOS (polycystic ovarian syndrome)   ? ? ?Past Surgical History:  ?Procedure Laterality Date  ? DENTAL SURGERY    ? ganglian cyst removal in left hand    ? ~2007 or 2008 per pt  ? GANGLION CYST EXCISION Left   ? x2  ? ? ?No current facility-administered medications on file prior to encounter.  ? ?Current Outpatient Medications on File Prior to Encounter  ?Medication Sig Dispense Refill  ? acetaminophen (TYLENOL) 500 MG tablet Take 500 mg by mouth every 6 (six) hours as needed.    ? calcium carbonate (TUMS EX) 750 MG chewable  tablet Chew 2 tablets by mouth daily.    ? diphenhydrAMINE (BENADRYL) 25 MG tablet Take 50 mg by mouth every 6 (six) hours as needed for itching.    ? Prenat w/o A Vit-FeFum-FePo-FA (CONCEPT OB) 130-92.4-1 MG CAPS Take 1 capsule by mouth daily. 30 capsule 11  ? ? ?Allergies  ?Allergen Reactions  ? Shrimp [Shellfish Allergy] Anaphylaxis  ? ? ?Social History  ? ?Socioeconomic History  ? Marital status: Significant Other  ?  Spouse name: Raheen  ? Number of children: Not on file  ? Years of education: Not on file  ? Highest education level: Not on file  ?Occupational History  ? Not on file  ?Tobacco Use  ? Smoking status: Former  ?  Types: E-cigarettes, Cigars  ?  Quit date: 09/19/2020  ?  Years since quitting: 0.8  ? Smokeless tobacco: Never  ?Vaping Use  ? Vaping Use: Former  ? Start date: 09/03/2019  ?Substance and Sexual Activity  ? Alcohol use: Not Currently  ?  Comment: occassionally  ? Drug use: Never  ? Sexual activity: Yes  ?  Birth control/protection: None  ?Other Topics Concern  ? Not on file  ?Social History Narrative  ? Not on file  ? ?Social Determinants of Health  ? ?Financial Resource Strain: Not on file  ?Food Insecurity: Not on file  ?Transportation Needs: Not on file  ?Physical Activity: Not on file  ?Stress: Not on file  ?Social Connections: Not on file  ?Intimate Partner Violence: Not on file  ? ? ?  Family History  ?Problem Relation Age of Onset  ? Hypertension Mother   ? Cancer Paternal Grandfather   ? Prostate cancer Paternal Grandfather   ? Breast cancer Half-Sister   ?  ? ?Review of Systems  ?Constitutional:  Negative for chills and fever.  ?HENT:  Negative for congestion, ear discharge, ear pain, hearing loss, sinus pain and sore throat.   ?Eyes:  Negative for blurred vision and double vision.  ?Respiratory:  Negative for cough, shortness of breath and wheezing.   ?Cardiovascular:  Negative for chest pain, palpitations and leg swelling.  ?Gastrointestinal:  Negative for abdominal pain, blood in  stool, constipation, diarrhea, heartburn, melena, nausea and vomiting.  ?Genitourinary:  Negative for dysuria, flank pain, frequency, hematuria and urgency.  ?Musculoskeletal:  Negative for back pain, joint pain and myalgias.  ?Skin:  Negative for itching and rash.  ?Neurological:  Negative for dizziness, tingling, tremors, sensory change, speech change, focal weakness, seizures, loss of consciousness, weakness and headaches.  ?Endo/Heme/Allergies:  Negative for environmental allergies. Does not bruise/bleed easily.  ?Psychiatric/Behavioral:  Negative for depression, hallucinations, memory loss, substance abuse and suicidal ideas. The patient is not nervous/anxious and does not have insomnia.    ? ?Physical Exam: ?BP (!) 142/90 (BP Location: Left Arm)   Pulse 92   Temp 99.1 ?F (37.3 ?C) (Oral)   Resp 20   Ht 5\' 4"  (1.626 m)   Wt 94 kg   LMP 11/17/2020 (Exact Date) Comment: G1P0  SpO2 100%   BMI 35.57 kg/m?   ?Constitutional: Well nourished, well developed female in no acute distress.  ?HEENT: normal ?Skin: Warm and dry.  ?Cardiovascular: Regular rate and rhythm.   ?Extremity:  no edema   ?Respiratory: Clear to auscultation bilateral. Normal respiratory effort ?Abdomen: FHT present ?Neuro: DTRs 2+, Cranial nerves grossly intact ?Psych: Alert and Oriented x3. No memory deficits. Normal mood and affect.  ? ?NST: reactive tracing, 145 bpm, moderate variability, +accelerations, negative decelerations ?Toco: negative ? ?Consults: None ? ?Significant Findings/ Diagnostic Studies: labs:  ? Latest Reference Range & Units 07/25/21 13:12 07/25/21 13:16  ?COMPREHENSIVE METABOLIC PANEL  Rpt !   ?Sodium 135 - 145 mmol/L 134 (L)   ?Potassium 3.5 - 5.1 mmol/L 3.6   ?Chloride 98 - 111 mmol/L 106   ?CO2 22 - 32 mmol/L 21 (L)   ?Glucose 70 - 99 mg/dL 100 (H)   ?BUN 6 - 20 mg/dL 6   ?Creatinine 0.44 - 1.00 mg/dL 0.72   ?Calcium 8.9 - 10.3 mg/dL 8.6 (L)   ?Anion gap 5 - 15  7   ?Alkaline Phosphatase 38 - 126 U/L 196 (H)    ?Albumin 3.5 - 5.0 g/dL 2.9 (L)   ?AST 15 - 41 U/L 25   ?ALT 0 - 44 U/L 18   ?Total Protein 6.5 - 8.1 g/dL 6.4 (L)   ?Total Bilirubin 0.3 - 1.2 mg/dL 0.6   ?GFR, Estimated >60 mL/min >60   ?WBC 4.0 - 10.5 K/uL 10.8 (H)   ?RBC 3.87 - 5.11 MIL/uL 4.62   ?Hemoglobin 12.0 - 15.0 g/dL 12.5   ?HCT 36.0 - 46.0 % 36.3   ?MCV 80.0 - 100.0 fL 78.6 (L)   ?MCH 26.0 - 34.0 pg 27.1   ?MCHC 30.0 - 36.0 g/dL 34.4   ?RDW 11.5 - 15.5 % 13.5   ?Platelets 150 - 400 K/uL 140 (L)   ?nRBC 0.0 - 0.2 % 0.0   ?Sample Expiration  07/28/2021,2359 ?Performed at [facility], [address] [place],  [address] ?   ?Antibody Screen  NEG   ?ABO/RH(D)  B POS   ?Total Protein, Urine mg/dL  19  ?Protein Creatinine Ratio 0.00 - 0.15 mg/mgCre  0.24 (H)  ?Creatinine, Urine mg/dL  79  ?!: Data is abnormal ?(L): Data is abnormally low ?(H): Data is abnormally high ?Rpt: View report in Results Review for more information ? ?24 hour urine protein in process ? ?Procedures: NST ? ?Hospital Course: The patient was admitted to Antepartum for observation.  ? ?Discharge Condition: good ? ?Disposition: Discharge disposition: 01-Home or Self Care ? ?Diet: Regular diet ? ?Discharge Activity: Activity as tolerated ? ?Discharge Instructions   ? ? Discharge activity:   Complete by: As directed ?  ? Activity as tolerated, pelvic rest  ? Discharge diet:  No restrictions   Complete by: As directed ?  ? Notify physician for a general feeling that "something is not right"   Complete by: As directed ?  ? Notify physician for increase or change in vaginal discharge   Complete by: As directed ?  ? Notify physician for intestinal cramps, with or without diarrhea, sometimes described as "gas pain"   Complete by: As directed ?  ? Notify physician for leaking of fluid   Complete by: As directed ?  ? Notify physician for low, dull backache, unrelieved by heat or Tylenol   Complete by: As directed ?  ? Notify physician for menstrual like cramps   Complete by: As  directed ?  ? Notify physician for pelvic pressure   Complete by: As directed ?  ? Notify physician for uterine contractions.  These may be painless and feel like the uterus is tightening or the baby is  "balling up"   Com

## 2021-07-26 NOTE — Progress Notes (Signed)
Patient discharged, discharge instructions and follow up appointment given to patient.  Patient transported by nursing staff. ?

## 2021-07-26 NOTE — Progress Notes (Signed)
Daily Antepartum Note  ?Admission Date: 07/25/2021 ?Current Date: 07/26/2021 ?12:12 PM ? ?Kari Morales is a 26 y.o. G1P0 @ [redacted]w[redacted]d by LMP c/w 7 week, HD#1, admitted for [facility] evaluation. ? ?Pregnancy complicated by: ? ?Patient Active Problem List  ? Diagnosis Date Noted  ? Hypertension during pregnancy in third trimester, unspecified hypertension in pregnancy type 07/25/2021  ? Placenta previa 03/06/2021  ? Supervision of high risk pregnancy, antepartum 01/17/2021  ? UTI symptoms 09/13/2020  ? Migraine headache 12/25/2017  ? ? ?Overnight/24hr events:  ?none ? ?Subjective:  ?She is doing well and reports good fetal movement. She denies vaginal bleeding, leakage of fluid or contractions. She denies headache, visual changes or epigastric pain.She would like to discharge to home later today. ? ?Objective:  ? ?Vitals:  ? 07/26/21 0602 07/26/21 0732  ?BP: 125/82 130/83  ?Pulse:  86  ?Resp:  20  ?Temp:  98.1 ?F (36.7 ?C)  ?SpO2:  100%  ? ?Temp:  [97.8 ?F (36.6 ?C)-98.3 ?F (36.8 ?C)] 98.1 ?F (36.7 ?C) (04/07 0732) ?Pulse Rate:  [67-97] 86 (04/07 0732) ?Resp:  [16-20] 20 (04/07 0732) ?BP: (122-157)/(73-103) 130/83 (04/07 0732) ?SpO2:  [99 %-100 %] 100 % (04/07 0732) ?Temp (24hrs), Avg:98.1 ?F (36.7 ?C), Min:97.8 ?F (36.6 ?C), Max:98.3 ?F (36.8 ?C) ? ? ?Intake/Output Summary (Last 24 hours) at 07/26/2021 1212 ?Last data filed at 07/26/2021 1130 ?Gross per 24 hour  ?Intake 240 ml  ?Output 1200 ml  ?Net -960 ml  ? ? ? Current Vital Signs 24h Vital Sign Ranges  ?T 98.1 ?F (36.7 ?C) Temp  Avg: 98.1 ?F (36.7 ?C)  Min: 97.8 ?F (36.6 ?C)  Max: 98.3 ?F (36.8 ?C)  ?BP 130/83 BP  Min: 122/78  Max: 157/99  ?HR 86 Pulse  Avg: 80.4  Min: 67  Max: 97  ?RR 20 Resp  Avg: 17.3  Min: 16  Max: 20  ?SaO2 100 % Room Air SpO2  Avg: 99.5 %  Min: 99 %  Max: 100 %  ?    ? 24 Hour I/O Current Shift I/O  ?Time ?Ins ?Outs No intake/output data recorded. 04/07 0701 - 04/07 1900 ?In: 240 [P.O.:240] ?Out: 1200 [Urine:1200]  ? ?Patient Vitals for the past 24 hrs: ? BP  Temp Temp src Pulse Resp SpO2  ?07/26/21 0732 130/83 98.1 ?F (36.7 ?C) Oral 86 20 100 %  ?07/26/21 0602 125/82 -- -- -- -- --  ?07/26/21 0357 130/85 98.3 ?F (36.8 ?C) Oral 86 -- 99 %  ?07/26/21 0157 122/78 -- -- 80 -- --  ?07/26/21 0000 125/76 97.8 ?F (36.6 ?C) Oral 78 16 --  ?07/25/21 2201 129/87 -- -- 82 -- --  ?07/25/21 2101 (!) 142/88 -- -- 81 -- --  ?07/25/21 2040 (!) 142/89 -- -- 75 -- --  ?07/25/21 2001 (!) 139/95 -- -- 78 -- --  ?07/25/21 1900 (!) 138/94 -- -- 91 16 --  ?07/25/21 1831 133/90 -- -- 79 -- --  ?07/25/21 1746 (!) 141/88 -- -- 69 -- --  ?07/25/21 1731 139/88 -- -- 78 -- --  ?07/25/21 1716 138/90 -- -- 75 -- --  ?07/25/21 1701 136/88 -- -- 73 -- --  ?07/25/21 1646 133/87 -- -- 79 -- --  ?07/25/21 1631 130/73 -- -- 97 -- --  ?07/25/21 1616 134/83 -- -- 78 -- --  ?07/25/21 1601 136/87 -- -- 75 -- --  ?07/25/21 1546 (!) 142/92 -- -- 78 -- --  ?07/25/21 1531 (!) 147/92 -- -- 75 -- --  ?  07/25/21 1516 (!) 141/96 -- -- 77 -- --  ?07/25/21 1509 (!) 145/96 -- -- 67 -- --  ?07/25/21 1446 (!) 152/99 -- -- 78 -- --  ?07/25/21 1431 (!) 151/97 -- -- 85 -- --  ?07/25/21 1416 (!) 153/101 -- -- 79 -- --  ?07/25/21 1401 (!) 147/99 -- -- 74 -- --  ?07/25/21 1346 (!) 146/103 -- -- 78 -- --  ?07/25/21 1331 (!) 138/98 -- -- 84 -- --  ?07/25/21 1316 (!) 157/99 -- -- 80 -- --  ?07/25/21 1246 (!) 142/100 -- -- 91 -- --  ?07/25/21 1231 (!) 149/99 -- -- 97 -- --  ?07/25/21 1215 (!) 152/101 -- -- 91 -- --  ? ? ?Fetal Heart Tones: 140 bpm, moderate variability, +accelerations, -decelerations ? ?Tocometry: negative for contractions ? ?Physical exam: ?General: Well nourished, well developed female in no acute distress. ?Abdomen: gravid  ?Cardiovascular: regular rate and rhythm ?Respiratory: CTAB ?Extremities: no clubbing, cyanosis or edema ?Skin: Warm and dry.  ? ?Medications: ?Current Facility-Administered Medications  ?Medication Dose Route Frequency Provider Last Rate Last Admin  ? acetaminophen (TYLENOL) tablet 650 mg  650  mg Oral Q4H PRN Nadara Mustard, MD   650 mg at 07/25/21 1237  ? betamethasone acetate-betamethasone sodium phosphate (CELESTONE) injection 12 mg  12 mg Intramuscular Q24 Hr x 2 Nadara Mustard, MD   12 mg at 07/25/21 1238  ? labetalol (NORMODYNE) tablet 200 mg  200 mg Oral BID Nadara Mustard, MD   200 mg at 07/26/21 0932  ? lidocaine (PF) (XYLOCAINE) 1 % injection 30 mL  30 mL Subcutaneous PRN Nadara Mustard, MD      ? ondansetron [facility]) injection 4 mg  4 mg Intravenous Q6H PRN Nadara Mustard, MD      ? ? ?Labs:  ?Recent Labs  ?Lab 07/25/21 ?1312  ?WBC 10.8*  ?HGB 12.5  ?HCT 36.3  ?PLT 140*  ? ? ?Recent Labs  ?Lab 07/25/21 ?1312  ?NA 134*  ?K 3.6  ?CL 106  ?CO2 21*  ?BUN 6  ?CREATININE 0.72  ?CALCIUM 8.6*  ?PROT 6.4*  ?BILITOT 0.6  ?ALKPHOS 196*  ?ALT 18  ?AST 25  ?GLUCOSE 100*  ? ? ? ?Assessment & Plan:  ? ?26 y.o. G1 P0 female with IUP at [redacted]w[redacted]d, placenta previa, new diagnosis elevated blood pressure on Labetalol ? ?Continue 24 hour urine collection ?Continue Labetalol ?2nd dose of BMZ at 1 PM today ?Regular diet ?Discharge to home after urine collection ?C/S on 4/13/pre op visit on 4/11 ? ? ?Parke Poisson, CNM ?[facility] ?[facility] ?07/26/2021, 12:15 PM ? ?

## 2021-07-26 NOTE — Progress Notes (Signed)
2nd betamethasone shot given at 1300.  ?

## 2021-07-27 LAB — PROTEIN, URINE, 24 HOUR
Collection Interval-UPROT: 24 hours
Protein, Urine: 8 mg/dL
Urine Total Volume-UPROT: 3600 mL

## 2021-07-28 ENCOUNTER — Encounter: Payer: Self-pay | Admitting: Obstetrics and Gynecology

## 2021-07-28 LAB — CULTURE, BETA STREP (GROUP B ONLY): Strep Gp B Culture: POSITIVE — AB

## 2021-07-30 ENCOUNTER — Inpatient Hospital Stay
Admission: EM | Admit: 2021-07-30 | Discharge: 2021-08-02 | DRG: 787 | Disposition: A | Discharge status: Home / Self Care | Payer: Medicaid Other | Attending: Obstetrics and Gynecology | Admitting: Obstetrics and Gynecology

## 2021-07-30 ENCOUNTER — Inpatient Hospital Stay: Payer: Medicaid Other | Admitting: Anesthesiology

## 2021-07-30 ENCOUNTER — Other Ambulatory Visit: Admission: RE | Admit: 2021-07-30 | Payer: Medicaid Other | Source: Ambulatory Visit

## 2021-07-30 ENCOUNTER — Encounter
Admission: EM | Disposition: A | Discharge status: Home / Self Care | Payer: Self-pay | Source: Home / Self Care | Attending: Obstetrics and Gynecology

## 2021-07-30 ENCOUNTER — Encounter: Payer: Self-pay | Admitting: Obstetrics and Gynecology

## 2021-07-30 ENCOUNTER — Other Ambulatory Visit: Payer: Self-pay

## 2021-07-30 DIAGNOSIS — O0990 Supervision of high risk pregnancy, unspecified, unspecified trimester: Secondary | ICD-10-CM

## 2021-07-30 DIAGNOSIS — O34211 Maternal care for low transverse scar from previous cesarean delivery: Secondary | ICD-10-CM | POA: Diagnosis not present

## 2021-07-30 DIAGNOSIS — Z3A36 36 weeks gestation of pregnancy: Secondary | ICD-10-CM

## 2021-07-30 DIAGNOSIS — D62 Acute posthemorrhagic anemia: Secondary | ICD-10-CM | POA: Diagnosis not present

## 2021-07-30 DIAGNOSIS — O164 Unspecified maternal hypertension, complicating childbirth: Secondary | ICD-10-CM | POA: Diagnosis not present

## 2021-07-30 DIAGNOSIS — O163 Unspecified maternal hypertension, third trimester: Secondary | ICD-10-CM | POA: Diagnosis present

## 2021-07-30 DIAGNOSIS — Z87891 Personal history of nicotine dependence: Secondary | ICD-10-CM | POA: Diagnosis not present

## 2021-07-30 DIAGNOSIS — D573 Sickle-cell trait: Secondary | ICD-10-CM | POA: Diagnosis present

## 2021-07-30 DIAGNOSIS — B951 Streptococcus, group B, as the cause of diseases classified elsewhere: Secondary | ICD-10-CM | POA: Diagnosis not present

## 2021-07-30 DIAGNOSIS — O4403 Placenta previa specified as without hemorrhage, third trimester: Secondary | ICD-10-CM | POA: Diagnosis not present

## 2021-07-30 DIAGNOSIS — O9081 Anemia of the puerperium: Secondary | ICD-10-CM | POA: Diagnosis not present

## 2021-07-30 DIAGNOSIS — O99824 Streptococcus B carrier state complicating childbirth: Secondary | ICD-10-CM | POA: Diagnosis present

## 2021-07-30 DIAGNOSIS — O1414 Severe pre-eclampsia complicating childbirth: Principal | ICD-10-CM | POA: Diagnosis present

## 2021-07-30 DIAGNOSIS — O9882 Other maternal infectious and parasitic diseases complicating childbirth: Secondary | ICD-10-CM

## 2021-07-30 DIAGNOSIS — Z3A35 35 weeks gestation of pregnancy: Secondary | ICD-10-CM

## 2021-07-30 DIAGNOSIS — R030 Elevated blood-pressure reading, without diagnosis of hypertension: Secondary | ICD-10-CM | POA: Diagnosis not present

## 2021-07-30 DIAGNOSIS — I10 Essential (primary) hypertension: Secondary | ICD-10-CM | POA: Diagnosis not present

## 2021-07-30 LAB — CBC WITH DIFFERENTIAL/PLATELET
Abs Immature Granulocytes: 0.19 10*3/uL — ABNORMAL HIGH (ref 0.00–0.07)
Abs Immature Granulocytes: 0.37 10*3/uL — ABNORMAL HIGH (ref 0.00–0.07)
Basophils Absolute: 0 10*3/uL (ref 0.0–0.1)
Basophils Absolute: 0.1 10*3/uL (ref 0.0–0.1)
Basophils Relative: 0 %
Basophils Relative: 0 %
Eosinophils Absolute: 0.2 10*3/uL (ref 0.0–0.5)
Eosinophils Absolute: 0 10*3/uL (ref 0.0–0.5)
Eosinophils Relative: 1 %
Eosinophils Relative: 0 %
HCT: 34.5 % — ABNORMAL LOW (ref 36.0–46.0)
HCT: 33.7 % — ABNORMAL LOW (ref 36.0–46.0)
Hemoglobin: 11.6 g/dL — ABNORMAL LOW (ref 12.0–15.0)
Hemoglobin: 11.3 g/dL — ABNORMAL LOW (ref 12.0–15.0)
Immature Granulocytes: 1 %
Immature Granulocytes: 2 %
Lymphocytes Relative: 15 %
Lymphocytes Relative: 6 %
Lymphs Abs: 2.1 10*3/uL (ref 0.7–4.0)
Lymphs Abs: 1.5 10*3/uL (ref 0.7–4.0)
MCH: 26.9 pg (ref 26.0–34.0)
MCH: 26.9 pg (ref 26.0–34.0)
MCHC: 33.6 g/dL (ref 30.0–36.0)
MCHC: 33.5 g/dL (ref 30.0–36.0)
MCV: 79.9 fL — ABNORMAL LOW (ref 80.0–100.0)
MCV: 80.2 fL (ref 80.0–100.0)
Monocytes Absolute: 1.9 10*3/uL — ABNORMAL HIGH (ref 0.1–1.0)
Monocytes Absolute: 1.2 10*3/uL — ABNORMAL HIGH (ref 0.1–1.0)
Monocytes Relative: 13 %
Monocytes Relative: 5 %
Neutro Abs: 9.8 10*3/uL — ABNORMAL HIGH (ref 1.7–7.7)
Neutro Abs: 21.6 10*3/uL — ABNORMAL HIGH (ref 1.7–7.7)
Neutrophils Relative %: 70 %
Neutrophils Relative %: 87 %
Platelets: 146 10*3/uL — ABNORMAL LOW (ref 150–400)
Platelets: 174 10*3/uL (ref 150–400)
RBC: 4.32 MIL/uL (ref 3.87–5.11)
RBC: 4.2 MIL/uL (ref 3.87–5.11)
RDW: 13.4 % (ref 11.5–15.5)
RDW: 13.5 % (ref 11.5–15.5)
WBC: 14.1 10*3/uL — ABNORMAL HIGH (ref 4.0–10.5)
WBC: 24.7 10*3/uL — ABNORMAL HIGH (ref 4.0–10.5)
nRBC: 0.4 % — ABNORMAL HIGH (ref 0.0–0.2)
nRBC: 0.2 % (ref 0.0–0.2)

## 2021-07-30 LAB — RAPID HIV SCREEN (HIV 1/2 AB+AG)
HIV 1/2 Antibodies: NONREACTIVE
HIV-1 P24 Antigen - HIV24: NONREACTIVE

## 2021-07-30 LAB — COMPREHENSIVE METABOLIC PANEL
ALT: 82 U/L — ABNORMAL HIGH (ref 0–44)
AST: 80 U/L — ABNORMAL HIGH (ref 15–41)
Albumin: 2.8 g/dL — ABNORMAL LOW (ref 3.5–5.0)
Alkaline Phosphatase: 182 U/L — ABNORMAL HIGH (ref 38–126)
Anion gap: 8 (ref 5–15)
BUN: 6 mg/dL (ref 6–20)
CO2: 22 mmol/L (ref 22–32)
Calcium: 8.3 mg/dL — ABNORMAL LOW (ref 8.9–10.3)
Chloride: 105 mmol/L (ref 98–111)
Creatinine, Ser: 0.68 mg/dL (ref 0.44–1.00)
GFR, Estimated: >60 mL/min (ref >60)
Glucose, Bld: 77 mg/dL (ref 70–99)
Potassium: 3 mmol/L — ABNORMAL LOW (ref 3.5–5.1)
Sodium: 135 mmol/L (ref 135–145)
Total Bilirubin: 0.8 mg/dL (ref 0.3–1.2)
Total Protein: 6 g/dL — ABNORMAL LOW (ref 6.5–8.1)

## 2021-07-30 LAB — PROTEIN / CREATININE RATIO, URINE
Creatinine, Urine: 47 mg/dL
Protein Creatinine Ratio: 0.62 mg/mg{Cre} — ABNORMAL HIGH (ref 0.00–0.15)
Total Protein, Urine: 29 mg/dL

## 2021-07-30 LAB — PREPARE RBC (CROSSMATCH)

## 2021-07-30 SURGERY — Surgical Case
Anesthesia: Spinal

## 2021-07-30 MED ORDER — FERROUS SULFATE 325 (65 FE) MG PO TABS
325.0000 mg | ORAL_TABLET | Freq: Two times a day (BID) | ORAL | Status: DC
  Administered 2021-07-30 – 2021-08-02 (×6): 325 mg via ORAL
  Filled 2021-07-30 (×6): qty 1

## 2021-07-30 MED ORDER — DIPHENOXYLATE-ATROPINE 2.5-0.025 MG PO TABS
ORAL_TABLET | ORAL | Status: AC
  Filled 2021-07-30: qty 1

## 2021-07-30 MED ORDER — LACTATED RINGERS IV SOLN: 500.0000 mL | INTRAVENOUS | Status: DC | PRN

## 2021-07-30 MED ORDER — CHLORHEXIDINE GLUCONATE 0.12 % MT SOLN
15.0000 mL | Freq: Once | OROMUCOSAL | Status: AC
  Administered 2021-07-30: 15 mL via OROMUCOSAL
  Filled 2021-07-30: qty 15

## 2021-07-30 MED ORDER — MORPHINE SULFATE (PF) 0.5 MG/ML IJ SOLN
INTRAMUSCULAR | Status: DC | PRN
  Administered 2021-07-30: .1 mg via INTRATHECAL

## 2021-07-30 MED ORDER — CONCEPT OB 130-92.4-1 MG PO CAPS: 1.0000 | ORAL_CAPSULE | Freq: Every day | ORAL | Status: DC

## 2021-07-30 MED ORDER — FENTANYL CITRATE (PF) 100 MCG/2ML IJ SOLN
INTRAMUSCULAR | Status: AC
  Filled 2021-07-30: qty 2

## 2021-07-30 MED ORDER — DIPHENOXYLATE-ATROPINE 2.5-0.025 MG PO TABS
1.0000 | ORAL_TABLET | Freq: Once | ORAL | Status: AC
  Administered 2021-07-30: 1 via ORAL

## 2021-07-30 MED ORDER — BUTORPHANOL TARTRATE 1 MG/ML IJ SOLN: 1.0000 mg | INTRAMUSCULAR | Status: DC | PRN

## 2021-07-30 MED ORDER — SODIUM CHLORIDE 0.9% IV SOLUTION: Freq: Once | INTRAVENOUS | Status: DC

## 2021-07-30 MED ORDER — MAGNESIUM SULFATE 40 GM/1000ML IV SOLN
2.0000 g/h | INTRAVENOUS | Status: DC
  Administered 2021-07-31: 2 g/h via INTRAVENOUS
  Filled 2021-07-30 (×2): qty 1000

## 2021-07-30 MED ORDER — BUPIVACAINE HCL (PF) 0.5 % IJ SOLN
INTRAMUSCULAR | Status: AC
  Filled 2021-07-30: qty 10

## 2021-07-30 MED ORDER — MAGNESIUM SULFATE BOLUS VIA INFUSION
6.0000 g | Freq: Once | INTRAVENOUS | Status: DC
  Filled 2021-07-30: qty 1000

## 2021-07-30 MED ORDER — OXYTOCIN-SODIUM CHLORIDE 30-0.9 UT/500ML-% IV SOLN
2.5000 [IU]/h | INTRAVENOUS | Status: DC
  Administered 2021-07-30: 2.5 [IU]/h via INTRAVENOUS

## 2021-07-30 MED ORDER — HYDRALAZINE HCL 20 MG/ML IJ SOLN
10.0000 mg | INTRAMUSCULAR | Status: DC | PRN
  Administered 2021-07-30: 10 mg via INTRAVENOUS
  Filled 2021-07-30: qty 1

## 2021-07-30 MED ORDER — SOD CITRATE-CITRIC ACID 500-334 MG/5ML PO SOLN: 30.0000 mL | ORAL | Status: AC

## 2021-07-30 MED ORDER — IBUPROFEN 600 MG PO TABS
600.0000 mg | ORAL_TABLET | Freq: Four times a day (QID) | ORAL | Status: DC
  Administered 2021-07-31 – 2021-08-02 (×8): 600 mg via ORAL
  Filled 2021-07-30 (×8): qty 1

## 2021-07-30 MED ORDER — MENTHOL 3 MG MT LOZG
1.0000 | LOZENGE | OROMUCOSAL | Status: DC | PRN
  Filled 2021-07-30: qty 9

## 2021-07-30 MED ORDER — LACTATED RINGERS IV SOLN: Freq: Once | INTRAVENOUS | Status: AC

## 2021-07-30 MED ORDER — OXYTOCIN BOLUS FROM INFUSION
333.0000 mL | Freq: Once | INTRAVENOUS | Status: DC
  Administered 2021-07-30: 333 mL via INTRAVENOUS

## 2021-07-30 MED ORDER — LIDOCAINE HCL (PF) 1 % IJ SOLN: 30.0000 mL | INTRAMUSCULAR | Status: DC | PRN

## 2021-07-30 MED ORDER — BISACODYL 10 MG RE SUPP
10.0000 mg | Freq: Every day | RECTAL | Status: DC | PRN
  Filled 2021-07-30: qty 1

## 2021-07-30 MED ORDER — BUPIVACAINE IN DEXTROSE 0.75-8.25 % IT SOLN
INTRATHECAL | Status: DC | PRN
Start: 2021-07-30 — End: 2021-07-30
  Administered 2021-07-30: 1.5 mL via INTRATHECAL

## 2021-07-30 MED ORDER — CEFAZOLIN SODIUM-DEXTROSE 2-4 GM/100ML-% IV SOLN
2.0000 g | INTRAVENOUS | Status: AC
  Administered 2021-07-30: 2 g via INTRAVENOUS

## 2021-07-30 MED ORDER — CARBOPROST TROMETHAMINE 250 MCG/ML IM SOLN
INTRAMUSCULAR | Status: AC
  Filled 2021-07-30: qty 1

## 2021-07-30 MED ORDER — KETOROLAC TROMETHAMINE 30 MG/ML IJ SOLN: 30.0000 mg | Freq: Four times a day (QID) | INTRAMUSCULAR | Status: DC | PRN

## 2021-07-30 MED ORDER — SIMETHICONE 80 MG PO CHEW
80.0000 mg | CHEWABLE_TABLET | ORAL | Status: DC | PRN
  Administered 2021-08-01: 80 mg via ORAL
  Filled 2021-07-30 (×2): qty 1

## 2021-07-30 MED ORDER — LABETALOL HCL 5 MG/ML IV SOLN
INTRAVENOUS | Status: AC
  Administered 2021-07-30: 20 mg via INTRAVENOUS
  Filled 2021-07-30: qty 12

## 2021-07-30 MED ORDER — BUPIVACAINE HCL (PF) 0.5 % IJ SOLN
INTRAMUSCULAR | Status: DC | PRN
  Administered 2021-07-30: 10 mL

## 2021-07-30 MED ORDER — ZOLPIDEM TARTRATE 5 MG PO TABS: 5.0000 mg | ORAL_TABLET | Freq: Every evening | ORAL | Status: DC | PRN

## 2021-07-30 MED ORDER — MORPHINE SULFATE (PF) 0.5 MG/ML IJ SOLN
INTRAMUSCULAR | Status: AC
  Filled 2021-07-30: qty 10

## 2021-07-30 MED ORDER — SENNOSIDES-DOCUSATE SODIUM 8.6-50 MG PO TABS
2.0000 | ORAL_TABLET | ORAL | Status: DC
  Administered 2021-07-30 – 2021-08-01 (×3): 2 via ORAL
  Filled 2021-07-30 (×3): qty 2

## 2021-07-30 MED ORDER — IBUPROFEN 600 MG PO TABS: 600.0000 mg | ORAL_TABLET | Freq: Four times a day (QID) | ORAL | Status: DC

## 2021-07-30 MED ORDER — LACTATED RINGERS IV SOLN: INTRAVENOUS | Status: DC

## 2021-07-30 MED ORDER — OXYCODONE HCL 5 MG PO TABS: 5.0000 mg | ORAL_TABLET | ORAL | Status: DC | PRN

## 2021-07-30 MED ORDER — ORAL CARE MOUTH RINSE: 15.0000 mL | Freq: Once | OROMUCOSAL | Status: AC

## 2021-07-30 MED ORDER — DEXAMETHASONE SODIUM PHOSPHATE 10 MG/ML IJ SOLN
INTRAMUSCULAR | Status: DC | PRN
  Administered 2021-07-30: 8 mg via INTRAVENOUS

## 2021-07-30 MED ORDER — PRENATAL MULTIVITAMIN CH
1.0000 | ORAL_TABLET | Freq: Every day | ORAL | Status: DC
  Administered 2021-07-31 – 2021-08-02 (×3): 1 via ORAL
  Filled 2021-07-30 (×3): qty 1

## 2021-07-30 MED ORDER — SIMETHICONE 80 MG PO CHEW
80.0000 mg | CHEWABLE_TABLET | Freq: Three times a day (TID) | ORAL | Status: DC
  Administered 2021-07-30 – 2021-08-02 (×9): 80 mg via ORAL
  Filled 2021-07-30 (×8): qty 1

## 2021-07-30 MED ORDER — SOD CITRATE-CITRIC ACID 500-334 MG/5ML PO SOLN: 30.0000 mL | ORAL | Status: DC | PRN

## 2021-07-30 MED ORDER — MAGNESIUM SULFATE BOLUS VIA INFUSION
4.0000 g | Freq: Once | INTRAVENOUS | Status: AC
  Administered 2021-07-30: 4 g via INTRAVENOUS
  Filled 2021-07-30: qty 1000

## 2021-07-30 MED ORDER — COCONUT OIL OIL: 1.0000 "application " | TOPICAL_OIL | Status: DC | PRN

## 2021-07-30 MED ORDER — ONDANSETRON HCL 4 MG/2ML IJ SOLN
INTRAMUSCULAR | Status: DC | PRN
Start: 2021-07-30 — End: 2021-07-30
  Administered 2021-07-30: 4 mg via INTRAVENOUS

## 2021-07-30 MED ORDER — FAMOTIDINE 20 MG PO TABS
20.0000 mg | ORAL_TABLET | Freq: Once | ORAL | Status: AC
Start: 2021-07-30 — End: 2021-07-30
  Administered 2021-07-30: 20 mg via ORAL
  Filled 2021-07-30: qty 1

## 2021-07-30 MED ORDER — HYDROMORPHONE HCL 1 MG/ML IJ SOLN: 0.2000 mg | INTRAMUSCULAR | Status: DC | PRN

## 2021-07-30 MED ORDER — OXYTOCIN-SODIUM CHLORIDE 30-0.9 UT/500ML-% IV SOLN
2.5000 [IU]/h | INTRAVENOUS | Status: DC
  Administered 2021-07-30: 30 [IU] via INTRAVENOUS
  Filled 2021-07-30: qty 1000

## 2021-07-30 MED ORDER — CARBOPROST TROMETHAMINE 250 MCG/ML IM SOLN
250.0000 ug | Freq: Once | INTRAMUSCULAR | Status: AC
  Administered 2021-07-30: 250 ug via INTRAMUSCULAR

## 2021-07-30 MED ORDER — DIPHENHYDRAMINE HCL 25 MG PO CAPS: 25.0000 mg | ORAL_CAPSULE | Freq: Four times a day (QID) | ORAL | Status: DC | PRN

## 2021-07-30 MED ORDER — LABETALOL HCL 5 MG/ML IV SOLN
40.0000 mg | INTRAVENOUS | Status: DC | PRN
  Administered 2021-07-30: 40 mg via INTRAVENOUS

## 2021-07-30 MED ORDER — ONDANSETRON HCL 4 MG/2ML IJ SOLN: 4.0000 mg | Freq: Four times a day (QID) | INTRAMUSCULAR | Status: DC | PRN

## 2021-07-30 MED ORDER — METOCLOPRAMIDE HCL 5 MG/ML IJ SOLN
10.0000 mg | Freq: Three times a day (TID) | INTRAMUSCULAR | Status: DC
Start: 2021-07-30 — End: 2021-07-31
  Administered 2021-07-30 – 2021-07-31 (×3): 10 mg via INTRAVENOUS
  Filled 2021-07-30 (×4): qty 2

## 2021-07-30 MED ORDER — LABETALOL HCL 5 MG/ML IV SOLN: 20.0000 mg | INTRAVENOUS | Status: DC | PRN

## 2021-07-30 MED ORDER — PHENYLEPHRINE HCL-NACL 20-0.9 MG/250ML-% IV SOLN
INTRAVENOUS | Status: DC | PRN
Start: 2021-07-30 — End: 2021-07-30
  Administered 2021-07-30: 40 ug/min via INTRAVENOUS

## 2021-07-30 MED ORDER — FLEET ENEMA 7-19 GM/118ML RE ENEM: 1.0000 | ENEMA | Freq: Every day | RECTAL | Status: DC | PRN

## 2021-07-30 MED ORDER — KETOROLAC TROMETHAMINE 30 MG/ML IJ SOLN
30.0000 mg | Freq: Four times a day (QID) | INTRAMUSCULAR | Status: DC | PRN
  Administered 2021-07-30 – 2021-07-31 (×4): 30 mg via INTRAVENOUS
  Filled 2021-07-30 (×4): qty 1

## 2021-07-30 MED ORDER — LABETALOL HCL 5 MG/ML IV SOLN
80.0000 mg | INTRAVENOUS | Status: DC | PRN
  Administered 2021-07-30: 80 mg via INTRAVENOUS
  Filled 2021-07-30: qty 16

## 2021-07-30 MED ORDER — WITCH HAZEL-GLYCERIN EX PADS: 1.0000 "application " | MEDICATED_PAD | CUTANEOUS | Status: DC | PRN

## 2021-07-30 MED ORDER — DIBUCAINE (PERIANAL) 1 % EX OINT: 1.0000 "application " | TOPICAL_OINTMENT | CUTANEOUS | Status: DC | PRN

## 2021-07-30 MED ORDER — BUPIVACAINE 0.25 % ON-Q PUMP DUAL CATH 400 ML
400.0000 mL | INJECTION | Status: DC
  Filled 2021-07-30: qty 400

## 2021-07-30 MED ORDER — FENTANYL CITRATE (PF) 100 MCG/2ML IJ SOLN
INTRAMUSCULAR | Status: DC | PRN
Start: 2021-07-30 — End: 2021-07-30
  Administered 2021-07-30: 15 ug via INTRAVENOUS
  Administered 2021-07-30: 50 ug via INTRAVENOUS
  Administered 2021-07-30: 25 ug via INTRAVENOUS
  Administered 2021-07-30: 10 ug via INTRAVENOUS

## 2021-07-30 MED ORDER — SOD CITRATE-CITRIC ACID 500-334 MG/5ML PO SOLN
ORAL | Status: AC
  Administered 2021-07-30: 30 mL via ORAL
  Filled 2021-07-30: qty 15

## 2021-07-30 MED ORDER — BUPIVACAINE ON-Q PAIN PUMP (FOR ORDER SET NO CHG)
INJECTION | Status: DC
  Filled 2021-07-30: qty 1

## 2021-07-30 SURGICAL SUPPLY — 29 items
BACTOSHIELD CHG 4% 4OZ (MISCELLANEOUS) ×1
CHLORAPREP W/TINT 26 (MISCELLANEOUS) ×4 IMPLANT
DERMABOND ADHESIVE PROPEN (GAUZE/BANDAGES/DRESSINGS) ×1
DERMABOND ADVANCED (GAUZE/BANDAGES/DRESSINGS) ×1
DERMABOND ADVANCED .7 DNX12 (GAUZE/BANDAGES/DRESSINGS) ×1 IMPLANT
DERMABOND ADVANCED .7 DNX6 (GAUZE/BANDAGES/DRESSINGS) IMPLANT
DRSG OPSITE POSTOP 4X10 (GAUZE/BANDAGES/DRESSINGS) ×2 IMPLANT
ELECT CAUTERY BLADE 6.4 (BLADE) ×2 IMPLANT
ELECT REM PT RETURN 9FT ADLT (ELECTROSURGICAL) ×2
ELECTRODE REM PT RTRN 9FT ADLT (ELECTROSURGICAL) ×1 IMPLANT
GLOVE BIOGEL PI IND STRL 6.5 (GLOVE) ×2 IMPLANT
GLOVE BIOGEL PI INDICATOR 6.5 (GLOVE) ×2
GOWN STRL REUS W/ TWL LRG LVL3 (GOWN DISPOSABLE) ×1 IMPLANT
GOWN STRL REUS W/ TWL XL LVL3 (GOWN DISPOSABLE) ×2 IMPLANT
GOWN STRL REUS W/TWL LRG LVL3 (GOWN DISPOSABLE) ×1
GOWN STRL REUS W/TWL XL LVL3 (GOWN DISPOSABLE) ×2
MANIFOLD NEPTUNE II (INSTRUMENTS) ×2 IMPLANT
MAT PREVALON FULL STRYKER (MISCELLANEOUS) ×2 IMPLANT
NS IRRIG 1000ML POUR BTL (IV SOLUTION) ×2 IMPLANT
PACK C SECTION AR (MISCELLANEOUS) ×2 IMPLANT
PAD OB MATERNITY 4.3X12.25 (PERSONAL CARE ITEMS) ×2 IMPLANT
PAD PREP 24X41 OB/GYN DISP (PERSONAL CARE ITEMS) ×2 IMPLANT
SCRUB CHG 4% DYNA-HEX 4OZ (MISCELLANEOUS) ×1 IMPLANT
SUT MNCRL AB 4-0 PS2 18 (SUTURE) ×2 IMPLANT
SUT PLAIN 3-0 (SUTURE) ×2 IMPLANT
SUT VIC AB 0 CT1 36 (SUTURE) ×6 IMPLANT
SUT VIC AB 2-0 CT1 36 (SUTURE) ×2 IMPLANT
SYR 30ML LL (SYRINGE) ×4 IMPLANT
WATER STERILE IRR 500ML POUR (IV SOLUTION) ×2 IMPLANT

## 2021-07-30 NOTE — Discharge Instructions (Signed)
Discharge Instructions:  ? ?Follow-up Appointment: 1 week, please call the office to schedule ? ?If there are any new medications, they have been ordered and will be available for pickup at the listed pharmacy on your way home from the hospital.  ? ?Call the office if you have any of the following: headache, visual changes, fever >101.0 F, chills, shortness of breath, breast concerns, excessive vaginal bleeding, incision drainage or problems, leg pain or redness, depression or any other concerns. If you have vaginal discharge with an odor, let your doctor know.  ? ?It is normal to bleed for up to 6 weeks. You should not soak through more than 1 pad in 1 hour. If you have a blood clot larger than your fist with continued bleeding, call your doctor.  ? ?After a c-section, you should expect a small amount of blood or clear fluid coming from the incision and abdominal cramping/soreness. Inspect your incision site daily. Stand in front of a mirror to look for any redness, incision opening, or discolored/odorness drainage. Take a shower daily and continue good hygiene. Use own towel and washcloth (do not share). Make sure your sheets on your bed are clean. No pets sleeping around your incision site. Dressing will be removed at your postpartum visit. If the dressing does become wet or soiled underneath, it is okay to remove it before your visit.   ? ?On-Q pump: You will remove on day 4 after insertion or if the ball becomes flat before day 4. You will remove on: 08/03/2021 ? ?Activity: Do not lift > 15 lbs for 6 weeks (do not lift anything heavier than your baby). ?No intercourse, tampons, swimming pools, hot tubs, baths (only showers) for 6 weeks.  ?No driving for 1-2 weeks. Do not drive while taking narcotic or opioid pain medication.  ?Continue taking your prenatal vitamin, especially if breastfeeding. ?Increase calories and fluids (water) while breastfeeding.  ? ?Your milk will come in, in the next couple of days (right  now it is colostrum). You may have a slight fever when your milk comes in, but it should go away on its own.  If it does not, and rises above 101 F please call the doctor. You will also feel achy and your breasts will be firm. They will also start to leak. If you are breastfeeding, continue as you have been and you can pump/express milk for comfort.  ? ?If you have too much milk, your breasts can become engorged, which could lead to mastitis. This is an infection of the milk ducts. It can be very painful and you will need to notify your doctor to obtain a prescription for antibiotics. You can also treat it with a shower or hot/cold compress.  ? ?For concerns about your baby, please call your pediatrician.  ?For breastfeeding concerns, the lactation consultant can be reached at [phone].  ? ?Postpartum blues (feelings of happy one minute and sad another minute) are normal for the first few weeks but if it gets worse let your doctor know.  ? ?Congratulations! We enjoyed caring for you and your new bundle of joy!  ? ?

## 2021-07-30 NOTE — Op Note (Signed)
Cesarean Section Procedure Note ?07/30/21 ? ?Pre-operative Diagnosis: ? 1. Complete Previa ? 2. Preeclampsia, severe range BP ?Post-operative Diagnosis: same, delivered. ? ?Procedure: Primary Low Transverse Cesarean Section  ?Surgeon: Adelene Idler MD  ?Assistant(s): Carie Caddy CNM - No other skilled surgical assistant available. ?Anesthesia: Spinal ?Estimated Blood Loss: 575 cc ?Complications: None; patient tolerated the procedure well.  ?Disposition: PACU - hemodynamically stable. ?Condition: stable ?  ?Findings: ?A female infant in the cephalic presentation. ?Amniotic fluid - clear  ?Birth weight: 4 lbs 5oz ?Apgars : pending ?Intact placenta with a three-vessel cord. Grossly normal uterus, tubes and ovaries bilaterally.  No intraabdominal adhesions were noted. ?  ?Procedure Details  ?  ?The patient was taken to operating room, identified as the correct patient and the procedure verified as C-Section Delivery. A time out was held and the above information confirmed. ?After induction of anesthesia, the patient was draped and prepped in the usual sterile manner. A Pfannenstiel incision was made and carried down through the subcutaneous tissue to the fascia. Fascial incision was made and extended transversely with the Mayo scissors. The fascia was separated from the underlying rectus tissue superiorly and inferiorly. The peritoneum was identified and entered bluntly. Peritoneal incision was extended longitudinally. A low transverse hysterotomy was made. The fetus was delivered atraumatically. The umbilical cord was clamped x2 and cut and the infant was handed to the awaiting pediatricians. The placenta was removed intact and appeared normal with a 3-vessel cord. The uterus was exteriorized and cleared of all clot and debris. The hysterotomy was closed with running sutures of 0 Vicryl suture.  A second imbricating layer was placed with the same suture. Excellent hemostasis was observed.  The uterus was returned  to the abdomen. The pelvis was inspected and again, excellent hemostasis was noted. The peritoneum was closed with a running stitch of 2-0 Vicryl. The On Q Pain pump system was then placed.  Trocars were placed through the abdominal wall into the subfascial space and these were used to thread the silver soaker cathaters into place.The rectus muscles were inspected and were hemostatic. The rectus fascia was then reapproximated with running sutures of 0-vicryl, with careful placement not to incorporate the cathaters. Subcutaneous tissues are then irrigated with saline and hemostasis assured with the bovie. The subcutaneous fat was approximated with 3-0 plain and a running stitch.  The skin was closed with 4-0 monocryl suture in a subcuticular fashion followed by skin adhesive. The cathaters are flushed each with 5 mL of Bupivicaine and stabilized into place with dressing. Instrument, sponge, and needle counts were correct prior to the abdominal closure and at the conclusion of the case.  ?The patient tolerated the procedure well and was transferred to the recovery room in stable condition.  ? ?Carie Caddy assisted with this case. This was a high level case requiring a Financial controller. No other assistant was readily available. She assisted with retraction of tissue and application of fundal pressure during the case.  ? ?[name] MD ?[facility], [facility] ?07/30/21 ?2:38 PM ? ? ?

## 2021-07-30 NOTE — Transfer of Care (Signed)
Immediate Anesthesia Transfer of Care Note ? ?Patient: Kari Morales ? ?Procedure(s) Performed: CESAREAN SECTION ? ?Patient Location: Mother/Baby ? ?Anesthesia Type:Spinal ? ?Level of Consciousness: awake, alert  and patient cooperative ? ?Airway & Oxygen Therapy: Patient Spontanous Breathing ? ?Post-op Assessment: Report given to RN and Post -op Vital signs reviewed and stable ? ?Post vital signs: Reviewed and stable ? ?Last Vitals:  ?Vitals Value Taken Time  ?BP 116/76 07/30/21 1447  ?Temp 36.7 ?C 07/30/21 1447  ?Pulse 92 07/30/21 1447  ?Resp 16 07/30/21 1449  ?SpO2 96 % 07/30/21 1447  ?Vitals shown include unvalidated device data. ? ?Last Pain:  ?Vitals:  ? 07/30/21 1447  ?TempSrc: Oral  ?PainSc: 0-No pain  ?   ? ?  ? ?Complications: No notable events documented. ?

## 2021-07-30 NOTE — Lactation Note (Signed)
This note was copied from a baby's chart. ?Lactation Consultation Note ? ?Patient Name: Kari Morales ?Today's Date: 07/30/2021 ?Reason for consult: L&D Initial assessment;Primapara;NICU baby;Early term 37-38.6wks;Infant < 6lbs;Other (Comment) (stat c-section, Preeclampsia) ?Age:27 hours ? ?Lactation team at bedside in LDR5 post Stat C-section d/t pre-eclampsia. Mom delivered a [redacted]w[redacted]d infant who is now in SCN. ? ?Mom will remain in LDR5 for 24hrs receiving Mag2+/ ? ?Maternal Data ?Has patient been taught Hand Expression?: Yes (demonstrated by [facility] student) ?Does the patient have breastfeeding experience prior to this delivery?: No ? ?Feeding ?Mother's Current Feeding Choice: Breast Milk ?Nipple Type: Slow - flow ? ? ?Lactation Tools Discussed/Used ?Tools: Pump ?Breast pump type: Double-Electric Breast Pump ?Pump Education: Setup, frequency, and cleaning;Milk Storage ?Reason for Pumping: SCN baby ?Pumping frequency: q 3 hours ? ?[facility] Student provided mom and support person with education re: pump set-up, use, cleaning, and frequency. ?[facility] Student also taught and demonstrated hand expression- drops obtained. Mom was instructed to utilize hand expression pre/post pumping for optimal milk removal. ?Review of milk labels for bottles should she express any volume (even the smallest amount of drops can be provided to baby) ? ?Interventions ?Interventions: Hand express;Education;DEBP ? ?Discharge ?  ? ?Consult Status ?Consult Status: Follow-up from L&D ? ?Follow-up tomorrow on pumping routine. Encouraged mom to ask for help/assistance if needed with pumping during the night. ? ?Danford Bad ?07/30/2021, 4:17 PM ? ? ? ?

## 2021-07-30 NOTE — Anesthesia Procedure Notes (Signed)
Spinal ? ?Patient location during procedure: OR ?Start time: 07/30/2021 1:20 PM ?Reason for block: surgical anesthesia ?Staffing ?Performed: resident/CRNA  ?Preanesthetic Checklist ?Completed: patient identified, IV checked, site marked, risks and benefits discussed, surgical consent, monitors and equipment checked, pre-op evaluation and timeout performed ?Spinal Block ?Patient position: sitting ?Prep: DuraPrep ?Patient monitoring: heart rate, cardiac monitor, continuous pulse ox and blood pressure ?Approach: midline ?Location: L3-4 ?Injection technique: single-shot ?Needle ?Needle type: Sprotte  ?Needle gauge: 24 G ?Needle length: 9 cm ?Assessment ?Sensory level: T4 ?Events: CSF return ?Additional Notes ?Negative heme, negative paresthesia, no pain with injection, good free flow CSF pre/post injection.  Attempt x1.  Atraumatic. ? ? ? ?

## 2021-07-30 NOTE — H&P (Signed)
OB History & Physical  ? ?History of Present Illness:  ?Chief Complaint:  ? ?HPI:  ?Kari Morales is a 26 y.o. G1P0 female at [redacted]w[redacted]d dated by 7wkUS.  She presents to L&D for severe headache and elevated blood pressures. She woke up this morning with a severe "8/10" headache located in the  back of her head.  She checked her BP and it was 155/109 she rechecked her BP and got a similar reading. Given the severe headache an high blood pressure, she knew she needed to come in.  Denies visual disturbances or RUQ pain. She has taken Tylenol today.  She did have a headache yesterday, but it went away after taking Tylenol.  ?Kari Morales has had early and regular prenatal care.  Her pregnancy is complicated by elevated BMI, complete previa and GHTN.  She was recently started on Labetalol.  ? ?+FM, no CTX, no LOF, no VB ? ?Pregnancy Issues: ?1. BMI 35 ?2. Complete previa ?3. GHTN, now severe preeclampsia  ?4, Sickle cell trait ? ?Maternal Medical History:  ? ?Past Medical History:  ?Diagnosis Date  ? Hypertension   ? history  ? Migraine   ? PCOS (polycystic ovarian syndrome)   ? ? ?Past Surgical History:  ?Procedure Laterality Date  ? DENTAL SURGERY    ? ganglian cyst removal in left hand    ? ~2007 or 2008 per pt  ? GANGLION CYST EXCISION Left   ? x2  ? ? ?Allergies  ?Allergen Reactions  ? Shrimp [Shellfish Allergy] Anaphylaxis  ? ? ?Prior to Admission medications   ?Medication Sig Start Date End Date Taking? Authorizing Provider  ?acetaminophen (TYLENOL) 500 MG tablet Take 500 mg by mouth every 6 (six) hours as needed.   Yes [provider]  ?calcium carbonate (TUMS EX) 750 MG chewable tablet Chew 2 tablets by mouth daily.   Yes [provider]  ?labetalol (NORMODYNE) 200 MG tablet Take 1 tablet (200 mg total) by mouth 2 (two) times daily. 07/26/21  Yes Tresea Mall, CNM  ?Prenat w/o A Vit-FeFum-FePo-FA (CONCEPT OB) 130-92.4-1 MG CAPS Take 1 capsule by mouth daily. 01/28/21  Yes Nadara Mustard, MD   ?diphenhydrAMINE (BENADRYL) 25 MG tablet Take 50 mg by mouth every 6 (six) hours as needed for itching.    [provider]  ? ? ? ?Prenatal care site: [facility] ? ?Social History: She  reports that she quit smoking about 10 months ago. Her smoking use included e-cigarettes and cigars. She has never used smokeless tobacco. She reports that she does not currently use alcohol. She reports that she does not use drugs. ? ?Family History: family history includes Breast cancer in her half-sister; Cancer in her paternal grandfather; Hypertension in her mother; Prostate cancer in her paternal grandfather.  ? ?Review of Systems: A full review of systems was performed and negative except as noted in the HPI.   ? ? ?Physical Exam:  ?Vital Signs: BP (!) 164/104 (BP Location: Right Arm) Comment: treated with 20mg  IV Labetalol  Pulse 79   Temp 97.9 ?F (36.6 ?C) (Oral)   Resp 20   Ht 5\' 4"  (1.626 m)   Wt 92.5 kg   LMP 11/17/2020 (Exact Date) Comment: G1P0  BMI 35.02 kg/m?  ?General: no acute distress.  ?HEENT: normocephalic, atraumatic ?Heart: regular rate & rhythm.  No murmurs/rubs/gallops ?Lungs: clear to auscultation bilaterally, normal respiratory effort ?Abdomen: soft, gravid, non-tender;  EFW: 6lbs ?Pelvic:  ? External: Normal external female genitalia ? Cervix:deferred  ?  ?  Extremities: non-tender, symmetric, +2  edema bilaterally.  DTRs: +2  ?Neurologic: Alert & oriented x 3.   ? ?No results found for this or any previous visit (from the past 24 hour(s)). ? ?Pertinent Results:  ?Prenatal Labs: ?Blood type/Rh B positive   ?Antibody screen neg  ?Rubella Immune  ?Varicella Non Immune  ?RPR NR  ?HBsAg Neg  ?HIV NR  ?GC neg  ?Chlamydia neg  ?Genetic screening negative  ?1 hour GTT 156  ?3 hour GTT 74, 161, 142, 139  ?GBS Positive   ? ?MWN:UUVOZDGU 140, moderate variability, pos accel, neg decel  ?TOCO:irregular, mild, soft resting tone  ?SVE:  deferred ?  ?Cephalic by leopolds ? ?No results found. ? ?Assessment:   ?Kari Morales is a 26 y.o. G1P0 female at [redacted]w[redacted]d with severe preeclampsia.  ? ?Plan:  ?Admit to Labor & Delivery ?CBC, T&S, NPO, IVF,  ?GBS  positive  ?Consents obtained. ?Continuous efm/toco ?C-section at 1300, all persons notified  ?7. PIH labs, labetalol given  ?----- ?Carie Caddy, CNM  ?[facility] ?[name]   ?

## 2021-07-30 NOTE — Anesthesia Preprocedure Evaluation (Signed)
Anesthesia Evaluation  ?Patient identified by MRN, date of birth, ID band ?Patient awake ? ? ? ?Reviewed: ?Allergy & Precautions, H&P , NPO status , Patient's Chart, lab work & pertinent test results, reviewed documented beta blocker date and time  ? ?History of Anesthesia Complications ?Negative for: history of anesthetic complications ? ?Airway ?Mallampati: III ? ?TM Distance: >3 FB ?Neck ROM: full ? ? ? Dental ? ?(+) Dental Advidsory Given, Teeth Intact, Missing, Chipped ?  ?Pulmonary ?neg shortness of breath, neg sleep apnea, neg COPD, neg recent URI, former smoker,  ?  ?Pulmonary exam normal ?breath sounds clear to auscultation ? ? ? ? ? ? Cardiovascular ?Exercise Tolerance: Good ?hypertension, (-) angina(-) Past MI Normal cardiovascular exam(-) dysrhythmias (-) Valvular Problems/Murmurs ?Rhythm:regular Rate:Normal ? ? ?  ?Neuro/Psych ?negative neurological ROS ? negative psych ROS  ? GI/Hepatic ?Neg liver ROS, GERD  ,  ?Endo/Other  ?negative endocrine ROS ? Renal/GU ?negative Renal ROS  ?negative genitourinary ?  ?Musculoskeletal ? ? Abdominal ?  ?Peds ? Hematology ?negative hematology ROS ?(+)   ?Anesthesia Other Findings ?Past Medical History: ?No date: Hypertension ?    Comment:  history ?No date: Migraine ?No date: PCOS (polycystic ovarian syndrome) ? ? Reproductive/Obstetrics ?(+) Pregnancy ? ?  ? ? ? ? ? ? ? ? ? ? ? ? ? ?  ?  ? ? ? ? ? ? ? ? ?Anesthesia Physical ?Anesthesia Plan ? ?ASA: 3 ? ?Anesthesia Plan: Spinal  ? ?Post-op Pain Management:   ? ?Induction:  ? ?PONV Risk Score and Plan: 2 ? ?Airway Management Planned:  ? ?Additional Equipment:  ? ?Intra-op Plan:  ? ?Post-operative Plan:  ? ?Informed Consent: I have reviewed the patients History and Physical, chart, labs and discussed the procedure including the risks, benefits and alternatives for the proposed anesthesia with the patient or authorized representative who has indicated his/her understanding and  acceptance.  ? ? ? ?Dental Advisory Given ? ?Plan Discussed with: Anesthesiologist, CRNA and Surgeon ? ?Anesthesia Plan Comments:   ? ? ? ? ? ? ?Anesthesia Quick Evaluation ? ?

## 2021-07-30 NOTE — Progress Notes (Signed)
Called to access vaginal bleeding after cesarean section. Approx 300 dc of blood loss. No bleeding or passage of clots with fundal massage. Fundus firm and at umbilicus.  ?Some general abdominal distension. Patient was given Hemabate and IV fluid bolus. Vital signs stable. Patient awake and answering questions.  ?Will start on Reglan for distension. Will check CBC. Can give Hemabate every 15-90 minutes as needed for vaginal bleeding up to 8 doses total.  ? ?Adelene Idler MD, FACOG ?[facility], [facility] ?07/30/2021 ?9:39 PM ? ?

## 2021-07-30 NOTE — OB Triage Note (Signed)
Pt Kari Morales 26 y.o. presents to labor and delivery triage reporting increased BP . Pt is a G1P0 at [redacted]w[redacted]d . Pt denies signs and symptons consistent with rupture of membranes or active vaginal bleeding. Pt denies contractions and states positive fetal movement. Pt reports taking BP at home and reporting 150's/100's, she has a headache rating 8/10. Pt took Tylenol last night for her headache and it improved her pain, pt went to sleep and woke up with a headache again. Pt had pre-admit testing but instead came to the ED to get evaluated. Initial BP 160/102, repeat BP 170/103. Pt denies visual disturbances or RUQ pain. PIH protocol initiated. External FM and TOCO applied to non-tender abdomen and assessing. Initial FHR 140.  Provider notified of pt. ? ?

## 2021-07-30 NOTE — Discharge Summary (Signed)
? ?  Postpartum Discharge Summary ? ?Date of Service updated04/14/2023 ?   ?Patient Name: Kari Morales ?DOB: 20-Dec-1995 ?MRN: [id number] ? ?Date of admission: 07/30/2021 ?Delivery date:07/30/2021  ?Delivering provider: Adrian Prows R  ?Date of discharge: 08/02/2021 ? ?Admitting diagnosis: Elevated blood pressure affecting pregnancy in third trimester, antepartum [O16.3] ?Complete placenta previa nos or without hemorrhage, third trimester [O44.03] ?Intrauterine pregnancy: [redacted]w[redacted]d    ?Secondary diagnosis:  Principal Problem: ?  Elevated blood pressure affecting pregnancy in third trimester, antepartum ?Active Problems: ?  Complete placenta previa nos or without hemorrhage, third trimester ?  Postpartum care following cesarean delivery ? ?Additional problems: Preeclampsia    ?Discharge diagnosis: Preterm Pregnancy Delivered and Preeclampsia (severe)                                              ?Post partum procedures: none ?Augmentation: N/A ?Complications: None ? ?Hospital course: Sceduled C/S   26y.o. yo G1P0 at 394w3das admitted to the hospital 07/30/2021 for  Preeclampsia and unscheduled cesarean section with the following indication:Previa.Delivery details are as follows:  ?Membrane Rupture Time/Date: 1:45 PM ,07/30/2021   ?Delivery Method:C-Section, Low Transverse  ?Details of operation can be found in separate operative note.  Patient had an uncomplicated postpartum course.  She is ambulating, tolerating a regular diet, passing flatus, and urinating well. Patient is discharged home in stable condition on  08/02/21 ?       ?Newborn Data: ?Birth date:07/30/2021  ?Birth time:1:47 PM  ?Gender:Female  ?Living status:Living  ?Apgars:8 ,9  ?[id number]    ? ?Magnesium Sulfate received: Yes: Seizure prophylaxis ?BMZ received: Yes ?Rhophylac:No ?MMR:No ?T-DaP:Given prenatally ?Flu: No ?Transfusion:No ? ?Physical exam  ?Vitals:  ? 08/02/21 0311 08/02/21 0313 08/02/21 0735 08/02/21 0900  ?BP: (!) 128/97 (!) 136/92 (!)  141/96 (!) 136/91  ?Pulse: 77 84 95   ?Resp: 18  18   ?Temp: 98.7 ?F (37.1 ?C)  98.2 ?F (36.8 ?C)   ?TempSrc: Oral  Oral   ?SpO2: 100%  100%   ?Weight:      ?Height:      ? ?General: alert, cooperative, and no distress ?Lochia: appropriate ?Uterine Fundus: firm ?Incision: Healing well with no significant drainage, Dressing is clean, dry, and intact ?DVT Evaluation: No evidence of DVT seen on physical exam. ?Negative Homan's sign. ?Labs: ?Lab Results  ?Component Value Date  ? WBC 25.1 (H) 07/31/2021  ? HGB 9.5 (L) 07/31/2021  ? HCT 28.3 (L) 07/31/2021  ? MCV 81.1 07/31/2021  ? PLT 153 07/31/2021  ? ? ?  Latest Ref Rng & Units 07/30/2021  ? 10:33 AM  ?CMP  ?Glucose 70 - 99 mg/dL 77    ?BUN 6 - 20 mg/dL 6    ?Creatinine 0.44 - 1.00 mg/dL 0.68    ?Sodium 135 - 145 mmol/L 135    ?Potassium 3.5 - 5.1 mmol/L 3.0    ?Chloride 98 - 111 mmol/L 105    ?CO2 22 - 32 mmol/L 22    ?Calcium 8.9 - 10.3 mg/dL 8.3    ?Total Protein 6.5 - 8.1 g/dL 6.0    ?Total Bilirubin 0.3 - 1.2 mg/dL 0.8    ?Alkaline Phos 38 - 126 U/L 182    ?AST 15 - 41 U/L 80    ?ALT 0 - 44 U/L 82    ? ?Edinburgh Score: ? ?  07/31/2021  ?  9:20 PM  ?Flavia Shipper Postnatal Depression Scale Screening Tool  ?I have been able to laugh and see the funny side of things. 0  ?I have looked forward with enjoyment to things. 0  ?I have blamed myself unnecessarily when things went wrong. 0  ?I have been anxious or worried for no good reason. 1  ?I have felt scared or panicky for no good reason. 0  ?Things have been getting on top of me. 0  ?I have been so unhappy that I have had difficulty sleeping. 0  ?I have felt sad or miserable. 0  ?I have been so unhappy that I have been crying. 0  ?The thought of harming myself has occurred to me. 0  ?Edinburgh Postnatal Depression Scale Total 1  ? ? ? ? ?After visit meds:  ?Allergies as of 08/02/2021   ? ?   Reactions  ? Shrimp [shellfish Allergy] Anaphylaxis  ? ?  ? ?  ?Medication List  ?  ? ?STOP taking these medications   ? ?labetalol  200 MG tablet ?Commonly known as: NORMODYNE ?  ? ?  ? ?TAKE these medications   ? ?acetaminophen 500 MG tablet ?Commonly known as: TYLENOL ?Take 500 mg by mouth every 6 (six) hours as needed. ?  ?calcium carbonate 750 MG chewable tablet ?Commonly known as: TUMS EX ?Chew 2 tablets by mouth daily. ?  ?Concept OB 130-92.4-1 MG Caps ?Take 1 capsule by mouth daily. ?  ?diphenhydrAMINE 25 MG tablet ?Commonly known as: BENADRYL ?Take 50 mg by mouth every 6 (six) hours as needed for itching. ?  ?ibuprofen 600 MG tablet ?Commonly known as: ADVIL ?Take 1 tablet (600 mg total) by mouth every 6 (six) hours. ?  ?NIFEdipine 60 MG 24 hr tablet ?Commonly known as: Procardia XL ?Take 1 tablet (60 mg total) by mouth daily. ?  ?oxyCODONE 5 MG immediate release tablet ?Commonly known as: Oxy IR/ROXICODONE ?Take 1-2 tablets (5-10 mg total) by mouth every 4 (four) hours as needed for moderate pain. ?  ? ?  ? ?  ?  ? ? ?  ?Discharge Care Instructions  ?(From admission, onward)  ?  ? ? ?  ? ?  Start     Ordered  ? 08/02/21 0000  Leave dressing on - Keep it clean, dry, and intact until clinic visit       ? 08/02/21 0956  ? ?  ?  ? ?  ? ? ? ?Discharge home in stable condition ?Infant Feeding: Bottle and Breast ?Infant Disposition:home with mother ?Discharge instruction: per After Visit Summary and Postpartum booklet. ?Activity: Advance as tolerated. Pelvic rest for 6 weeks.  ?Diet: routine diet ?Anticipated Birth Control: POPs ?Postpartum Appointment:1 week ?Additional Postpartum F/U: Incision check 1 week and BP check 1 week ?Future Appointments: ?Future Appointments  ?Date Time Provider [place]  ?08/12/2021  1:35 PM Constant, Vickii Chafe, MD [facility] None  ? ?Follow up Visit: ? Follow-up Information   ? ? [facility] Follow up in 1 week(s).   ?Why: Please keep your blood pressure check appointment next week and your 6 week post operative physical and contraceptive visit. ?Contact information: ?[address] ?[place] [address] ?[phone] ? ? ?  ?  ? ?  ?  ? ?  ? ? ? ?  ? ?08/02/2021 ?Imagene Riches, CNM ? ? ?

## 2021-07-31 ENCOUNTER — Encounter: Payer: Self-pay | Admitting: Obstetrics and Gynecology

## 2021-07-31 LAB — PREPARE RBC (CROSSMATCH)

## 2021-07-31 LAB — CBC
HCT: 28.3 % — ABNORMAL LOW (ref 36.0–46.0)
Hemoglobin: 9.5 g/dL — ABNORMAL LOW (ref 12.0–15.0)
MCH: 27.2 pg (ref 26.0–34.0)
MCHC: 33.6 g/dL (ref 30.0–36.0)
MCV: 81.1 fL (ref 80.0–100.0)
Platelets: 153 10*3/uL (ref 150–400)
RBC: 3.49 MIL/uL — ABNORMAL LOW (ref 3.87–5.11)
RDW: 13.7 % (ref 11.5–15.5)
WBC: 25.1 10*3/uL — ABNORMAL HIGH (ref 4.0–10.5)
nRBC: 0.1 % (ref 0.0–0.2)

## 2021-07-31 LAB — TYPE AND SCREEN
ABO/RH(D): B POS
Antibody Screen: NEGATIVE
Unit division: 0
Unit division: 0

## 2021-07-31 LAB — BPAM RBC
Blood Product Expiration Date: 202305092359
Blood Product Expiration Date: 202305092359
Unit Type and Rh: 7300
Unit Type and Rh: 7300

## 2021-07-31 LAB — RPR: RPR Ser Ql: NONREACTIVE

## 2021-07-31 MED ORDER — METOCLOPRAMIDE HCL 10 MG PO TABS: 10.0000 mg | ORAL_TABLET | Freq: Three times a day (TID) | ORAL | Status: DC

## 2021-07-31 MED ORDER — METOCLOPRAMIDE HCL 10 MG PO TABS
10.0000 mg | ORAL_TABLET | Freq: Three times a day (TID) | ORAL | Status: DC
Start: 2021-08-01 — End: 2021-08-02
  Administered 2021-08-01 – 2021-08-02 (×5): 10 mg via ORAL
  Filled 2021-07-31 (×6): qty 1

## 2021-07-31 MED ORDER — CALCIUM GLUCONATE 10 % IV SOLN
INTRAVENOUS | Status: AC
  Filled 2021-07-31: qty 10

## 2021-07-31 MED ORDER — NIFEDIPINE ER OSMOTIC RELEASE 30 MG PO TB24
30.0000 mg | ORAL_TABLET | Freq: Every day | ORAL | Status: DC
  Administered 2021-07-31 – 2021-08-02 (×3): 30 mg via ORAL
  Filled 2021-07-31 (×3): qty 1

## 2021-07-31 NOTE — Lactation Note (Signed)
This note was copied from a baby's chart. ?Lactation Consultation Note ? ?Patient Name: Kari Morales ?Today's Date: 07/31/2021 ?  ?Age:27 hours ? ?Lactation update with mom in LDR5- Mom remains on Mag2+ until approximately 1300. She is feeling well, pumped through the evening and early night hours last night, but then did not pump again until 5am. Since pump this morning she has been consistent with every 2 hours. ?LC reviewed how to use the pump itself (Power/Initiation button, suction level), and assisted with initiating another pumping session. ?With anticipation of baby coming to bedside we did discuss what that feeding may look like, feeding plan and supplementation needed. ?Lactation to follow-up with mom and baby. ? ?Danford Bad ?07/31/2021, 9:47 AM ? ? ? ?

## 2021-07-31 NOTE — Progress Notes (Signed)
Subjective: ?Postpartum Day 1: Cesarean Delivery ?Patient reports tolerating PO.  She remains on magnesium sulphate.she denies any headache this morning and her pain is well controlled.Her urine output has been good. ? ?Objective: ?Vital signs in last 24 hours: ?Temp:  [97.1 ?F (36.2 ?C)-98.2 ?F (36.8 ?C)] 98 ?F (36.7 ?C) (04/12 0700) ?Pulse Rate:  [70-106] 88 (04/12 0802) ?Resp:  [12-31] 16 (04/12 0802) ?BP: (113-170)/(71-110) 143/97 (04/12 0802) ?SpO2:  [94 %-100 %] 98 % (04/12 0800) ?Weight:  [92.5 kg] 92.5 kg (04/11 1016) ? ?Physical Exam:  ?General: cooperative, fatigued, no distress, and moderately obese ?Lochia: appropriate ?Uterine Fundus: firm ?Incision: healing well, no significant drainage, Honeycomb dressing is clean and dry. ?DVT Evaluation: No evidence of DVT seen on physical exam. ?Negative Homan's sign. ?And she is wearing SCD hose ? ?Recent Labs  ?  07/30/21 ?2113 07/31/21 ?0605  ?HGB 11.3* 9.5*  ?HCT 33.7* 28.3*  ? ? ?Assessment/Plan: ?Status post Cesarean section. Doing well postoperatively.  ?Continue current care ?Will DC her magnesium this afternoon. ?Likely move to Mother Baby later today. ?Continue Postop orders. Has been started on daily Procardia XL. ?Kari Morales ?07/31/2021, 9:06 AM ? ? ?

## 2021-07-31 NOTE — Anesthesia Postprocedure Evaluation (Signed)
Anesthesia Post Note ? ?Patient: Kari Morales ? ?Procedure(s) Performed: CESAREAN SECTION ? ?Patient location during evaluation: Mother [facility] ?Anesthesia Type: Spinal ?Level of consciousness: oriented and awake and alert ?Pain management: pain level controlled ?Vital Signs Assessment: post-procedure vital signs reviewed and stable ?Respiratory status: spontaneous breathing and respiratory function stable ?Cardiovascular status: blood pressure returned to baseline and stable ?Postop Assessment: no headache, no backache, no apparent nausea or vomiting and able to ambulate ?Anesthetic complications: no ? ? ?No notable events documented. ? ? ?Last Vitals:  ?Vitals:  ? 07/31/21 0800 07/31/21 0802  ?BP:  (!) 143/97  ?Pulse:  88  ?Resp:  16  ?Temp:    ?SpO2: 98%   ?  ?Last Pain:  ?Vitals:  ? 07/31/21 0701  ?TempSrc:   ?PainSc: 0-No pain  ? ? ?  ?  ?  ?  ?  ?  ? ?Lynden Oxford ? ? ? ? ?

## 2021-08-01 ENCOUNTER — Inpatient Hospital Stay
Admission: RE | Admit: 2021-08-01 | Payer: Medicaid Other | Source: Home / Self Care | Admitting: Obstetrics and Gynecology

## 2021-08-01 NOTE — Progress Notes (Signed)
Obstetric Postpartum/PostOperative Daily Progress Note ?Subjective:  ?26 y.o. G1P0101 post-operative day # 2 status post primary cesarean delivery.  She is ambulating, is tolerating po, is voiding spontaneously.  Her pain is well controlled on PO pain medications and On Q pump. Her lochia is less than menses. She is having some difficulties with breastfeeding. ?  ?Medications ?SCHEDULED MEDICATIONS  ? ferrous sulfate  325 mg Oral BID WC  ? ibuprofen  600 mg Oral Q6H  ? metoCLOPramide  10 mg Oral Q8H  ? NIFEdipine  30 mg Oral Daily  ? prenatal multivitamin  1 tablet Oral Q1200  ? senna-docusate  2 tablet Oral Q24H  ? simethicone  80 mg Oral TID PC  ?  ?MEDICATION INFUSIONS  ? bupivacaine ON-Q pain pump    ?  ?PRN MEDICATIONS  ?bisacodyl, coconut oil, witch hazel-glycerin **AND** dibucaine, diphenhydrAMINE, labetalol **AND** labetalol **AND** labetalol **AND** hydrALAZINE **AND** Measure blood pressure, menthol-cetylpyridinium, oxyCODONE, simethicone, sodium phosphate, zolpidem  ? ? ?Objective:  ? ?Vitals:  ? 07/31/21 2120 07/31/21 2354 08/01/21 0300 08/01/21 0741  ?BP: 124/78 135/79 126/89 (!) 136/91  ?Pulse: 94 99 95 88  ?Resp:  18 18 20   ?Temp:  97.7 ?F (36.5 ?C) 98.5 ?F (36.9 ?C) 98.4 ?F (36.9 ?C)  ?TempSrc:  Oral Oral Oral  ?SpO2:  100% 100% 99%  ?Weight:      ?Height:      ? ? Current Vital Signs 24h Vital Sign Ranges  ?T 98.4 ?F (36.9 ?C) Temp  Avg: 98.2 ?F (36.8 ?C)  Min: 97.7 ?F (36.5 ?C)  Max: 98.5 ?F (36.9 ?C)  ?BP (!) 136/91 (nurse Danna notified) ? BP  Min: 120/76  Max: 141/93  ?HR 88 Pulse  Avg: 100.7  Min: 88  Max: 115  ?RR 20 Resp  Avg: 17.8  Min: 16  Max: 20  ?SaO2 99 % Room Air SpO2  Avg: 98.9 %  Min: 97 %  Max: 100 %  ?    ? 24 Hour I/O Current Shift I/O  ?Time ?Ins ?Outs 04/12 0701 - 04/13 0700 ?In: 1399.5 [P.O.:650; I.V.:749.5] ?Out: 4225 [Urine:4225] No intake/output data recorded.  ?General: NAD ?Pulmonary: no increased work of breathing ?Abdomen: non-distended, non-tender, fundus firm at level  of umbilicus ?Inc: Clean/dry/intact, On Q intact ?Extremities: no edema, no erythema, no tenderness ? ?Labs:  ?Recent Labs  ?Lab 07/30/21 ?1033 07/30/21 ?2113 07/31/21 ?09/30/21  ?WBC 14.1* 24.7* 25.1*  ?HGB 11.6* 11.3* 9.5*  ?HCT 34.5* 33.7* 28.3*  ?PLT 146* 174 153  ? ? ? ?Assessment:  ? 26 y.o. G1P0101 postoperative day # 2 status post primary cesarean section, lactating ? ?Plan:  ?1) Acute blood loss anemia - hemodynamically stable and asymptomatic ?- po ferrous sulfate ? ?2) B POS / Rubella <0.90 (11/16 1444)/ Varicella Not immune ? ?3) TDAP status given antepartum ? ?4) Feeding: formula and breast (pumping)/LC to follow up with patient ? ?5) Contraception =  considering pills ? ?6) Disposition: continue current care ? ? ?07-26-1999, CNM ?08/01/2021 9:13 AM  ?  ?

## 2021-08-01 NOTE — Lactation Note (Signed)
This note was copied from a baby's chart. ?Lactation Consultation Note ? ?Patient Name: Kari Morales ?Today's Date: 08/01/2021 ?Reason for consult: Primapara;1st time breastfeeding;Follow-up assessment;Exclusive pumping and bottle feeding;Infant < 6lbs;Late-preterm 34-36.6wks ?Age:27 hours ? ? ?P1 mom who was changing infants diaper when [facility] student entered the room.  We were able to review and demonstrate breast massage and hand expression.  [facility] student was also able to set mom up pumping on initial setting.  Per mom she is pumping every 2-3 hours not really noticing any drops.  Drops noticed on both breast during hand expression.  Reviewed normal pumping goals and expectations.  Also reviewed maternal hydration and rest.  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  Discussed benefits of STS for mom and baby,   ? ?Feeding Plan: ? Continue to pump at least every 3 hours for 15 minutes on initial setting ?Hand express prior to pumping ?Do STS  ?  ?Maternal Data ?Has patient been taught Hand Expression?: Yes ?Does the patient have breastfeeding experience prior to this delivery?: No ? ?Feeding ?Mother's Current Feeding Choice: Breast Milk and Formula ? ? ? ?Lactation Tools Discussed/Used ?Breast pump type: Double-Electric Breast Pump ?Pump Education: Setup, frequency, and cleaning;Milk Storage ?Reason for Pumping: Mom is not latching infant and needs to simulate breast ?Pumping frequency: per mom every 2-3 hours ?Pumped volume: 0 mL (no drops noted mom was pumping as LC student left the room) ? ?Interventions ?Interventions: Breast feeding basics reviewed;DEBP;Skin to skin;Education;Breast massage;Hand express ? ?Discharge ?Discharge Education: Engorgement and breast care ?Pump: DEBP ? ?Consult Status ?Consult Status: Follow-up ?Date: 08/02/21 ?Follow-up type: In-patient ? ? ? ?Forrestine Him ?08/01/2021, 10:55 AM ? ? ? ?

## 2021-08-02 ENCOUNTER — Telehealth: Payer: Self-pay | Admitting: Obstetrics

## 2021-08-02 MED ORDER — NIFEDIPINE ER OSMOTIC RELEASE 30 MG PO TB24
30.0000 mg | ORAL_TABLET | Freq: Once | ORAL | Status: AC
Start: 2021-08-02 — End: 2021-08-02
  Administered 2021-08-02: 30 mg via ORAL
  Filled 2021-08-02: qty 1

## 2021-08-02 MED ORDER — IBUPROFEN 600 MG PO TABS: 600.0000 mg | ORAL_TABLET | Freq: Four times a day (QID) | ORAL | 0 refills | Status: DC

## 2021-08-02 MED ORDER — NIFEDIPINE ER OSMOTIC RELEASE 60 MG PO TB24: 60.0000 mg | ORAL_TABLET | Freq: Every day | ORAL | 11 refills | Status: DC

## 2021-08-02 MED ORDER — OXYCODONE HCL 5 MG PO TABS: 5.0000 mg | ORAL_TABLET | ORAL | 0 refills | Status: DC | PRN

## 2021-08-02 NOTE — Lactation Note (Signed)
This note was copied from a baby's chart. ?Lactation Consultation Note ? ?Patient Name: Kari Morales ?Today's Date: 08/02/2021 ?Reason for consult: Breastfeeding assistance ?Age:27 hours ? ?Maternal Data ? ?See previous [facility] consult. ?Has patient been taught Hand Expression?: Yes ?Mother requested to breastfeed to review position and latch techniques. Mom understands baby is on a late preterm feeding plan. She will offer baby to breastfeed when alert or awake 1-2 times per day for a time limited period for now. She will offer her expressed breastmilk and formula supplement after any breastfeeding attempts. ?Mom will continue to pump to establish and grow her milk supply. She will pick up a DEBP at [facility] this afternoon. ? ?Feeding ?Mother's Current Feeding Choice: Breast Milk and Formula ? ?Assisted mom and baby with breastfeeding. Baby was alert and awake. Mom given assistance to position baby at the right breast in football hold. Baby latched after a few latch attempts, lips flanged outward, and audible swallows noted. Mom and grandma could identify baby's audible swallows. Grandma very supportive.Baby sleepy at about 10 minutes. Mom burped baby and offered the remainder of feed by slow flow bottle. Baby actively took bottle. ? ?LATCH Score ?Latch: Grasps breast easily, tongue down, lips flanged, rhythmical sucking. ? ?Audible Swallowing: A few with stimulation ? ?Type of Nipple: Everted at rest and after stimulation ? ?Comfort (Breast/Nipple): Soft / non-tender ? ?Hold (Positioning): Assistance needed to correctly position infant at breast and maintain latch. ? ?LATCH Score: 8 ? ? ?Lactation Tools Discussed/Used ?  ? ?Interventions ?Interventions: Breast feeding basics reviewed;Assisted with latch;Breast massage;Hand express;Breast compression;Adjust position;Support pillows;Position options;Expressed milk;Hand pump;Education (REviewed feeding plan for late preterm infant, reviewed highlights of mother-baby Enjoy  booklet.) ? ?Discharge ?Discharge Education:  (Reviewed LPT feeding plan. Mom verbalized understanding to continue with plan until baby's Provider recommends changes to plan. Mother verbalized understanding that baby may not fully transition to breastfeeding until closer to the original due date.) ?Pump: [facility] (Mother spoke with [facility] representative and will leave hospital and go to [facility] to pick up DEBP.) ?WIC Program: Yes ? ?Consult Status ?Consult Status: Complete ?Date: 08/02/21 ?Follow-up type: In-patient ? ?Update provided to care nurse. ? ?Fuller Song ?08/02/2021, 1:38 PM ? ? ? ?

## 2021-08-02 NOTE — Progress Notes (Signed)
Pt discharged with infant.  Discharge instructions, prescriptions and follow up appointment given to and reviewed with pt. Pt verbalized understanding. Escorted out by auxillary. 

## 2021-08-02 NOTE — Telephone Encounter (Signed)
Mother Baby called in for pt to get scheduled for next to have BP check with MD but didn't have any open. So had to put her in the same day spot on 08-06-21 with you. ?

## 2021-08-03 DIAGNOSIS — R809 Proteinuria, unspecified: Secondary | ICD-10-CM | POA: Diagnosis not present

## 2021-08-03 DIAGNOSIS — R7401 Elevation of levels of liver transaminase levels: Secondary | ICD-10-CM | POA: Diagnosis not present

## 2021-08-03 DIAGNOSIS — O165 Unspecified maternal hypertension, complicating the puerperium: Secondary | ICD-10-CM | POA: Diagnosis not present

## 2021-08-06 ENCOUNTER — Ambulatory Visit (INDEPENDENT_AMBULATORY_CARE_PROVIDER_SITE_OTHER): Payer: Medicaid Other | Admitting: Obstetrics

## 2021-08-06 VITALS — BP 152/96 | Wt 191.0 lb

## 2021-08-06 DIAGNOSIS — O165 Unspecified maternal hypertension, complicating the puerperium: Secondary | ICD-10-CM

## 2021-08-06 NOTE — Progress Notes (Signed)
? ? ?Obstetrics & Gynecology Office Visit  ? ?Chief Complaint:  ?Chief Complaint  ?Patient presents with  ? Blood Pressure Check  ? Postpartum Care  ? ? ?History of Present Illness: Kari Morales return to the office at 7 days postp delivery for a blood pressure check. She was diagnosed with pre eclampsia and had a complete previa, so was then delivered via Cesarean section on April 11th. Her post operative course was marked by 24 hours of magnexium sulphate, and the start on daily Procardia. She was discharged home on daily Procardia at 60 mg XL. Since her DC, she presented to the [facility] ED for a c/o "weeping drainage from her On -q site. At that visit, the ED provider learned that Kari Morales was not taking her Procardia and  she was found to be hypertensive. She reports today that she has taken her medication over the last three days. ?She denies any headache or dizziness today. She is requesting her dressing be removed from her incision. ?  ?Review of Systems:  ?Review of Systems  ?Constitutional: Negative.   ?HENT: Negative.    ?Eyes: Negative.   ?Respiratory: Negative.    ?Cardiovascular: Negative.   ?Gastrointestinal: Negative.   ?Genitourinary: Negative.   ?Musculoskeletal: Negative.   ?Skin: Negative.   ?Neurological: Negative.   ?Endo/Heme/Allergies: Negative.   ?Psychiatric/Behavioral: Negative.     ? ?Past Medical History:  ?Past Medical History:  ?Diagnosis Date  ? Hypertension   ? history  ? Migraine   ? PCOS (polycystic ovarian syndrome)   ? ? ?Past Surgical History:  ?Past Surgical History:  ?Procedure Laterality Date  ? CESAREAN SECTION N/A 07/30/2021  ? Procedure: CESAREAN SECTION;  Surgeon: [name], MD;  Location: [facility] ORS;  Service: Obstetrics;  Laterality: N/A;  ? DENTAL SURGERY    ? ganglian cyst removal in left hand    ? ~2007 or 2008 per pt  ? GANGLION CYST EXCISION Left   ? x2  ? ? ?Gynecologic History: No LMP recorded. ? ?Obstetric History: G1P0101 ? ?Family History:  ?Family History   ?Problem Relation Age of Onset  ? Hypertension Mother   ? Cancer Paternal Grandfather   ? Prostate cancer Paternal Grandfather   ? Breast cancer Half-Sister   ? ? ?Social History:  ?Social History  ? ?Socioeconomic History  ? Marital status: Significant Other  ?  Spouse name: Kari Morales  ? Number of children: Not on file  ? Years of education: Not on file  ? Highest education level: Not on file  ?Occupational History  ? Not on file  ?Tobacco Use  ? Smoking status: Former  ?  Types: E-cigarettes, Cigars  ?  Quit date: 09/19/2020  ?  Years since quitting: 0.8  ? Smokeless tobacco: Never  ?Vaping Use  ? Vaping Use: Former  ? Start date: 09/03/2019  ?Substance and Sexual Activity  ? Alcohol use: Not Currently  ?  Comment: occassionally  ? Drug use: Never  ? Sexual activity: Yes  ?  Birth control/protection: None  ?Other Topics Concern  ? Not on file  ?Social History Narrative  ? Not on file  ? ?Social Determinants of Health  ? ?Financial Resource Strain: Not on file  ?Food Insecurity: Not on file  ?Transportation Needs: Not on file  ?Physical Activity: Not on file  ?Stress: Not on file  ?Social Connections: Not on file  ?Intimate Partner Violence: Not on file  ? ? ?Allergies:  ?Allergies  ?Allergen Reactions  ? [employer]  Allergy] Anaphylaxis  ? ? ?Medications: ?Prior to Admission medications   ?Medication Sig Start Date End Date Taking? Authorizing Provider  ?acetaminophen (TYLENOL) 500 MG tablet Take 500 mg by mouth every 6 (six) hours as needed.    [provider]  ?calcium carbonate (TUMS EX) 750 MG chewable tablet Chew 2 tablets by mouth daily.    [provider]  ?diphenhydrAMINE (BENADRYL) 25 MG tablet Take 50 mg by mouth every 6 (six) hours as needed for itching.    [provider]  ?ibuprofen (ADVIL) 600 MG tablet Take 1 tablet (600 mg total) by mouth every 6 (six) hours. 08/02/21   Mirna Mires, CNM  ?NIFEdipine (PROCARDIA XL) 60 MG 24 hr tablet Take 1 tablet (60 mg total) by  mouth daily. 08/02/21 08/02/22  Mirna Mires, CNM  ?oxyCODONE (OXY IR/ROXICODONE) 5 MG immediate release tablet Take 1-2 tablets (5-10 mg total) by mouth every 4 (four) hours as needed for moderate pain. 08/02/21   Mirna Mires, CNM  ?Prenat w/o A Vit-FeFum-FePo-FA (CONCEPT OB) 130-92.4-1 MG CAPS Take 1 capsule by mouth daily. 01/28/21   Nadara Mustard, MD  ? ? ?Physical Exam ?Vitals:  ?Vitals:  ? 08/06/21 1052  ?BP: (!) 152/96  ? ?No LMP recorded. ? ?Physical Exam ?Vitals reviewed.  ?Constitutional:   ?   Appearance: Normal appearance. She is obese.  ?HENT:  ?   Head:  ?   Comments: Denies any headache or visual changes ?Cardiovascular:  ?   Rate and Rhythm: Normal rate and regular rhythm.  ?Pulmonary:  ?   Effort: Pulmonary effort is normal.  ?   Breath sounds: Normal breath sounds.  ?Genitourinary: ?   Comments: deferred ?Musculoskeletal:     ?   General: Normal range of motion.  ?   Cervical back: Normal range of motion and neck supple.  ?Skin: ?   General: Skin is warm and dry.  ?Neurological:  ?   General: No focal deficit present.  ?   Mental Status: She is alert and oriented to person, place, and time.  ?Psychiatric:     ?   Mood and Affect: Mood normal.     ?   Behavior: Behavior normal.  ? ? ? ?Assessment: 26 y.o. G1P0101 for blood pressure check at one week post operative ?Hypertension today- non compliance with medication ?CS incision line healing well ? ?Plan: ?Problem List Items Addressed This Visit   ? ?  ? Other  ? Postpartum care following cesarean delivery  ? ?Other Visit Diagnoses   ? ? Postpartum hypertension    -  Primary  ? ?  ? Honeycomb dressing is removed today and Incision checked- intact with no drainage. ?Clean telfa pad placed over incision, and the patient is advised to clean this area carefully. ? ?Strongly advised to continue on the Procardia 60 mg XL.  ?RTC in one week for another blood pressure check. ? ?Mirna Mires, CNM  ?08/06/2021 2:40 PM  ? ? ? ? ?

## 2021-08-12 ENCOUNTER — Encounter: Payer: Self-pay | Admitting: Obstetrics and Gynecology

## 2021-08-12 ENCOUNTER — Ambulatory Visit (INDEPENDENT_AMBULATORY_CARE_PROVIDER_SITE_OTHER): Payer: Medicaid Other | Admitting: Obstetrics and Gynecology

## 2021-08-12 VITALS — BP 124/82 | Ht 64.0 in | Wt 183.0 lb

## 2021-08-12 DIAGNOSIS — Z4889 Encounter for other specified surgical aftercare: Secondary | ICD-10-CM

## 2021-08-12 NOTE — Progress Notes (Signed)
26 yo P1 here for incision check following a primary cesarean section on 07/30/21 at 36 weeks secondary to a complete previa and severe preeclampsia. Patient reports feeling well and pain well controlled with tylenol only. She denies any fever, drainage or significant incisional pain. She is breastfeeding and denies any concerns for postpartum depression. She receives ample assistance from her partner ? ?Past Medical History:  ?Diagnosis Date  ? Hypertension   ? history  ? Migraine   ? PCOS (polycystic ovarian syndrome)   ? ?Past Surgical History:  ?Procedure Laterality Date  ? CESAREAN SECTION N/A 07/30/2021  ? Procedure: CESAREAN SECTION;  Surgeon: [name], MD;  Location: [facility] ORS;  Service: Obstetrics;  Laterality: N/A;  ? DENTAL SURGERY    ? ganglian cyst removal in left hand    ? ~2007 or 2008 per pt  ? GANGLION CYST EXCISION Left   ? x2  ? ?Family History  ?Problem Relation Age of Onset  ? Hypertension Mother   ? Cancer Paternal Grandfather   ? Prostate cancer Paternal Grandfather   ? Breast cancer Half-Sister   ? ?Social History  ? ?Tobacco Use  ? Smoking status: Former  ?  Types: E-cigarettes, Cigars  ?  Quit date: 09/19/2020  ?  Years since quitting: 0.8  ? Smokeless tobacco: Never  ?Vaping Use  ? Vaping Use: Former  ? Start date: 09/03/2019  ?Substance Use Topics  ? Alcohol use: Not Currently  ?  Comment: occassionally  ? Drug use: Never  ? ?ROS ?See pertinent in HPI. All other systems reviewed and non contributory ?Blood pressure 124/82, height 5\' 4"  (1.626 m), weight 183 lb (83 kg), not currently breastfeeding. ?GENERAL: Well-developed, well-nourished female in no acute distress.  ?ABDOMEN: Soft, nontender, nondistended. No organomegaly. ?Incision: healing well without erythema, induration or drainage ?PELVIC: Not indicated ?EXTREMITIES: No cyanosis, clubbing, or edema, 2+ distal pulses. ? ?A/P 26 yo here for incision and BP check ?- Wound care instructions provided ?- Continue nifedipine for BP  control ?- RTC in 4-5 weeks for postpartum exam and re-evaluation of need for anti-hypertensive ?

## 2021-09-18 ENCOUNTER — Ambulatory Visit (INDEPENDENT_AMBULATORY_CARE_PROVIDER_SITE_OTHER): Payer: Medicaid Other | Admitting: Obstetrics and Gynecology

## 2021-09-18 ENCOUNTER — Encounter: Payer: Self-pay | Admitting: Obstetrics and Gynecology

## 2021-09-18 LAB — POCT URINE PREGNANCY: Preg Test, Ur: NEGATIVE

## 2021-09-18 MED ORDER — NORETHIN ACE-ETH ESTRAD-FE 1-20 MG-MCG(24) PO TABS: 1.0000 | ORAL_TABLET | Freq: Every day | ORAL | 11 refills | Status: DC

## 2021-09-18 NOTE — Progress Notes (Signed)
[place] Partum Visit Note  Kari Morales is a 26 y.o. G29P0101 female who presents for a postpartum visit. She is 6 weeks postpartum following a primary cesarean section.  I have fully reviewed the prenatal and intrapartum course. The delivery was at 36.3 gestational weeks due to a placenta previa.  Anesthesia: spinal. Postpartum course has been complicated by postpartum hypertension and patient was provided an Rx procardia which she has since stopped taking. Baby is doing well. Baby is feeding by bottle - Enfamil AR. Bleeding no bleeding. Bowel function is normal. Bladder function is normal. Patient is sexually active. Contraception method is condoms. Postpartum depression screening: negative.   The pregnancy intention screening data noted above was reviewed. Potential methods of contraception were discussed. The patient elected to proceed with No data recorded.   Edinburgh Postnatal Depression Scale - 09/18/21 1507       Edinburgh Postnatal Depression Scale:  In the Past 7 Days   I have been able to laugh and see the funny side of things. 0    I have looked forward with enjoyment to things. 0    I have blamed myself unnecessarily when things went wrong. 0    I have been anxious or worried for no good reason. 0    I have felt scared or panicky for no good reason. 0    Things have been getting on top of me. 0    I have been so unhappy that I have had difficulty sleeping. 0    I have felt sad or miserable. 0    I have been so unhappy that I have been crying. 0    The thought of harming myself has occurred to me. 0    Edinburgh Postnatal Depression Scale Total 0             Health Maintenance Due  Topic Date Due   COVID-19 Vaccine (1) Never done   HPV VACCINES (1 - 2-dose series) Never done       Review of Systems Pertinent items noted in HPI and remainder of comprehensive ROS otherwise negative.  Objective:  BP 128/84   Ht 5\' 4"  (1.626 m)   Wt 180 lb (81.6 kg)   BMI 30.90  kg/m    General:  alert, cooperative, and no distress   Breasts:  normal  Lungs: clear to auscultation bilaterally  Heart:  regular rate and rhythm  Abdomen: soft, non-tender; bowel sounds normal; no masses,  no organomegaly   Wound well approximated incision  GU exam:  not indicated       Assessment:    1. Routine postpartum follow-up Normal postpartum exam.   Plan:   Essential components of care per ACOG recommendations:  1.  Mood and well being: Patient with negative depression screening today. Reviewed local resources for support.  - Patient tobacco use? No.   - hx of drug use? No.    2. Infant care and feeding:  -Patient currently breastmilk feeding? No.  -Social determinants of health (SDOH) reviewed in EPIC. No concerns  3. Sexuality, contraception and birth spacing - Patient does not want a pregnancy in the next year.  Desired family size is 2 children.  - Reviewed reproductive life planning. Reviewed contraceptive methods based on pt preferences and effectiveness.  Patient desired Oral Contraceptive today.  Rx Loestrin provided - Discussed birth spacing of 18 months  4. Sleep and fatigue -Encouraged family/partner/community support of 4 hrs of uninterrupted sleep to help with mood  and fatigue  5. Physical Recovery  - Discussed patients delivery and complications. She describes her labor as good. - Patient had a C-section. Patient expressed understanding - Patient has urinary incontinence? No. - Patient is safe to resume physical and sexual activity  6.  Health Maintenance - HM due items addressed Yes - Last pap smear  Diagnosis  Date Value Ref Range Status  12/18/2020   Final   - Negative for intraepithelial lesion or malignancy (NILM)   Pap smear not done at today's visit.  -Breast Cancer screening indicated? No.   7. Chronic Disease/Pregnancy Condition follow up: Patient normotensive today without medication. Patient advised to monitor BP monthly  -  PCP follow up  Mora Bellman, [place] [facility] [place]

## 2022-01-08 ENCOUNTER — Other Ambulatory Visit: Payer: Self-pay

## 2022-01-08 ENCOUNTER — Encounter: Payer: Self-pay | Admitting: Emergency Medicine

## 2022-01-08 ENCOUNTER — Emergency Department
Admission: EM | Admit: 2022-01-08 | Discharge: 2022-01-08 | Disposition: A | Discharge status: Home / Self Care | Payer: Medicaid Other | Attending: Emergency Medicine | Admitting: Emergency Medicine

## 2022-01-08 DIAGNOSIS — N939 Abnormal uterine and vaginal bleeding, unspecified: Secondary | ICD-10-CM | POA: Insufficient documentation

## 2022-01-08 DIAGNOSIS — M5431 Sciatica, right side: Secondary | ICD-10-CM | POA: Diagnosis not present

## 2022-01-08 DIAGNOSIS — M5441 Lumbago with sciatica, right side: Secondary | ICD-10-CM | POA: Diagnosis not present

## 2022-01-08 LAB — URINALYSIS, ROUTINE W REFLEX MICROSCOPIC
Bilirubin Urine: NEGATIVE
Glucose, UA: NEGATIVE mg/dL
Ketones, ur: NEGATIVE mg/dL
Leukocytes,Ua: NEGATIVE
Nitrite: NEGATIVE
Protein, ur: NEGATIVE mg/dL
Specific Gravity, Urine: 1.015 (ref 1.005–1.030)
pH: 5.5 (ref 5.0–8.0)

## 2022-01-08 LAB — BASIC METABOLIC PANEL
Anion gap: 9 (ref 5–15)
BUN: 8 mg/dL (ref 6–20)
CO2: 25 mmol/L (ref 22–32)
Calcium: 9.6 mg/dL (ref 8.9–10.3)
Chloride: 107 mmol/L (ref 98–111)
Creatinine, Ser: 1.02 mg/dL — ABNORMAL HIGH (ref 0.44–1.00)
GFR, Estimated: >60 mL/min (ref >60)
Glucose, Bld: 92 mg/dL (ref 70–99)
Potassium: 3.8 mmol/L (ref 3.5–5.1)
Sodium: 141 mmol/L (ref 135–145)

## 2022-01-08 LAB — URINALYSIS, MICROSCOPIC (REFLEX)

## 2022-01-08 LAB — CBC
HCT: 45 % (ref 36.0–46.0)
Hemoglobin: 14.5 g/dL (ref 12.0–15.0)
MCH: 24.9 pg — ABNORMAL LOW (ref 26.0–34.0)
MCHC: 32.2 g/dL (ref 30.0–36.0)
MCV: 77.3 fL — ABNORMAL LOW (ref 80.0–100.0)
Platelets: 222 10*3/uL (ref 150–400)
RBC: 5.82 MIL/uL — ABNORMAL HIGH (ref 3.87–5.11)
RDW: 15.1 % (ref 11.5–15.5)
WBC: 7.5 10*3/uL (ref 4.0–10.5)
nRBC: 0 % (ref 0.0–0.2)

## 2022-01-08 LAB — POC URINE PREG, ED: Preg Test, Ur: NEGATIVE

## 2022-01-08 MED ORDER — CYCLOBENZAPRINE HCL 10 MG PO TABS: 10.0000 mg | ORAL_TABLET | Freq: Three times a day (TID) | ORAL | 0 refills | Status: AC | PRN

## 2022-01-08 NOTE — ED Triage Notes (Signed)
Pt via POV from home. Pt c/o sharp pain in her R leg pain for the past 4 days, states it feels like a sharp numbing. States that pain triggered when she walks.   Pt also that she had a period earlier this month but then had some heavy vaginal bleeding for 4-5 days for the past week. Pt recently stopped taking BC a couple days ago. Pt recently pregnant. Pt is A&OX4 and NAD

## 2022-01-08 NOTE — ED Provider Notes (Signed)
[facility] Provider Note   Event Date/Time   First MD Initiated Contact with Patient 01/08/22 1820     (approximate) History  Leg Pain and Vaginal Bleeding  HPI Kari Morales is a 26 y.o. female with no stated past medical history who presents for 2 separate complaints including right lower extremity shooting pain as well abnormal vaginal bleeding.  Patient states that she just stopped her birth control medication 2 days prior to having heavy vaginal bleeding over the last 3 days.  Patient patient denies any orthostatic symptoms or significant chest pain/shortness of breath with exertion.  Patient's other complaint is a right lower extremity pain that she states is worsened with walking and feels like a shooting sharp pain that is 7/10 in intensity and begins in the right buttock extending down to the back of the knee.  Patient denies any recent trauma but does state that she went for a long walk the other day that was somewhat strenuous and that is when she began having this pain.  Patient denies any other exacerbating or relieving factors for this pain. ROS: Patient currently denies any vision changes, tinnitus, difficulty speaking, facial droop, sore throat, chest pain, shortness of breath, abdominal pain, nausea/vomiting/diarrhea, dysuria   Physical Exam  Triage Vital Signs: ED Triage Vitals  Enc Vitals Group     BP 01/08/22 1716 (!) 171/103     Pulse Rate 01/08/22 1716 (!) 103     Resp 01/08/22 1716 18     Temp 01/08/22 1716 99 F (37.2 C)     Temp Source 01/08/22 1716 Oral     SpO2 01/08/22 1716 100 %     Weight 01/08/22 1713 177 lb (80.3 kg)     Height 01/08/22 1713 5\' 4"  (1.626 m)     Head Circumference --      Peak Flow --      Pain Score 01/08/22 1713 7     Pain Loc --      Pain Edu? --      Excl. in [place]? --    Most recent vital signs: Vitals:   01/08/22 1716 01/08/22 1949  BP: (!) 171/103 (!) 158/88  Pulse: (!) 103 94  Resp: 18 16  Temp: 99  F (37.2 C) 98.8 F (37.1 C)  SpO2: 100% 100%   General: Awake, oriented x4. CV:  Good peripheral perfusion.  Resp:  Normal effort.  Abd:  No distention.  Patient deferred pelvic exam Other:  Young adult overweight African-American female laying in bed in no acute distress.  Straight leg raise negative ED Results / Procedures / Treatments  Labs (all labs ordered are listed, but only abnormal results are displayed) Labs Reviewed  CBC - Abnormal; Notable for the following components:      Result Value   RBC 5.82 (*)    MCV 77.3 (*)    MCH 24.9 (*)    All other components within normal limits  BASIC METABOLIC PANEL - Abnormal; Notable for the following components:   Creatinine, Ser 1.02 (*)    All other components within normal limits  URINALYSIS, ROUTINE W REFLEX MICROSCOPIC - Abnormal; Notable for the following components:   Hgb urine dipstick TRACE (*)    All other components within normal limits  URINALYSIS, MICROSCOPIC (REFLEX) - Abnormal; Notable for the following components:   Bacteria, UA RARE (*)    All other components within normal limits  POC URINE PREG, ED  PROCEDURES: Critical Care performed: No .  1-3 Lead EKG Interpretation  Performed by: Naaman Plummer, MD Authorized by: Naaman Plummer, MD    MEDICATIONS ORDERED IN ED: Medications - No data to display IMPRESSION / MDM / [place] / ED COURSE  I reviewed the triage vital signs and the nursing notes.                             The patient is on the cardiac monitor to evaluate for evidence of arrhythmia and/or significant heart rate changes Patient's presentation is most consistent with acute presentation with potential threat to life or bodily function. Patient is a 26 year old female with the above past medical history who presents for 2 separate complaints including abnormal vaginal bleeding and what seems to be sciatic back pain on the right.  Patient shows no red flag symptomatology as far as  her back pain goes including low suspicion for spinal epidural abscess, vertebral fractures, cauda equina, or significant cord compression.  Given the description of patient's symptoms as starting the upper buttock and radiating down the back of her leg to the knee certainly fits with sciatic back pain and without any history of trauma, this is likely a musculoskeletal injury that will resolve with rehabilitation exercises and anti-inflammatories.  As far as patient's abnormal vaginal bleeding, patient recently stopped her birth control with subsequent abnormal vaginal bleeding likely due to stopping this birth control medicine.  Patient has a negative pregnancy test at this time but does show UA positive for hemoglobin likely secondary to patient's abnormal vaginal bleeding.  Patient deferred pelvic exam at this time stating that she will follow-up with her OB/GYN as needed.  The patient has been reexamined and is ready to be discharged.  All diagnostic results have been reviewed and discussed with the patient/family.  Care plan has been outlined and the patient/family understands all current diagnoses, results, and treatment plans.  There are no new complaints, changes, or physical findings at this time.  All questions have been addressed and answered.  Patient was instructed to, and agrees to follow-up with their primary care physician as well as return to the emergency department if any new or worsening symptoms develop.  Dispo: Discharge home with PCP/OB/GYN follow-up   FINAL CLINICAL IMPRESSION(S) / ED DIAGNOSES   Final diagnoses:  Right sciatic nerve pain  Abnormal vaginal bleeding   Rx / DC Orders   ED Discharge Orders          Ordered    cyclobenzaprine (FLEXERIL) 10 MG tablet  3 times daily PRN        01/08/22 1909           Note:  This document was prepared using Dragon voice recognition software and may include unintentional dictation errors.   Naaman Plummer, MD 01/08/22  2008

## 2022-01-08 NOTE — Discharge Instructions (Addendum)
Please use ibuprofen (Motrin) up to 800 mg every 8 hours, naproxen (Naprosyn) up to 500 mg every 12 hours, and/or acetaminophen (Tylenol) up to 4 g/day for any continued pain 

## 2022-02-07 ENCOUNTER — Ambulatory Visit: Payer: Medicaid Other | Admitting: Advanced Practice Midwife

## 2022-05-15 DIAGNOSIS — E860 Dehydration: Secondary | ICD-10-CM | POA: Diagnosis not present

## 2022-05-15 DIAGNOSIS — R1110 Vomiting, unspecified: Secondary | ICD-10-CM | POA: Diagnosis not present

## 2022-05-29 DIAGNOSIS — Z1389 Encounter for screening for other disorder: Secondary | ICD-10-CM | POA: Diagnosis not present

## 2022-05-29 DIAGNOSIS — F1290 Cannabis use, unspecified, uncomplicated: Secondary | ICD-10-CM | POA: Diagnosis not present

## 2022-05-29 DIAGNOSIS — F1020 Alcohol dependence, uncomplicated: Secondary | ICD-10-CM | POA: Diagnosis not present

## 2022-05-29 DIAGNOSIS — Z309 Encounter for contraceptive management, unspecified: Secondary | ICD-10-CM | POA: Diagnosis not present

## 2022-05-29 DIAGNOSIS — F53 Postpartum depression: Secondary | ICD-10-CM | POA: Diagnosis not present

## 2022-05-29 DIAGNOSIS — E669 Obesity, unspecified: Secondary | ICD-10-CM | POA: Diagnosis not present

## 2022-05-29 DIAGNOSIS — Z202 Contact with and (suspected) exposure to infections with a predominantly sexual mode of transmission: Secondary | ICD-10-CM | POA: Diagnosis not present

## 2022-05-29 DIAGNOSIS — F411 Generalized anxiety disorder: Secondary | ICD-10-CM | POA: Diagnosis not present

## 2022-05-29 DIAGNOSIS — F17200 Nicotine dependence, unspecified, uncomplicated: Secondary | ICD-10-CM | POA: Diagnosis not present

## 2022-05-29 DIAGNOSIS — Z0000 Encounter for general adult medical examination without abnormal findings: Secondary | ICD-10-CM | POA: Diagnosis not present

## 2022-05-29 DIAGNOSIS — E282 Polycystic ovarian syndrome: Secondary | ICD-10-CM | POA: Diagnosis not present

## 2022-05-29 DIAGNOSIS — I10 Essential (primary) hypertension: Secondary | ICD-10-CM | POA: Diagnosis not present

## 2022-05-29 DIAGNOSIS — Z1331 Encounter for screening for depression: Secondary | ICD-10-CM | POA: Diagnosis not present

## 2022-06-16 DIAGNOSIS — Z91013 Allergy to seafood: Secondary | ICD-10-CM | POA: Diagnosis not present

## 2022-06-16 DIAGNOSIS — I10 Essential (primary) hypertension: Secondary | ICD-10-CM | POA: Diagnosis not present

## 2022-06-24 DIAGNOSIS — Z309 Encounter for contraceptive management, unspecified: Secondary | ICD-10-CM | POA: Diagnosis not present

## 2022-06-24 DIAGNOSIS — E282 Polycystic ovarian syndrome: Secondary | ICD-10-CM | POA: Diagnosis not present

## 2022-06-24 DIAGNOSIS — Z3200 Encounter for pregnancy test, result unknown: Secondary | ICD-10-CM | POA: Diagnosis not present

## 2022-06-24 DIAGNOSIS — Z202 Contact with and (suspected) exposure to infections with a predominantly sexual mode of transmission: Secondary | ICD-10-CM | POA: Diagnosis not present

## 2022-06-24 DIAGNOSIS — F1020 Alcohol dependence, uncomplicated: Secondary | ICD-10-CM | POA: Diagnosis not present

## 2022-06-24 DIAGNOSIS — F411 Generalized anxiety disorder: Secondary | ICD-10-CM | POA: Diagnosis not present

## 2022-06-24 DIAGNOSIS — Z1331 Encounter for screening for depression: Secondary | ICD-10-CM | POA: Diagnosis not present

## 2022-06-24 DIAGNOSIS — Z1389 Encounter for screening for other disorder: Secondary | ICD-10-CM | POA: Diagnosis not present

## 2022-06-24 DIAGNOSIS — I10 Essential (primary) hypertension: Secondary | ICD-10-CM | POA: Diagnosis not present

## 2022-06-24 DIAGNOSIS — Z0131 Encounter for examination of blood pressure with abnormal findings: Secondary | ICD-10-CM | POA: Diagnosis not present

## 2022-09-11 IMAGING — US US OB COMP +14 WK
1 of 2 series · 13 of 28 positions shown · non-contrast
Comparison: none

CLINICAL DATA: Pregnancy.  Assess fetal anatomy

EXAM:
OBSTETRICAL ULTRASOUND >14 WKS

[Series 1: us ob comp + 14 wk · 70 acquisitions, 13 frames shown]
[im 3/70]
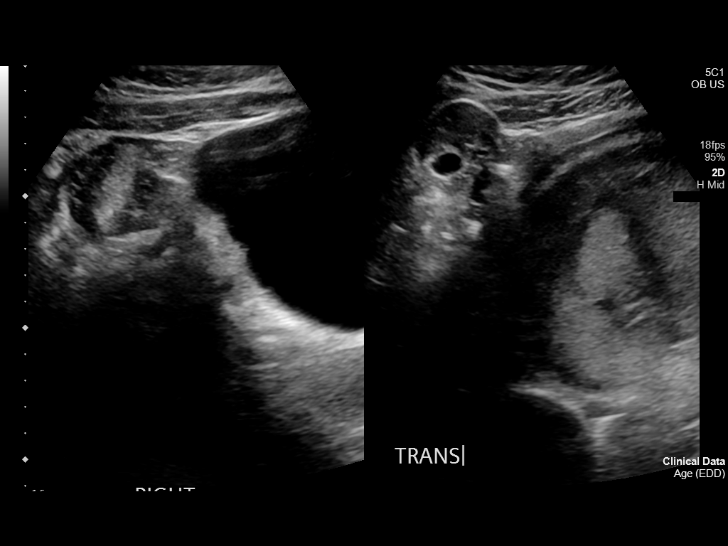
[im 8/70]
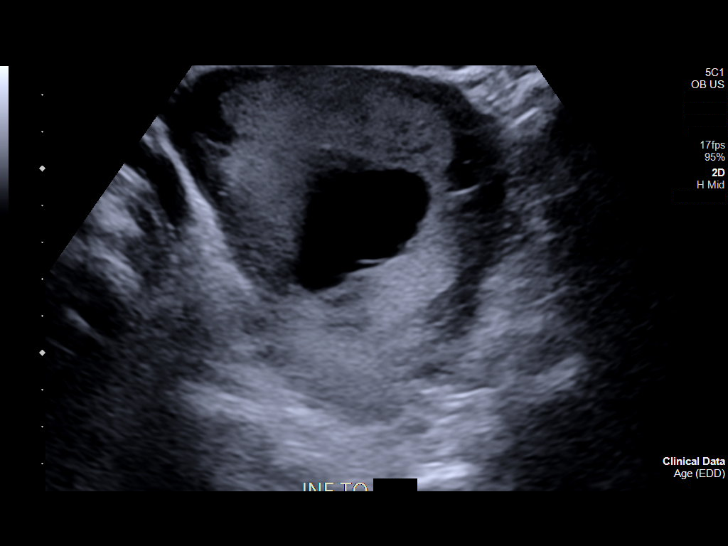
[im 14/70]
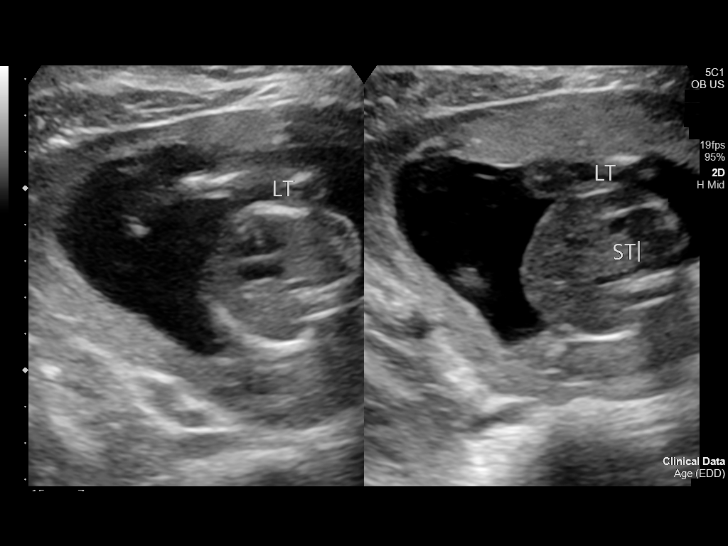
[im 19/70]
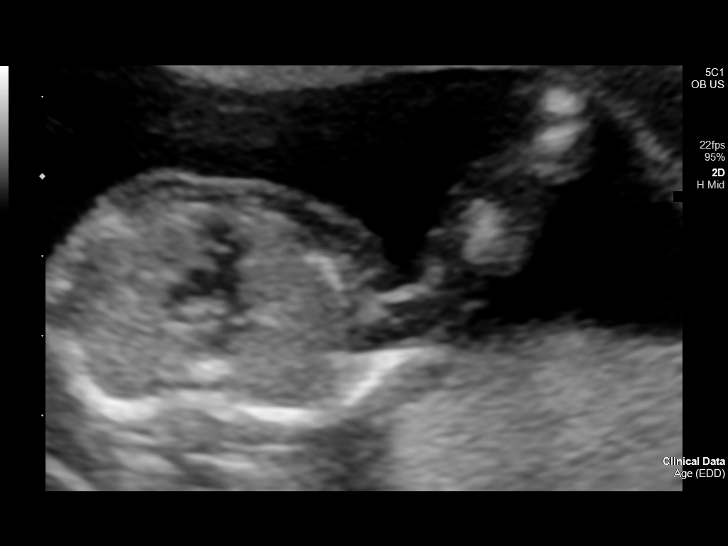
[im 24/70]
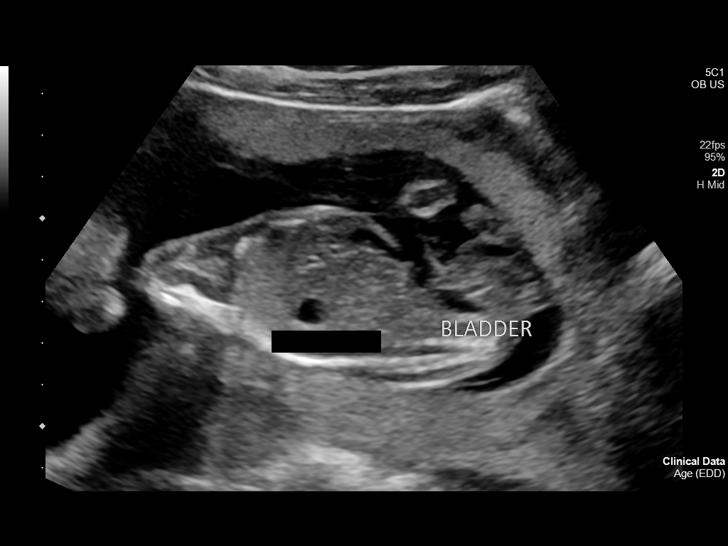
[im 30/70]
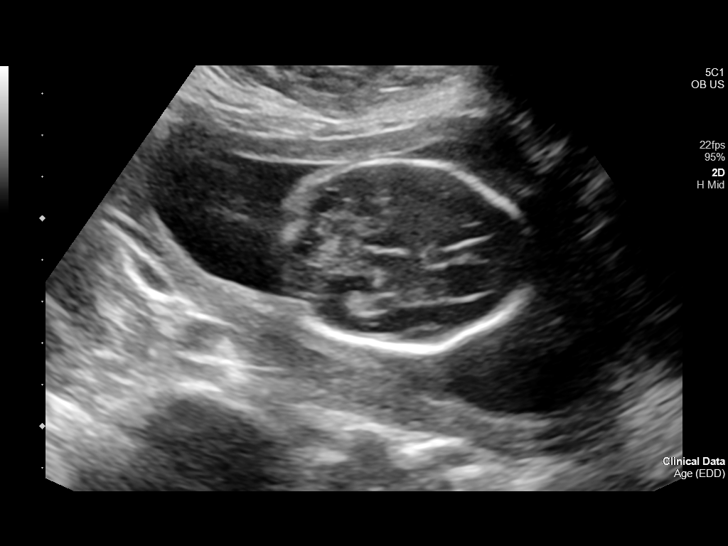
[im 38/70]
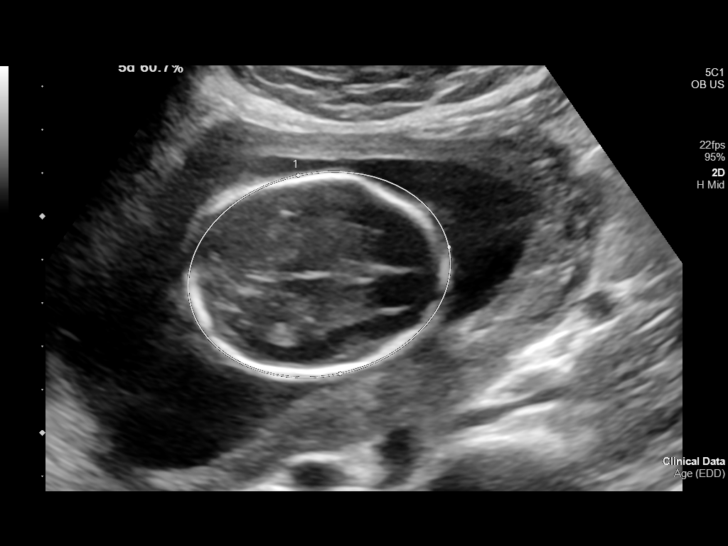
[im 43/70]
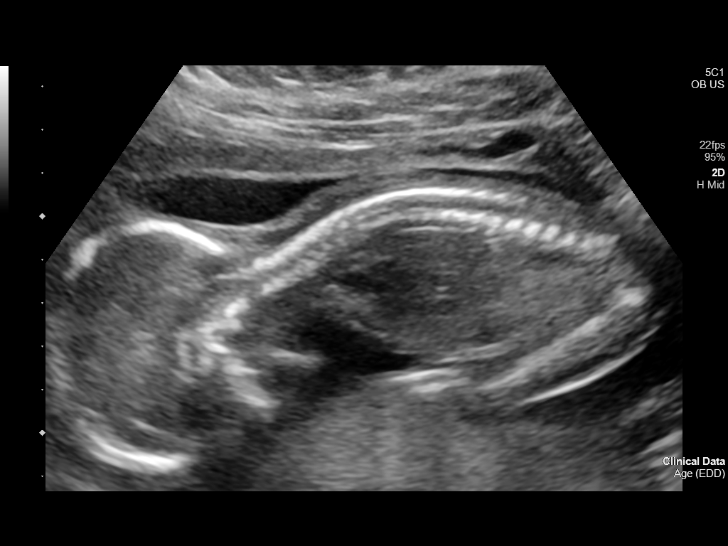
[im 48/70]
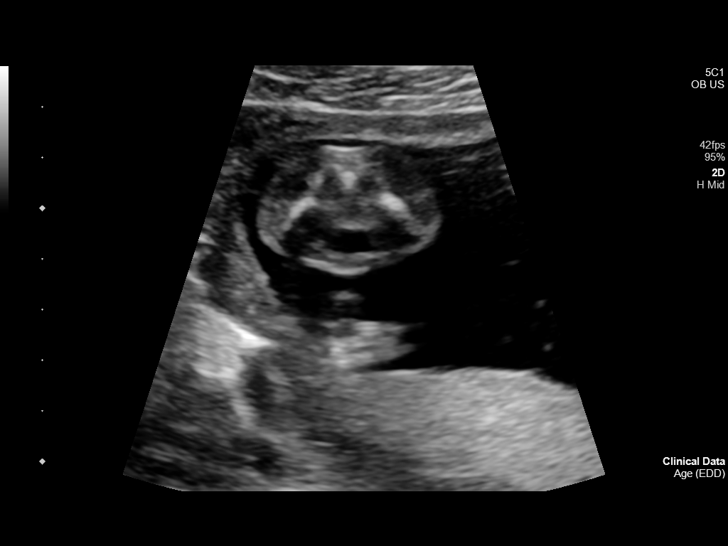
[im 54/70]
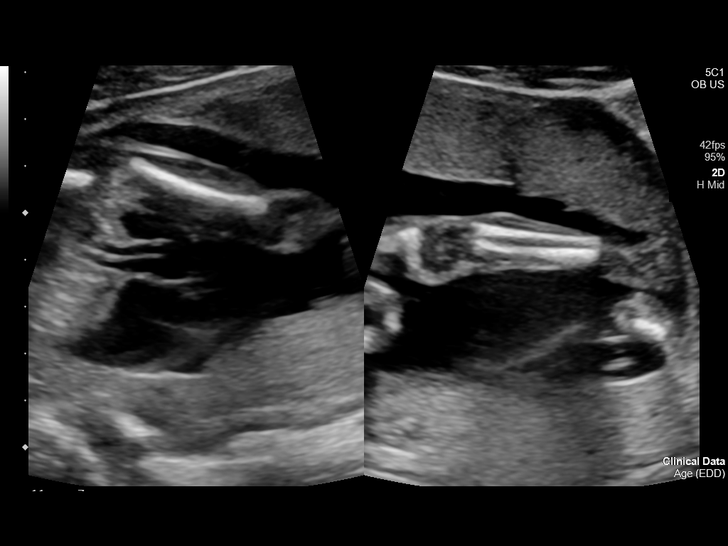
[im 59/70]
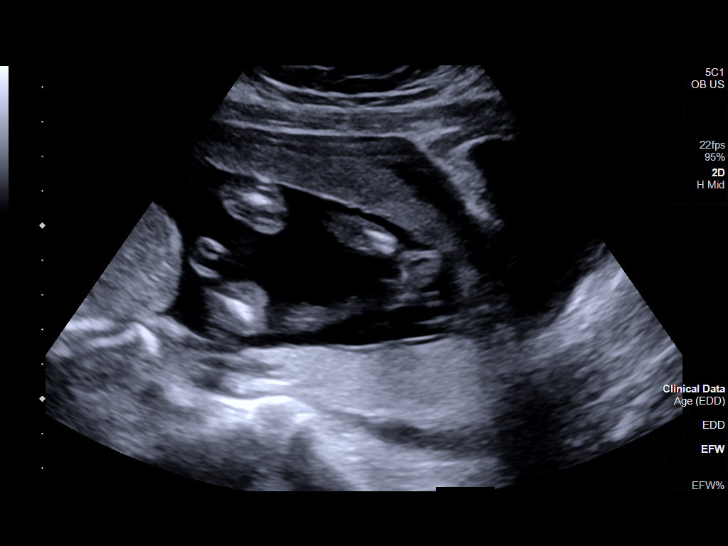
[im 64/70]
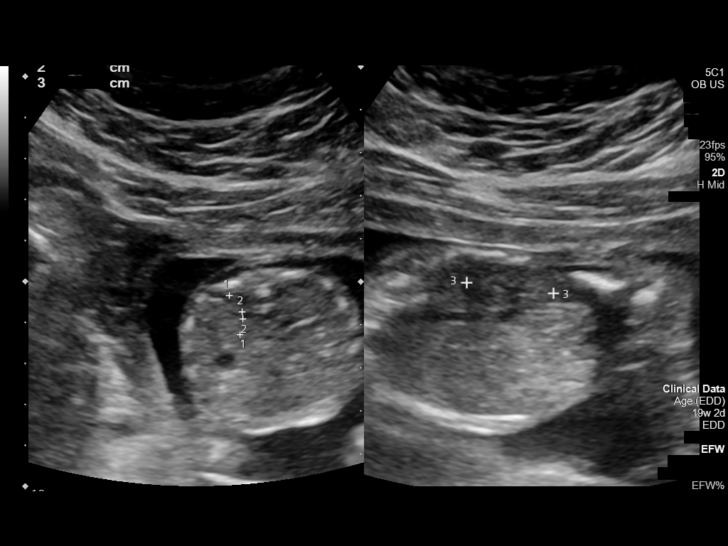
[im 70/70]
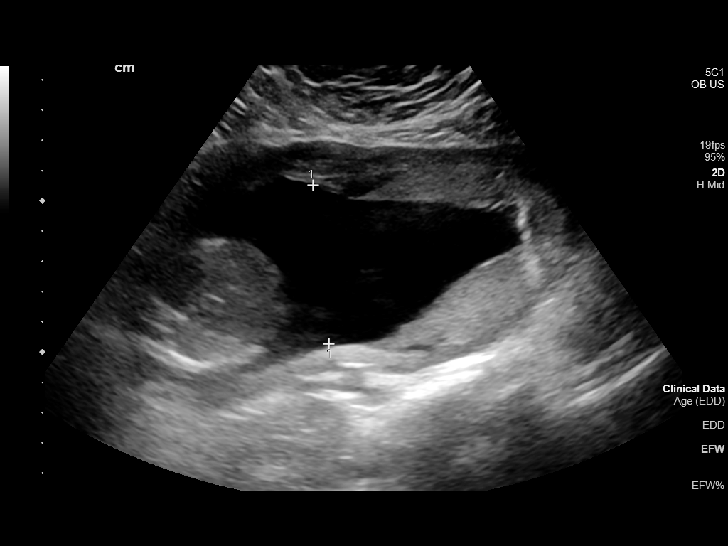

[13 of 28 positions shown; findings below may reference images not displayed]

FINDINGS: Number of Fetuses: 1

Heart Rate:  140 bpm

Movement: Yes

Presentation: Breech

Previa: Complete placenta previa.

Placental Location: Anterior-left lateral

Amniotic Fluid (Subjective): Normal

Amniotic Fluid (Objective):

Vertical pocket 5.3cm

FETAL BIOMETRY

BPD:  4.5cm 19w 4d

HC:    17.1cm 19w 5d

AC:   13.8cm 19w 2d

FL:   3.2cm 19w 6d

Current Mean GA: 19w 5d US EDC: 08/21/2021

Assigned GA: 19w 1d Assigned EDC: 08/25/2021

Estimated Fetal Weight:  299g

FETAL ANATOMY

Lateral Ventricles: Appears normal

Thalami/CSP: Appears normal

Posterior Fossa: Appears normal

Nuchal Region: Appears normal    NFT= 4 mm

Upper Lip: Appears normal

Spine: Appears normal

4 Chamber Heart on Left: Appears normal

LVOT: Appears normal

RVOT: Appears normal

Stomach on Left: Appears normal

3 Vessel Cord: Appears normal

Cord Insertion site: Appears normal

Kidneys: Appears normal

Bladder: Appears normal

Extremities: Appears normal

Sex: Male

Technical Limitations: None.

Maternal Findings:

Cervix:  Closed.  9.2 cm.
IMPRESSION: 1. Single live intrauterine gestation in breech presentation.
2. Complete placenta previa. Attention on follow-up, as clinically
indicated.
3. Complete fetal anatomic survey.  No fetal anomaly detected.

## 2022-10-09 ENCOUNTER — Emergency Department
Admission: EM | Admit: 2022-10-09 | Discharge: 2022-10-09 | Disposition: A | Discharge status: Home / Self Care | Payer: Medicaid Other | Attending: Emergency Medicine | Admitting: Emergency Medicine

## 2022-10-09 ENCOUNTER — Other Ambulatory Visit: Payer: Self-pay

## 2022-10-09 DIAGNOSIS — M545 Low back pain, unspecified: Secondary | ICD-10-CM | POA: Diagnosis not present

## 2022-10-09 DIAGNOSIS — I10 Essential (primary) hypertension: Secondary | ICD-10-CM | POA: Insufficient documentation

## 2022-10-09 DIAGNOSIS — M5440 Lumbago with sciatica, unspecified side: Secondary | ICD-10-CM

## 2022-10-09 DIAGNOSIS — M5441 Lumbago with sciatica, right side: Secondary | ICD-10-CM | POA: Diagnosis not present

## 2022-10-09 LAB — URINALYSIS, ROUTINE W REFLEX MICROSCOPIC
Bilirubin Urine: NEGATIVE
Glucose, UA: NEGATIVE mg/dL
Hgb urine dipstick: NEGATIVE
Ketones, ur: NEGATIVE mg/dL
Nitrite: NEGATIVE
Protein, ur: NEGATIVE mg/dL
Specific Gravity, Urine: 1.014 (ref 1.005–1.030)
pH: 5 (ref 5.0–8.0)

## 2022-10-09 LAB — POC URINE PREG, ED: Preg Test, Ur: NEGATIVE

## 2022-10-09 MED ORDER — LIDOCAINE 5 % EX PTCH: 1.0000 | MEDICATED_PATCH | Freq: Two times a day (BID) | CUTANEOUS | 0 refills | Status: AC

## 2022-10-09 MED ORDER — KETOROLAC TROMETHAMINE 15 MG/ML IJ SOLN
15.0000 mg | Freq: Once | INTRAMUSCULAR | Status: AC
  Administered 2022-10-09: 15 mg via INTRAMUSCULAR
  Filled 2022-10-09: qty 1

## 2022-10-09 MED ORDER — LIDOCAINE 5 % EX PTCH
1.0000 | MEDICATED_PATCH | CUTANEOUS | Status: DC
  Administered 2022-10-09: 1 via TRANSDERMAL
  Filled 2022-10-09: qty 1

## 2022-10-09 MED ORDER — ACETAMINOPHEN 500 MG PO TABS
1000.0000 mg | ORAL_TABLET | Freq: Once | ORAL | Status: AC
  Administered 2022-10-09: 1000 mg via ORAL
  Filled 2022-10-09: qty 2

## 2022-10-09 MED ORDER — KETOROLAC TROMETHAMINE 10 MG PO TABS: 10.0000 mg | ORAL_TABLET | Freq: Three times a day (TID) | ORAL | 0 refills | Status: DC | PRN

## 2022-10-09 NOTE — ED Provider Notes (Signed)
[facility] Emergency Department Provider Note     Event Date/Time   First MD Initiated Contact with Patient 10/09/22 1504     (approximate)   History   Back Pain   HPI  Kari Morales is a 27 y.o. female with a history of hypertension who presents to the emergency department for evaluation of lower back pain with onset x 3 days.  Pain is described as dull and intermittent.  No radiation. pain is worsened with walking.  Pain score 1/10 with no movement and at rest.  Tylenol completely resolves pain at home. Associated sx of unbalanced gait to left side. Hx of sciatica.  Today symptoms are similar.  Patient endorses she now works 12 hour shifts since a month ago and notices pain more frequently. Denies dysuria, hematuria, abdominal pain, flank pain, loss of bladder and bowel control, headache, trauma.   No other complaints at this time.      Physical Exam   Triage Vital Signs: ED Triage Vitals [10/09/22 1456]  Enc Vitals Group     BP (!) 132/102     Pulse Rate 78     Resp 18     Temp 98.3 F (36.8 C)     Temp Source Oral     SpO2 99 %     Weight 177 lb 0.5 oz (80.3 kg)     Height 5\' 4"  (1.626 m)     Head Circumference      Peak Flow      Pain Score 5     Pain Loc      Pain Edu?      Excl. in [place]?     Most recent vital signs: Vitals:   10/09/22 1456  BP: (!) 132/102  Pulse: 78  Resp: 18  Temp: 98.3 F (36.8 C)  SpO2: 99%   General: Well appearing. Alert and oriented. INAD.   Head:  NCAT.  Eyes:  PERRLA. EOMI. Conjunctivae clear. Neck:   No cervical spine tenderness to palpation.   CV:  Good peripheral perfusion. RRR.  RESP:  Normal effort. LCTAB.  ABD:  No distention. Soft, Non tender.  No CVA tenderness. BACK:  Spinous process is midline without deformity or tenderness.  Left side Lumbar paraspinal muscle tenderness. (+) Left SLRT. MSK:   Full ROM in all joints.  Gait is normal and steady. NEURO: Cranial nerves II-XII intact. No  focal deficits. Sensation and motor function intact.    ED Results / Procedures / Treatments   Labs (all labs ordered are listed, but only abnormal results are displayed) Labs Reviewed  URINALYSIS, ROUTINE W REFLEX MICROSCOPIC - Abnormal; Notable for the following components:      Result Value   Color, Urine YELLOW (*)    APPearance HAZY (*)    Leukocytes,Ua MODERATE (*)    Bacteria, UA FEW (*)    All other components within normal limits  POC URINE PREG, ED    No results found.   PROCEDURES:  Critical Care performed: No  Procedures   MEDICATIONS ORDERED IN ED: Medications  ketorolac (TORADOL) 15 MG/ML injection 15 mg (15 mg Intramuscular Given 10/09/22 1536)  acetaminophen (TYLENOL) tablet 1,000 mg (1,000 mg Oral Given 10/09/22 1535)     IMPRESSION / MDM / ASSESSMENT AND PLAN / ED COURSE  I reviewed the triage vital signs and the nursing notes.  27 y.o. female presents to the emergency department for evaluation and treatment of acute lower back pain clinically consistent with sciatica. See HPI for further details. Vital signs are stable with the exception of a slightly elevated BP of 132/102.   Differential diagnosis includes, but is not limited to sciatica, muscle spasm, cauda equina syndrome, kidney stone.  Urinalysis reveals moderate leukocytes and few bacteria.  Given that patient is asymptomatic patient will not need antibiotics at this time.  Patient is made aware if symptoms develop antibiotics may be needed for UTI.   The patient was administered Tylenol and a Toradol injection resulting in complete resolution of symptoms.  Given this, I do have suspicion that this is a musculoskeletal etiology with sciatica involvement. Patient is in satisfactory and stable condition for discharge and outpatient follow up. Patient will be discharged home with prescriptions for short course of Toradol and topical lidocaine patches. Patient is to follow  up with primary care as needed or otherwise directed. Patient is given ED precautions to return to the ED for any worsening or new symptoms. Patient verbalizes understanding. All questions and concerns were addressed during ED visit.    Patient's presentation is most consistent with acute illness / injury with system symptoms.  FINAL CLINICAL IMPRESSION(S) / ED DIAGNOSES   Final diagnoses:  Acute right-sided low back pain with sciatica, sciatica laterality unspecified     Rx / DC Orders   ED Discharge Orders          Ordered    ketorolac (TORADOL) 10 MG tablet  Every 8 hours PRN        10/09/22 1626    lidocaine (LIDODERM) 5 %  Every 12 hours        10/09/22 1626             Note:  This document was prepared using Dragon voice recognition software and may include unintentional dictation errors.    Romeo Apple, Khyson Sebesta A, PA-C 10/09/22 2228    Chesley Noon, MD 10/14/22 [phone]

## 2022-10-09 NOTE — ED Triage Notes (Signed)
Pt here with severe back pain. Pt has a hx of sciatica and states her back pain is getting worse now that she works 12 hours and is standing. Pt states she was told that she also leaning to the left, unsure if that is related to her sciatica or not. Pt ambulatory to triage.

## 2022-10-09 NOTE — ED Notes (Signed)
See triage notes. Patient stated she has had sciatic nerve issues in the past. Stated her back pain started three days ago and that her mother stated the patient's body was "leaning." Patient stated the pain has progressively gotten worse and she is now unable to hold her son. Rates pain at 8/10 when she is walking and 0/10 when laying still.

## 2022-10-10 DIAGNOSIS — M5432 Sciatica, left side: Secondary | ICD-10-CM | POA: Diagnosis not present

## 2022-10-10 DIAGNOSIS — M533 Sacrococcygeal disorders, not elsewhere classified: Secondary | ICD-10-CM | POA: Diagnosis not present

## 2022-10-10 DIAGNOSIS — Z1389 Encounter for screening for other disorder: Secondary | ICD-10-CM | POA: Diagnosis not present

## 2022-10-10 DIAGNOSIS — Z0131 Encounter for examination of blood pressure with abnormal findings: Secondary | ICD-10-CM | POA: Diagnosis not present

## 2022-11-06 IMAGING — US US OB FOLLOW-UP
1 series · 13 of 28 positions shown · non-contrast
Comparison: none

CLINICAL DATA: Placenta Pred via

EXAM:
OBSTETRICAL ULTRASOUND >14 WKS

[Series 1: us ob follow-up · 0.22mm/px · 81 acquisitions, 13 frames shown]
[im 3/81]
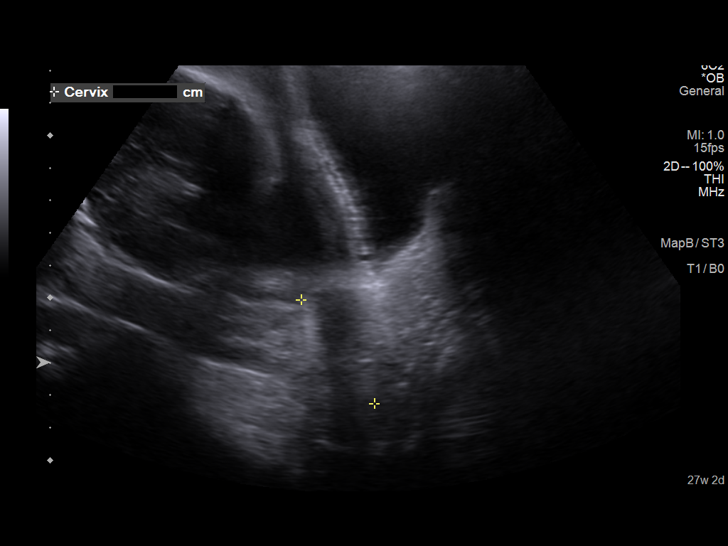
[im 9/81]
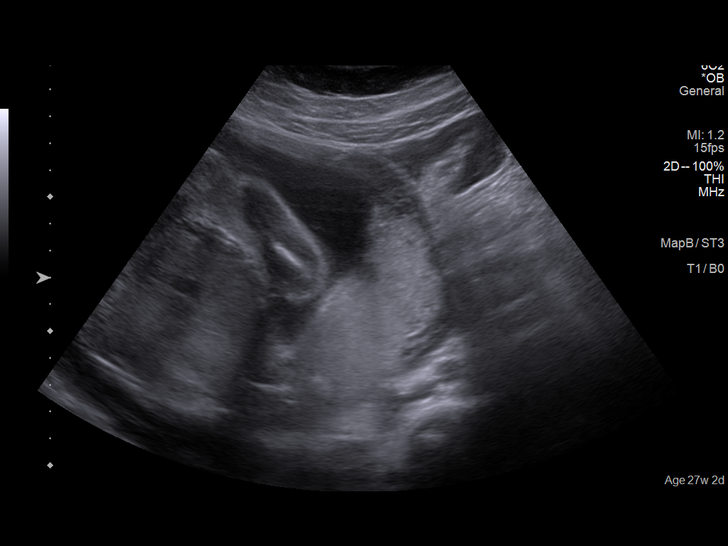
[im 15/81]
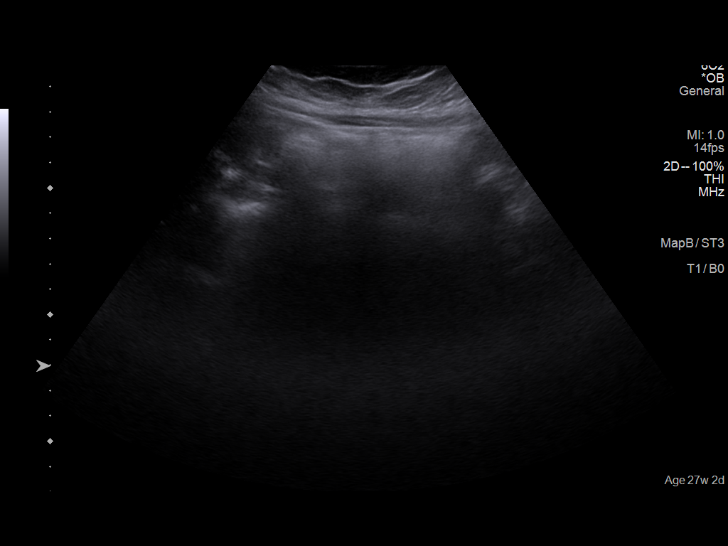
[im 21/81]
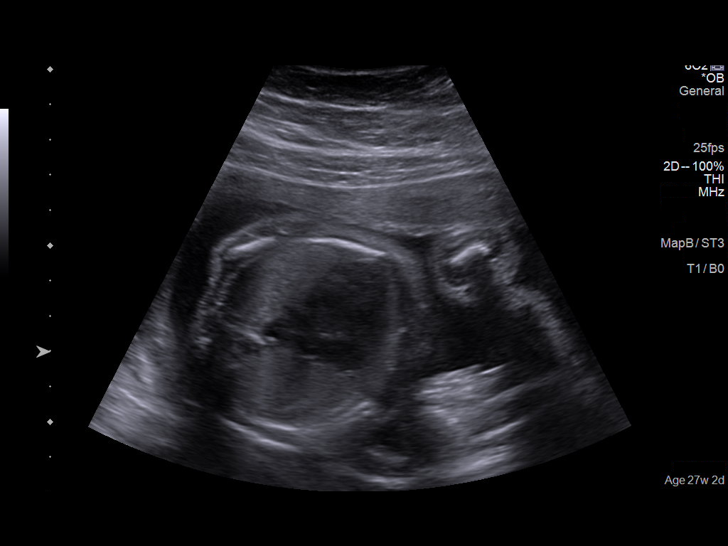
[im 27/81]
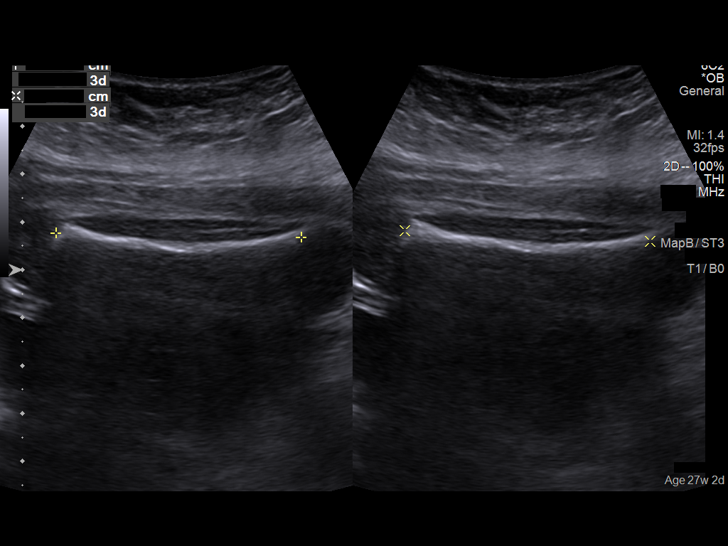
[im 33/81]
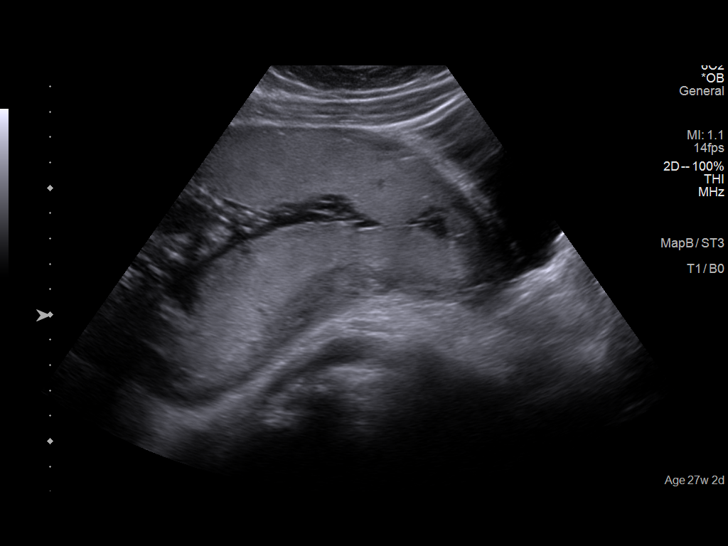
[im 42/81]
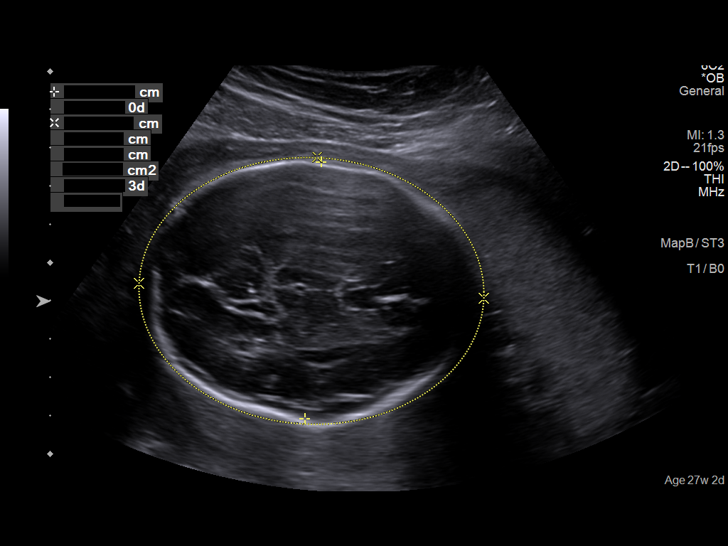
[im 48/81]
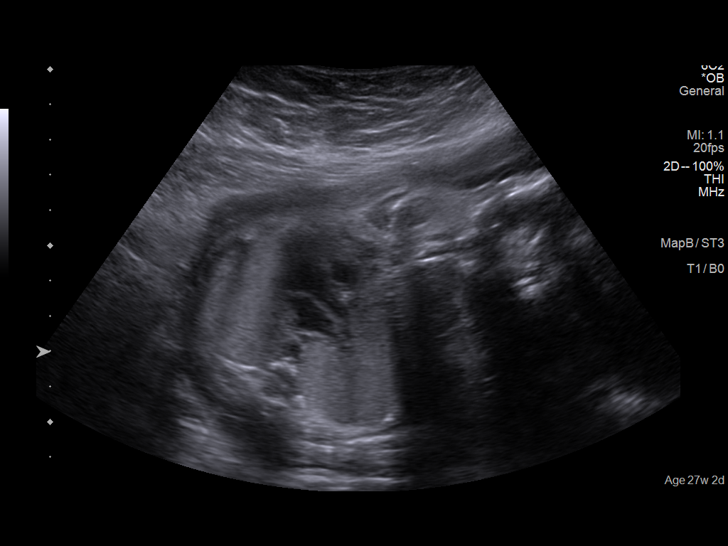
[im 54/81]
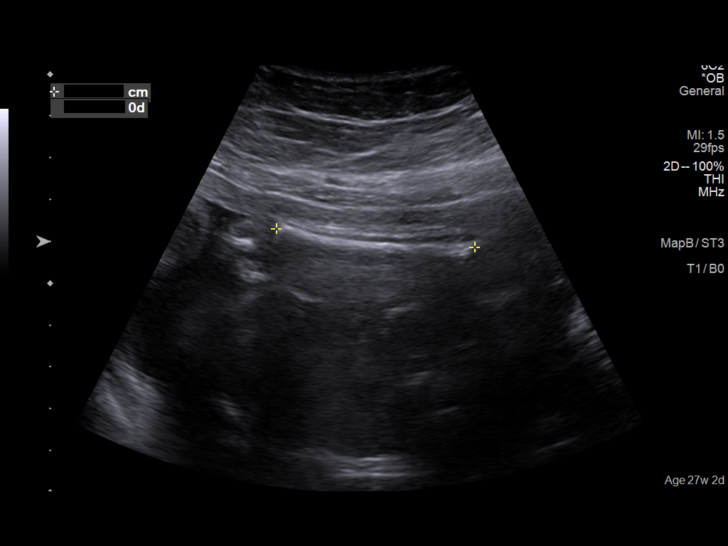
[im 60/81]
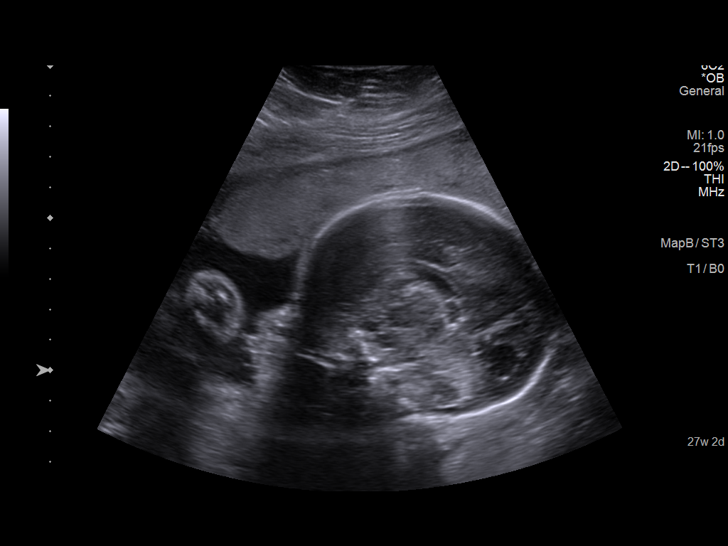
[im 66/81]
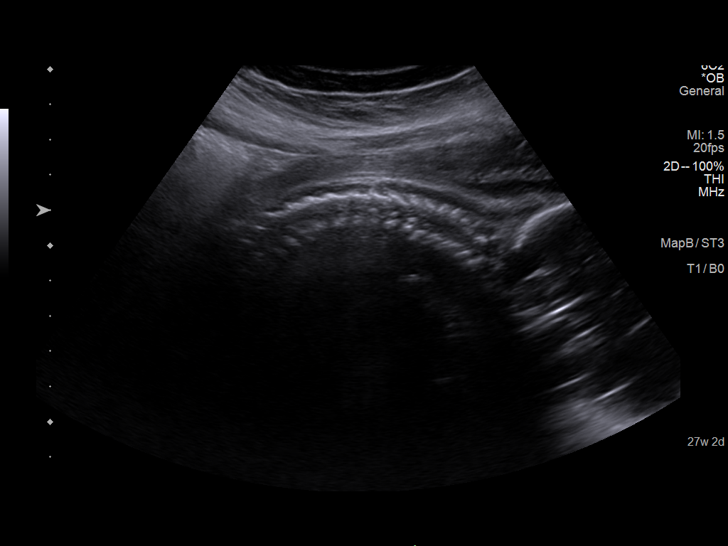
[im 72/81]
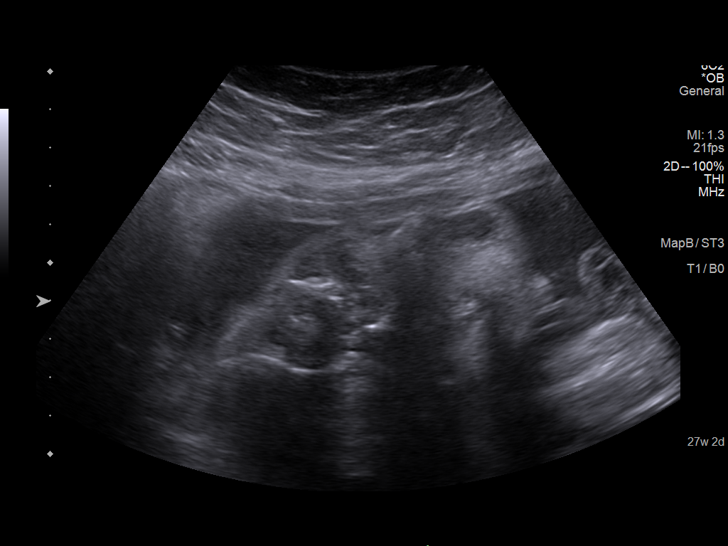
[im 78/81]
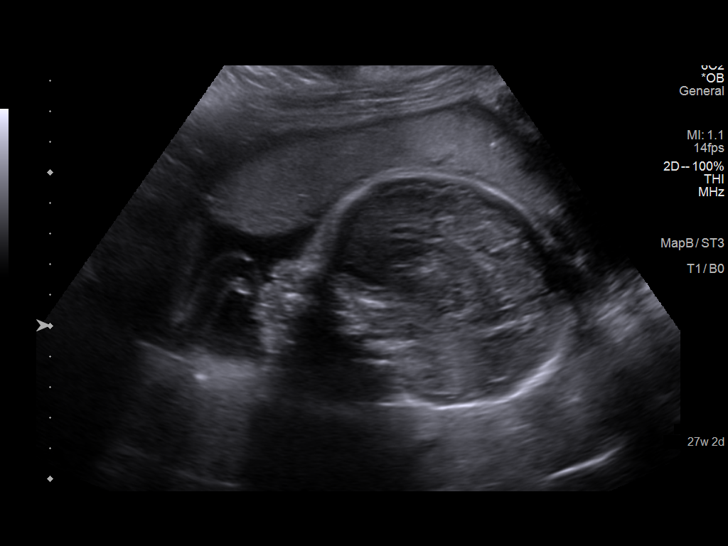

[13 of 28 positions shown; findings below may reference images not displayed]

FINDINGS: Number of Fetuses: 1

Heart Rate:  147 bpm

Movement: Yes

Presentation: Cephalic

Previa: Complete

Placental Location: Anterior left lateral

Amniotic Fluid (Subjective): Normal

Amniotic Fluid (Objective):

Vertical pocket = 2.95cm

FETAL BIOMETRY

BPD: 6.7cm 26w 6d

HC:   25.3cm 27w 3d

AC:   22.9cm 27w 2d

FL:   5.1cm 27w 3d

Current Mean GA: 27w 3d US EDC: 08/23/2021

Assigned GA:  27w 2d Assigned EDC: 08/24/2021

FETAL ANATOMY

Lateral Ventricles: Appears normal

Thalami/CSP: Appears normal

Posterior Fossa:  Previously seen

Nuchal Region: Previously seen   NFT= N/A > 20 WKS

Upper Lip: Appears normal

Spine: Limited views appear normal

4 Chamber Heart on Left: Appears normal

LVOT: Appears normal

RVOT: Previously seen

Stomach on Left: Appears normal

3 Vessel Cord: Appears normal

Cord Insertion site: Previously seen

Kidneys: Appears normal

Bladder: Appears normal

Extremities: Previously seen

Sex: Male

Technically difficult due to: Fetal position

Maternal Findings:

Cervix:  3.9 cm, closed
IMPRESSION: 1. Single live intrauterine pregnancy as above in cephalic
presentation. Estimated gestational age 27 weeks and 3 days.
2. Complete placenta previa.
3. No fetal anomalies identified. A complete anatomic survey was
performed previously.

## 2022-11-26 DIAGNOSIS — Z1389 Encounter for screening for other disorder: Secondary | ICD-10-CM | POA: Diagnosis not present

## 2022-11-26 DIAGNOSIS — Z0131 Encounter for examination of blood pressure with abnormal findings: Secondary | ICD-10-CM | POA: Diagnosis not present

## 2022-11-26 DIAGNOSIS — B3731 Acute candidiasis of vulva and vagina: Secondary | ICD-10-CM | POA: Diagnosis not present

## 2022-11-26 DIAGNOSIS — I10 Essential (primary) hypertension: Secondary | ICD-10-CM | POA: Diagnosis not present

## 2022-12-06 ENCOUNTER — Emergency Department: Payer: Medicaid Other

## 2022-12-06 ENCOUNTER — Emergency Department
Admission: EM | Admit: 2022-12-06 | Discharge: 2022-12-06 | Disposition: A | Discharge status: Home / Self Care | Payer: Medicaid Other | Attending: Emergency Medicine | Admitting: Emergency Medicine

## 2022-12-06 ENCOUNTER — Other Ambulatory Visit: Payer: Self-pay

## 2022-12-06 DIAGNOSIS — I10 Essential (primary) hypertension: Secondary | ICD-10-CM | POA: Insufficient documentation

## 2022-12-06 DIAGNOSIS — Z20822 Contact with and (suspected) exposure to covid-19: Secondary | ICD-10-CM | POA: Diagnosis not present

## 2022-12-06 DIAGNOSIS — R079 Chest pain, unspecified: Secondary | ICD-10-CM | POA: Diagnosis not present

## 2022-12-06 DIAGNOSIS — R0789 Other chest pain: Secondary | ICD-10-CM | POA: Diagnosis not present

## 2022-12-06 LAB — CBC
HCT: 42.7 % (ref 36.0–46.0)
Hemoglobin: 14.2 g/dL (ref 12.0–15.0)
MCH: 27.2 pg (ref 26.0–34.0)
MCHC: 33.3 g/dL (ref 30.0–36.0)
MCV: 81.6 fL (ref 80.0–100.0)
Platelets: 167 10*3/uL (ref 150–400)
RBC: 5.23 MIL/uL — ABNORMAL HIGH (ref 3.87–5.11)
RDW: 12.5 % (ref 11.5–15.5)
WBC: 7.7 10*3/uL (ref 4.0–10.5)
nRBC: 0 % (ref 0.0–0.2)

## 2022-12-06 LAB — BASIC METABOLIC PANEL
Anion gap: 10 (ref 5–15)
BUN: 7 mg/dL (ref 6–20)
CO2: 22 mmol/L (ref 22–32)
Calcium: 8.8 mg/dL — ABNORMAL LOW (ref 8.9–10.3)
Chloride: 104 mmol/L (ref 98–111)
Creatinine, Ser: 0.9 mg/dL (ref 0.44–1.00)
GFR, Estimated: >60 mL/min (ref >60)
Glucose, Bld: 109 mg/dL — ABNORMAL HIGH (ref 70–99)
Potassium: 3.3 mmol/L — ABNORMAL LOW (ref 3.5–5.1)
Sodium: 136 mmol/L (ref 135–145)

## 2022-12-06 LAB — TROPONIN I (HIGH SENSITIVITY): Troponin I (High Sensitivity): 4 ng/L (ref <18)

## 2022-12-06 LAB — POC URINE PREG, ED: Preg Test, Ur: NEGATIVE

## 2022-12-06 LAB — RESP PANEL BY RT-PCR (RSV, FLU A&B, COVID)  RVPGX2
Influenza A by PCR: NEGATIVE
Influenza B by PCR: NEGATIVE
Resp Syncytial Virus by PCR: NEGATIVE
SARS Coronavirus 2 by RT PCR: NEGATIVE

## 2022-12-06 NOTE — ED Triage Notes (Signed)
Pt c/o CP x 1 week that is worse with inspiration, states she's been exposed to covid. Pt recently quit smoking to see if it would improve s/s.

## 2022-12-06 NOTE — Discharge Instructions (Signed)
Your test today did not reveal any acute abnormalities.  Please return for any new, worsening, or change in symptoms or other concerns.  It was a pleasure caring for you today.

## 2022-12-06 NOTE — ED Provider Notes (Signed)
[facility] Provider Note    Event Date/Time   First MD Initiated Contact with Patient 12/06/22 1403     (approximate)   History   Chest Pain   HPI  Kari Morales is a 27 y.o. female with a past medical history of migraine headaches hypertension who presents today for evaluation of chest pain.  Patient reports that this has been intermittent for the past 3 weeks.  She reports that it happens randomly, sometimes when she is sitting, sometimes when she is walking.  She denies feeling short of breath.  She denies any pain with deep inspiration.  She has not had any nausea or diaphoresis.  She denies any radiation of pain into her back.  She reports that many people at work have COVID.  She initially attributed her symptoms to smoking, though stopped 3 days ago and has not noticed a difference.  She denies personal or family history of PE or DVT.  She denies any recent trips or travel.  She does not take any sort of hormones or oral contraceptive pills.  She denies any family history of heart attack before the age of 14.  Patient Active Problem List   Diagnosis Date Noted   Postpartum care following cesarean delivery 08/02/2021   Elevated blood pressure affecting pregnancy in third trimester, antepartum 07/30/2021   Complete placenta previa nos or without hemorrhage, third trimester 07/30/2021   Hypertension during pregnancy in third trimester, unspecified hypertension in pregnancy type 07/25/2021   Placenta previa 03/06/2021   Supervision of high risk pregnancy, antepartum 01/17/2021   UTI symptoms 09/13/2020   Migraine headache 12/25/2017          Physical Exam   Triage Vital Signs: ED Triage Vitals  Encounter Vitals Group     BP 12/06/22 1338 (!) 140/88     Systolic BP Percentile --      Diastolic BP Percentile --      Pulse Rate 12/06/22 1338 (!) 58     Resp 12/06/22 1338 17     Temp 12/06/22 1338 98.2 F (36.8 C)     Temp Source 12/06/22 1338  Oral     SpO2 12/06/22 1338 99 %     Weight --      Height --      Head Circumference --      Peak Flow --      Pain Score 12/06/22 1339 5     Pain Loc --      Pain Education --      Exclude from Growth Chart --     Most recent vital signs: Vitals:   12/06/22 1338  BP: (!) 140/88  Pulse: (!) 58  Resp: 17  Temp: 98.2 F (36.8 C)  SpO2: 99%    Physical Exam Vitals and nursing note reviewed.  Constitutional:      General: Awake and alert. No acute distress.    Appearance: Normal appearance. The patient is normal weight.  HENT:     Head: Normocephalic and atraumatic.     Mouth: Mucous membranes are moist.  Eyes:     General: PERRL. Normal EOMs        Right eye: No discharge.        Left eye: No discharge.     Conjunctiva/sclera: Conjunctivae normal.  Cardiovascular:     Rate and Rhythm: Normal rate and regular rhythm.     Pulses: Normal pulses.  Pulmonary:     Effort: Pulmonary effort is  normal. No respiratory distress.     Breath sounds: Normal breath sounds.  Abdominal:     Abdomen is soft. There is no abdominal tenderness. No rebound or guarding. No distention. Musculoskeletal:        General: No swelling. Normal range of motion.     Cervical back: Normal range of motion and neck supple.  Skin:    General: Skin is warm and dry.     Capillary Refill: Capillary refill takes less than 2 seconds.     Findings: No rash.  Neurological:     Mental Status: The patient is awake and alert.      ED Results / Procedures / Treatments   Labs (all labs ordered are listed, but only abnormal results are displayed) Labs Reviewed  BASIC METABOLIC PANEL - Abnormal; Notable for the following components:      Result Value   Potassium 3.3 (*)    Glucose, Bld 109 (*)    Calcium 8.8 (*)    All other components within normal limits  CBC - Abnormal; Notable for the following components:   RBC 5.23 (*)    All other components within normal limits  RESP PANEL BY RT-PCR (RSV,  FLU A&B, COVID)  RVPGX2  POC URINE PREG, ED  TROPONIN I (HIGH SENSITIVITY)     EKG     RADIOLOGY I independently reviewed and interpreted imaging and agree with radiologists findings.     PROCEDURES:  Critical Care performed:   Procedures   MEDICATIONS ORDERED IN ED: Medications - No data to display   IMPRESSION / MDM / ASSESSMENT AND PLAN / ED COURSE  I reviewed the triage vital signs and the nursing notes.   Differential diagnosis includes, but is not limited to, bronchospasm, lung irritation, acute coronary syndrome, pneumothorax, COVID-19.  Patient is awake and alert, hemodynamically stable and afebrile.  EKG obtained in triage is negative for any acute findings.  Chest x-ray obtained does not reveal any cardiopulmonary abnormality.  Labs obtained in triage are also reassuring, normal troponin.  No indication for repeat troponin given that her symptoms have been ongoing for several days.  She has no tachycardia, hypoxia, does not take any hormones/OCPs, no recent travel or periods of immobilization, is PERC negative and Wells score 0, do not suspect pulmonary embolism today.  I recommended that she continue to practice smoking cessation as she may have some lung irritation that may be contributing to her symptoms.  We discussed return precautions and outpatient follow-up.  Patient understands and agrees with plan.  Discharged in stable condition.   Patient's presentation is most consistent with acute complicated illness / injury requiring diagnostic workup.    FINAL CLINICAL IMPRESSION(S) / ED DIAGNOSES   Final diagnoses:  Nonspecific chest pain     Rx / DC Orders   ED Discharge Orders     None        Note:  This document was prepared using Dragon voice recognition software and may include unintentional dictation errors.   Keturah Shavers 12/06/22 Cephus Slater, MD 12/06/22 [phone]

## 2023-01-15 ENCOUNTER — Ambulatory Visit: Payer: Medicaid Other | Admitting: Licensed Practical Nurse

## 2023-01-21 ENCOUNTER — Ambulatory Visit: Payer: Medicaid Other

## 2023-01-21 ENCOUNTER — Telehealth: Payer: Self-pay

## 2023-01-21 NOTE — Telephone Encounter (Signed)
Patient requesting to go back on metformin to manage her PCOS symptoms. Chart reviewed. Last visit 08/2021 for PPC. Advised needs appointment. Patient has cancelled appt that was scheduled for today as well as on 01/15/23. Patient advised front desk will contact her for scheduling of annual exam needed.

## 2023-01-22 NOTE — Telephone Encounter (Signed)
Pt is scheduled with LMD on 02/02/2023 at 1:35.

## 2023-02-02 ENCOUNTER — Ambulatory Visit: Payer: Medicaid Other | Admitting: Licensed Practical Nurse

## 2023-02-02 ENCOUNTER — Encounter: Payer: Self-pay | Admitting: Licensed Practical Nurse

## 2023-02-02 VITALS — BP 131/110 | HR 68 | Ht 64.0 in | Wt 183.1 lb

## 2023-02-02 DIAGNOSIS — Z3202 Encounter for pregnancy test, result negative: Secondary | ICD-10-CM

## 2023-02-02 DIAGNOSIS — Z01419 Encounter for gynecological examination (general) (routine) without abnormal findings: Secondary | ICD-10-CM | POA: Diagnosis not present

## 2023-02-02 DIAGNOSIS — Z131 Encounter for screening for diabetes mellitus: Secondary | ICD-10-CM

## 2023-02-02 DIAGNOSIS — Z13220 Encounter for screening for lipoid disorders: Secondary | ICD-10-CM

## 2023-02-02 DIAGNOSIS — N926 Irregular menstruation, unspecified: Secondary | ICD-10-CM

## 2023-02-02 DIAGNOSIS — E282 Polycystic ovarian syndrome: Secondary | ICD-10-CM

## 2023-02-02 LAB — POCT URINE PREGNANCY: Preg Test, Ur: NEGATIVE

## 2023-02-02 NOTE — Progress Notes (Unsigned)
Gynecology Annual Exam   PCP: Antonieta Loveless, MD  Chief Complaint: She would like to go back on metformin, she has PCOS, metformin helped her to lose weigh ti the past. She is struggling to lose weight.  She currently is not using contraception, she needed Clomid to become pregnant, she does not desire another pregnant for another 2+years   Elevated BP: has CHTN did not take her medication today   History of Present Illness: Patient is a 27 y.o. G1P0101 presents for annual exam. The patient has no complaints today.   LMP: No LMP recorded. Average Interval: regular,  28-30  days Duration of flow:  5-7  days Heavy Menses: yes, needs to wear a depends for the first 4-5 days, changes the depends every time she goes to the [place].  Clots: no Intermenstrual Bleeding: no Postcoital Bleeding: no Dysmenorrhea: occasional  The patient is sexually active with 1 female. She currently uses none for contraception (hormones "bother me"). She denies dyspareunia.  The patient does perform self breast exams.  There is no notable family history of breast or ovarian cancer in her family.  The patient wears seatbelts: yes.   The patient has regular exercise: no  The patient reports current symptoms of depression.  Has been diagnosed with PTSD and anxiety and depression, was on medication but it made her feel drowsy so she stopped taking it after about 1.5 months of use. 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 She intends to go to MA school. Their 1 year has some speech delays. They are navigating parenting a child that could have other needs-her pediatrician has suggested Autism.    Review of Systems: ROS see HPI  Past Medical History:  Patient Active Problem  List   Diagnosis Date Noted Date Diagnosed   UTI symptoms 09/13/2020     09/13/2020    Migraine headache 12/25/2017     Past Surgical History:  Past Surgical History:  Procedure Laterality Date   CESAREAN SECTION N/A 07/30/2021   Procedure: CESAREAN SECTION;  Surgeon: [name], MD;  Location: [facility] ORS;  Service: Obstetrics;  Laterality: N/A;   DENTAL SURGERY     ganglian cyst removal in left hand     ~2007 or 2008 per pt   GANGLION CYST EXCISION Left    x2    Gynecologic History:  No LMP recorded. Contraception: none Last Pap: Results were: no abnormalities 2022  Obstetric History: G1P0101  Family History:  Family History  Problem Relation Age of Onset   Hypertension Mother    Cancer Paternal Grandfather    Prostate cancer Paternal Grandfather    Breast cancer Half-Sister     Social History:  Social History   Socioeconomic History   Marital status: Single    Spouse name: Raheen   Number of children: Not on file   Years of education: Not on file   Highest education level: Not on file  Occupational History   Not on file  Tobacco Use   Smoking status: Former    Types: E-cigarettes, Cigars    Quit date: 09/19/2020    Years since quitting: 2.3   Smokeless tobacco: Never  Vaping Use   Vaping  status: Former   Start date: 09/03/2019  Substance and Sexual Activity   Alcohol use: Never   Drug use: Never   Sexual activity: Yes    Birth control/protection: None  Other Topics Concern   Not on file  Social History Narrative   Not on file   Social Determinants of Health   Financial Resource Strain: Not on file  Food Insecurity: Not on file  Transportation Needs: Not on file  Physical Activity: Not on file  Stress: Not on file  Social Connections: Not on file  Intimate Partner Violence: Not At Risk (10/21/2019)   Humiliation, Afraid, Rape, and Kick questionnaire    Fear of Current or Ex-Partner: No    Emotionally Abused: No    Physically Abused: No     Sexually Abused: No    Allergies:  Allergies  Allergen Reactions   Shrimp [Shellfish 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 Authorizing Provider  lidocaine (LIDODERM) 5 % Place 1 patch onto the skin every 12 (twelve) hours. Remove & Discard patch within 12 hours or as directed by MD 10/09/22 10/09/23 Yes Romeo Apple, Myah A, PA-C  acetaminophen (TYLENOL) 500 MG tablet Take 500 mg by mouth every 6 (six) hours as needed. Patient not taking: Reported on 09/18/2021    [provider]  calcium carbonate (TUMS EX) 750 MG chewable tablet Chew 2 tablets by mouth daily. Patient not taking: Reported on 08/12/2021    [provider]  diphenhydrAMINE (BENADRYL) 25 MG tablet Take 50 mg by mouth every 6 (six) hours as needed for itching. Patient not taking: Reported on 08/12/2021    [provider]  ketorolac (TORADOL) 10 MG tablet Take 1 tablet (10 mg total) by mouth every 8 (eight) hours as needed. Patient not taking: Reported on 02/02/2023 10/09/22   Kern Reap A, PA-C  NIFEdipine (PROCARDIA XL) 60 MG 24 hr tablet Take 1 tablet (60 mg total) by mouth daily. Patient not taking: Reported on 09/18/2021 08/02/21 08/02/22  Mirna Mires, CNM  Norethindrone Acetate-Ethinyl Estrad-FE (LOESTRIN 24 FE) 1-20 MG-MCG(24) tablet Take 1 tablet by mouth daily. Patient not taking: Reported on 02/02/2023 09/18/21   Constant, Peggy, MD  oxyCODONE (OXY IR/ROXICODONE) 5 MG immediate release tablet Take 1-2 tablets (5-10 mg total) by mouth every 4 (four) hours as needed for moderate pain. Patient not taking: Reported on 08/12/2021 08/02/21   Mirna Mires, CNM  Prenat w/o A Vit-FeFum-FePo-FA (CONCEPT OB) 130-92.4-1 MG CAPS Take 1 capsule by mouth daily. Patient not taking: Reported on 09/18/2021 01/28/21   Nadara Mustard, MD    Physical Exam Vitals: Blood pressure (!) 147/101, pulse 77, height 5\' 4"  (1.626 m),  weight 183 lb 1.6 oz (83.1 kg), not currently breastfeeding.  General: NAD HEENT: normocephalic, anicteric Thyroid: no enlargement, no palpable nodules Pulmonary: No increased work of breathing, CTAB Cardiovascular: RRR, distal pulses 2+ Breast: Breast symmetrical, no tenderness, no palpable nodules or masses, no skin or nipple retraction present, no nipple discharge.  No axillary or supraclavicular lymphadenopathy. Abdomen: NABS, soft, non-tender, non-distended.  Umbilicus without lesions.  No hepatomegaly, splenomegaly or masses palpable. No evidence of hernia  Genitourinary:  External: Normal external female genitalia.  Normal urethral meatus, normal Bartholin's and Skene's glands.    Vagina: Normal vaginal mucosa, no evidence of prolapse.    Cervix: Grossly normal in appearance, no bleeding  Uterus: Non-enlarged, mobile, normal contour.  No CMT  Adnexa: ovaries  non-enlarged, no adnexal masses  Rectal: deferred  Lymphatic: no evidence of inguinal lymphadenopathy Extremities: no edema, erythema, or tenderness Neurologic: Grossly intact Psychiatric: mood appropriate, affect full   Assessment: 27 y.o. G1P0101 routine annual exam  Plan: Problem List Items Addressed This Visit   None Visit Diagnoses     Well woman exam    -  Primary   Relevant Orders   Hemoglobin A1c   TSH + free T4   CBC   Lipid panel   Comprehensive metabolic panel   Missed period       Relevant Orders   POCT urine pregnancy (Completed)   Screening for diabetes mellitus       Relevant Orders   Hemoglobin A1c   Screening for cholesterol level       Relevant Orders   Lipid panel       2) STI screening  wasoffered and declined  2)  ASCCP guidelines and rational discussed.  Patient opts for every 3 years screening interval Due in 2025   3) Contraception - the patient is currently using  none.  She does not feel the need for contraception, but understands metformin and weight loss could influence her  cycle and she could become pregnant, may consider the IUD for contraception and cycle control.   4) Routine healthcare maintenance including cholesterol, diabetes screening discussed Ordered today  5) Elevated BP: instructed to take medication once she is home and to fu with her PCP  6) depression: rec trying to get out daily (pt has noticed a change in her mood when she is abel to get outside) and to contact therapist at [facility], together the can determine if she needs to restart medication. Rec mindfulness activities.   7) PCOS: HA1C collected, will be in touch with restarting metformin.   Carie Caddy, CNM   [facility] 02/02/2023, 3:40 PM

## 2023-02-03 LAB — CBC
Hematocrit: 45.3 % (ref 34.0–46.6)
Hemoglobin: 14.9 g/dL (ref 11.1–15.9)
MCH: 27.4 pg (ref 26.6–33.0)
MCHC: 32.9 g/dL (ref 31.5–35.7)
MCV: 83 fL (ref 79–97)
Platelets: 186 10*3/uL (ref 150–450)
RBC: 5.44 x10E6/uL — ABNORMAL HIGH (ref 3.77–5.28)
RDW: 13.4 % (ref 11.7–15.4)
WBC: 8 10*3/uL (ref 3.4–10.8)

## 2023-02-03 LAB — TSH+FREE T4
Free T4: 1.02 ng/dL (ref 0.82–1.77)
TSH: 1.65 u[IU]/mL (ref 0.450–4.500)

## 2023-02-03 LAB — COMPREHENSIVE METABOLIC PANEL
ALT: 14 [IU]/L (ref 0–32)
AST: 18 [IU]/L (ref 0–40)
Albumin: 4.4 g/dL (ref 4.0–5.0)
Alkaline Phosphatase: 82 [IU]/L (ref 44–121)
BUN/Creatinine Ratio: 10 (ref 9–23)
BUN: 10 mg/dL (ref 6–20)
Bilirubin Total: 1.3 mg/dL — ABNORMAL HIGH (ref 0.0–1.2)
CO2: 20 mmol/L (ref 20–29)
Calcium: 9.9 mg/dL (ref 8.7–10.2)
Chloride: 103 mmol/L (ref 96–106)
Creatinine, Ser: 1.03 mg/dL — ABNORMAL HIGH (ref 0.57–1.00)
Globulin, Total: 2.9 g/dL (ref 1.5–4.5)
Glucose: 80 mg/dL (ref 70–99)
Potassium: 3.9 mmol/L (ref 3.5–5.2)
Sodium: 138 mmol/L (ref 134–144)
Total Protein: 7.3 g/dL (ref 6.0–8.5)
eGFR: 76 mL/min/{1.73_m2} (ref >59)

## 2023-02-03 LAB — HEMOGLOBIN A1C
Est. average glucose Bld gHb Est-mCnc: 105 mg/dL
Hgb A1c MFr Bld: 5.3 % (ref 4.8–5.6)

## 2023-02-03 LAB — LIPID PANEL
Chol/HDL Ratio: 4.5 {ratio} — ABNORMAL HIGH (ref 0.0–4.4)
Cholesterol, Total: 212 mg/dL — ABNORMAL HIGH (ref 100–199)
HDL: 47 mg/dL (ref >39)
LDL Chol Calc (NIH): 145 mg/dL — ABNORMAL HIGH (ref 0–99)
Triglycerides: 110 mg/dL (ref 0–149)
VLDL Cholesterol Cal: 20 mg/dL (ref 5–40)

## 2023-02-06 ENCOUNTER — Other Ambulatory Visit: Payer: Self-pay | Admitting: Licensed Practical Nurse

## 2023-02-06 DIAGNOSIS — E282 Polycystic ovarian syndrome: Secondary | ICD-10-CM

## 2023-02-06 MED ORDER — METFORMIN HCL 500 MG PO TABS: ORAL_TABLET | ORAL | 5 refills | Status: DC

## 2023-02-06 NOTE — Progress Notes (Signed)
Pt seen for annual exam, pt requested restarting Metformin has a hx of PCOS, was able to lose weight while on metformin.  Reviewed with Dr Logan Bores, ok to start Metformin.  Script sent to pharmacy.  Mychart message sent to pt  Kari Morales   [facility]  02/06/23  5:12 PM

## 2023-02-13 DIAGNOSIS — I10 Essential (primary) hypertension: Secondary | ICD-10-CM | POA: Diagnosis not present

## 2023-02-13 DIAGNOSIS — E119 Type 2 diabetes mellitus without complications: Secondary | ICD-10-CM | POA: Diagnosis not present

## 2023-02-13 DIAGNOSIS — E282 Polycystic ovarian syndrome: Secondary | ICD-10-CM | POA: Diagnosis not present

## 2023-03-09 DIAGNOSIS — Z0130 Encounter for examination of blood pressure without abnormal findings: Secondary | ICD-10-CM | POA: Diagnosis not present

## 2023-03-09 DIAGNOSIS — R21 Rash and other nonspecific skin eruption: Secondary | ICD-10-CM | POA: Diagnosis not present

## 2023-03-09 DIAGNOSIS — Z1389 Encounter for screening for other disorder: Secondary | ICD-10-CM | POA: Diagnosis not present

## 2023-03-09 DIAGNOSIS — Z0131 Encounter for examination of blood pressure with abnormal findings: Secondary | ICD-10-CM | POA: Diagnosis not present

## 2023-05-27 DIAGNOSIS — R079 Chest pain, unspecified: Secondary | ICD-10-CM | POA: Diagnosis not present

## 2023-05-27 DIAGNOSIS — R0789 Other chest pain: Secondary | ICD-10-CM | POA: Diagnosis not present

## 2023-06-16 ENCOUNTER — Other Ambulatory Visit (HOSPITAL_COMMUNITY)
Admission: RE | Admit: 2023-06-16 | Discharge: 2023-06-16 | Disposition: A | Discharge status: Home / Self Care | Payer: Medicaid Other | Source: Ambulatory Visit | Attending: Obstetrics | Admitting: Obstetrics

## 2023-06-16 ENCOUNTER — Ambulatory Visit (INDEPENDENT_AMBULATORY_CARE_PROVIDER_SITE_OTHER): Payer: Medicaid Other

## 2023-06-16 VITALS — BP 143/110 | HR 96 | Wt 187.8 lb

## 2023-06-16 DIAGNOSIS — Z3200 Encounter for pregnancy test, result unknown: Secondary | ICD-10-CM

## 2023-06-16 DIAGNOSIS — N898 Other specified noninflammatory disorders of vagina: Secondary | ICD-10-CM | POA: Diagnosis not present

## 2023-06-16 DIAGNOSIS — Z3202 Encounter for pregnancy test, result negative: Secondary | ICD-10-CM | POA: Diagnosis not present

## 2023-06-16 DIAGNOSIS — Z113 Encounter for screening for infections with a predominantly sexual mode of transmission: Secondary | ICD-10-CM

## 2023-06-16 LAB — POCT URINE PREGNANCY: Preg Test, Ur: NEGATIVE

## 2023-06-16 NOTE — Progress Notes (Addendum)
    NURSE VISIT NOTE  Subjective:    Patient ID: Kari Morales, female    DOB: 1995/10/18, 28 y.o.   MRN: [id number]  HPI  Patient is a 28 y.o. G44P0101 female who presents for clear vaginal discharge for 2 week(s) or longer Denies abnormal vaginal bleeding or significant pelvic pain or fever. denies . Patient denies history of known exposure to STD. Also 22 days behind on period test was performed. Labs performed as well today    Objective:    BP (!) 130/98 (BP Location: Left Arm, Patient Position: Sitting, Cuff Size: Normal)   Pulse 95     Assessment:   1. Possible pregnancy   2. Screening for STD (sexually transmitted disease)       Plan:   Pregnancy test: Negative GC and chlamydia DNA  probe sent to lab. ROV prn if symptoms persist or worsen.   Burtis Junes, CMA

## 2023-06-17 ENCOUNTER — Telehealth: Payer: Self-pay

## 2023-06-17 ENCOUNTER — Encounter: Payer: Self-pay | Admitting: Obstetrics

## 2023-06-17 LAB — CERVICOVAGINAL ANCILLARY ONLY
Bacterial Vaginitis (gardnerella): POSITIVE — AB
Candida Glabrata: NEGATIVE
Candida Vaginitis: NEGATIVE
Chlamydia: NEGATIVE
Comment: NEGATIVE
Comment: NEGATIVE
Comment: NEGATIVE
Comment: NEGATIVE
Comment: NEGATIVE
Comment: NORMAL
Neisseria Gonorrhea: NEGATIVE
Trichomonas: NEGATIVE

## 2023-06-17 LAB — HEP, RPR, HIV PANEL
HIV Screen 4th Generation wRfx: NONREACTIVE
Hepatitis B Surface Ag: NEGATIVE
RPR Ser Ql: NONREACTIVE

## 2023-06-17 LAB — HSV 1 AND 2 AB, IGG
HSV 1 Glycoprotein G Ab, IgG: NONREACTIVE
HSV 2 IgG, Type Spec: REACTIVE — AB

## 2023-06-17 NOTE — Telephone Encounter (Signed)
 Patient has questions about HSV 2. Questions answered. Information sent via my chart.

## 2023-06-18 DIAGNOSIS — Z0131 Encounter for examination of blood pressure with abnormal findings: Secondary | ICD-10-CM | POA: Diagnosis not present

## 2023-06-18 DIAGNOSIS — N760 Acute vaginitis: Secondary | ICD-10-CM | POA: Diagnosis not present

## 2023-06-18 DIAGNOSIS — Z1389 Encounter for screening for other disorder: Secondary | ICD-10-CM | POA: Diagnosis not present

## 2023-06-18 DIAGNOSIS — F411 Generalized anxiety disorder: Secondary | ICD-10-CM | POA: Diagnosis not present

## 2023-06-18 DIAGNOSIS — Z113 Encounter for screening for infections with a predominantly sexual mode of transmission: Secondary | ICD-10-CM | POA: Diagnosis not present

## 2023-07-17 ENCOUNTER — Ambulatory Visit: Payer: Medicaid Other | Admitting: Licensed Practical Nurse

## 2023-07-18 ENCOUNTER — Ambulatory Visit
Admission: EM | Admit: 2023-07-18 | Discharge: 2023-07-18 | Disposition: A | Discharge status: Home / Self Care | Attending: Emergency Medicine | Admitting: Emergency Medicine

## 2023-07-18 ENCOUNTER — Other Ambulatory Visit: Payer: Self-pay

## 2023-07-18 ENCOUNTER — Encounter: Payer: Self-pay | Admitting: Emergency Medicine

## 2023-07-18 DIAGNOSIS — K6289 Other specified diseases of anus and rectum: Secondary | ICD-10-CM | POA: Insufficient documentation

## 2023-07-18 DIAGNOSIS — N898 Other specified noninflammatory disorders of vagina: Secondary | ICD-10-CM | POA: Diagnosis not present

## 2023-07-18 MED ORDER — FLUCONAZOLE 150 MG PO TABS: 150.0000 mg | ORAL_TABLET | ORAL | 0 refills | Status: AC

## 2023-07-18 NOTE — ED Triage Notes (Signed)
 Patient presents to [facility] for evaluation of irritation to the vaginal/rectal area x 4-5 days.  Recently diagnosed with herpes, and concerned this might be an outbreak.

## 2023-07-18 NOTE — Discharge Instructions (Addendum)
 Evaluated for the irritation to your genitalia, on exam able to see the irritation but this is not consistent with an herpetic lesion which is a blister, able to see Kari Morales vaginal discharge which may be the cause of your symptoms  Vaginal swab checking for yeast and bacterial vaginosis, pending, you will be notified of positive test results only  Today you are being treated prophylactically for yeast  Take 1 Diflucan tab when you receive your medicine then take second dose in 3 days if still having symptoms  Continue normal vaginal hygiene such as avoiding scented products, wiping from front to back, urinating and freshening the body after sexual intercourse etc.

## 2023-07-19 NOTE — ED Provider Notes (Signed)
 Renaldo Fiddler    CSN: [id number] Arrival date & time: 07/18/23  1419      History   Chief Complaint Chief Complaint  Patient presents with   Vaginal Itching    HPI Kari Morales is a 28 y.o. female.   Patient presents for evaluation of anal and vaginal irritation with mild itching present for 3 to 4 days.  Has noticed a small amount of clear to Kari Morales discharge occurring intermittently.  Denies abdominal pain, back pain, odor, urinary symptoms.  Last menstrual period 07/07/2023.  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  Past Medical History:  Diagnosis Date   Hypertension    history   Migraine    PCOS (polycystic ovarian syndrome)     Patient Active Problem List   Diagnosis Date Noted   UTI symptoms 09/13/2020   Migraine headache 12/25/2017    Past Surgical History:  Procedure Laterality Date   CESAREAN SECTION N/A 07/30/2021   Procedure: CESAREAN SECTION;  Surgeon: [name], MD;  Location: [facility] ORS;  Service: Obstetrics;  Laterality: N/A;   DENTAL SURGERY     ganglian cyst removal in left hand     ~2007 or 2008 per pt   GANGLION CYST EXCISION Left    x2    OB History     Gravida  1   Para  1   Term      Preterm  1   AB      Living  1      SAB      IAB      Ectopic      Multiple  0   Live Births  1            Home Medications    Prior to Admission medications   Medication Sig Start Date End Date Taking? Authorizing Provider  fluconazole (DIFLUCAN) 150 MG tablet Take 1 tablet (150 mg total) by mouth every 3 (three) days for 2 doses. 07/18/23 07/22/23 Yes Arpan Eskelson, Elita Boone, NP  acetaminophen (TYLENOL) 500 MG tablet Take 500 mg by mouth every 6 (six) hours as needed. Patient not taking: Reported on 09/18/2021    [provider]  calcium carbonate (TUMS EX) 750 MG chewable tablet Chew 2 tablets by mouth daily. Patient not taking:  Reported on 08/12/2021    [provider]  diphenhydrAMINE (BENADRYL) 25 MG tablet Take 50 mg by mouth every 6 (six) hours as needed for itching. Patient not taking: Reported on 08/12/2021    [provider]  ketorolac (TORADOL) 10 MG tablet Take 1 tablet (10 mg total) by mouth every 8 (eight) hours as needed. Patient not taking: Reported on 02/02/2023 10/09/22   Kern Reap A, PA-C  lidocaine (LIDODERM) 5 % Place 1 patch onto the skin every 12 (twelve) hours. Remove & Discard patch within 12 hours or as directed by MD 10/09/22 10/09/23  Romeo Apple, Myah A, PA-C  metFORMIN (GLUCOPHAGE) 500 MG tablet Take one tablet by mouth daily for one week. Then increase to one tablet twice a day for one week.  Then two tablets twice a day. 02/06/23   Dominic, Courtney Heys, CNM  NIFEdipine (PROCARDIA XL) 60 MG 24 hr tablet Take 1 tablet (60 mg total) by mouth daily. Patient not taking: Reported on 09/18/2021 08/02/21 08/02/22  Mirna Mires, CNM  Norethindrone Acetate-Ethinyl Estrad-FE (LOESTRIN 24 FE) 1-20 MG-MCG(24) tablet Take 1  tablet by mouth daily. Patient not taking: Reported on 02/02/2023 09/18/21   Constant, Peggy, MD  oxyCODONE (OXY IR/ROXICODONE) 5 MG immediate release tablet Take 1-2 tablets (5-10 mg total) by mouth every 4 (four) hours as needed for moderate pain. Patient not taking: Reported on 08/12/2021 08/02/21   Mirna Mires, CNM  Prenat w/o A Vit-FeFum-FePo-FA (CONCEPT OB) 130-92.4-1 MG CAPS Take 1 capsule by mouth daily. Patient not taking: Reported on 09/18/2021 01/28/21   Nadara Mustard, MD    Family History Family History  Problem Relation Age of Onset   Hypertension Mother    Cancer Paternal Grandfather    Prostate cancer Paternal Grandfather    Breast cancer Half-Sister     Social History Social History   Tobacco Use   Smoking status: Former    Types: E-cigarettes, Cigars    Quit date: 09/19/2020    Years since quitting: 2.8   Smokeless tobacco: Never   Vaping Use   Vaping status: Former   Start date: 09/03/2019  Substance Use Topics   Alcohol use: Never   Drug use: Never     Allergies   Shrimp [shellfish allergy]   Review of Systems Review of Systems   Physical Exam Triage Vital Signs ED Triage Vitals  Encounter Vitals Group     BP 07/18/23 1447 (!) 140/104     Systolic BP Percentile --      Diastolic BP Percentile --      Pulse Rate 07/18/23 1447 (!) 102     Resp 07/18/23 1447 18     Temp 07/18/23 1447 98 F (36.7 C)     Temp Source 07/18/23 1447 Temporal     SpO2 07/18/23 1447 96 %     Weight --      Height --      Head Circumference --      Peak Flow --      Pain Score 07/18/23 1448 4     Pain Loc --      Pain Education --      Exclude from Growth Chart --    No data found.  Updated Vital Signs BP (!) 140/104 (BP Location: Left Arm)   Pulse (!) 102   Temp 98 F (36.7 C) (Temporal)   Resp 18   LMP 07/07/2023 (Exact Date)   SpO2 96%   Visual Acuity Right Eye Distance:   Left Eye Distance:   Bilateral Distance:    Right Eye Near:   Left Eye Near:    Bilateral Near:     Physical Exam Constitutional:      Appearance: Normal appearance.  Eyes:     Extraocular Movements: Extraocular movements intact.  Pulmonary:     Effort: Pulmonary effort is normal.  Genitourinary:    Comments: Kari Morales thin vaginal discharge without odor present to the opening of the vaginal canal, flesh tone papules present scantly over the labia and anus Neurological:     Mental Status: She is alert.      UC Treatments / Results  Labs (all labs ordered are listed, but only abnormal results are displayed) Labs Reviewed  CERVICOVAGINAL ANCILLARY ONLY    EKG   Radiology No results found.  Procedures Procedures (including critical care time)  Medications Ordered in UC Medications - No data to display  Initial Impression / Assessment and Plan / UC Course  I have reviewed the triage vital signs and the nursing  notes.  Pertinent labs & imaging results that were available  during my care of the patient were reviewed by me and considered in my medical decision making (see chart for details).  Anal irritation, vaginal irritation, vaginal discharge  Presentation not consistent with herpetic lesion, discussed with patient, irritation most likely due to discharge, vaginal swab checking for yeast and BV pending, declined full STD panel recently completed, prophylactically treating for yeast, prescribed Diflucan and recommended additional supportive care with follow-up as needed, given information to behavioral health clinic for anxiety  Final Clinical Impressions(s) / UC Diagnoses   Final diagnoses:  Anal irritation  Vaginal irritation  Vaginal discharge     Discharge Instructions      Evaluated for the irritation to your genitalia, on exam able to see the irritation but this is not consistent with an herpetic lesion which is a blister, able to see Analysse Quinonez vaginal discharge which may be the cause of your symptoms  Vaginal swab checking for yeast and bacterial vaginosis, pending, you will be notified of positive test results only  Today you are being treated prophylactically for yeast  Take 1 Diflucan tab when you receive your medicine then take second dose in 3 days if still having symptoms  Continue normal vaginal hygiene such as avoiding scented products, wiping from front to back, urinating and freshening the body after sexual intercourse etc.     ED Prescriptions     Medication Sig Dispense Auth. Provider   fluconazole (DIFLUCAN) 150 MG tablet Take 1 tablet (150 mg total) by mouth every 3 (three) days for 2 doses. 2 tablet Kari Hoar, NP      PDMP not reviewed this encounter.   Kari Morales, [place] 07/19/23 [phone]

## 2023-07-20 LAB — CERVICOVAGINAL ANCILLARY ONLY
Bacterial Vaginitis (gardnerella): NEGATIVE
Candida Glabrata: NEGATIVE
Candida Vaginitis: POSITIVE — AB
Comment: NEGATIVE
Comment: NEGATIVE
Comment: NEGATIVE

## 2023-09-28 ENCOUNTER — Ambulatory Visit
Admission: EM | Admit: 2023-09-28 | Discharge: 2023-09-28 | Disposition: A | Discharge status: Home / Self Care | Attending: Emergency Medicine | Admitting: Emergency Medicine

## 2023-09-28 DIAGNOSIS — R1030 Lower abdominal pain, unspecified: Secondary | ICD-10-CM | POA: Insufficient documentation

## 2023-09-28 DIAGNOSIS — N898 Other specified noninflammatory disorders of vagina: Secondary | ICD-10-CM | POA: Diagnosis not present

## 2023-09-28 LAB — POCT URINALYSIS DIP (MANUAL ENTRY)
Bilirubin, UA: NEGATIVE
Blood, UA: NEGATIVE
Glucose, UA: NEGATIVE mg/dL
Ketones, POC UA: NEGATIVE mg/dL
Nitrite, UA: NEGATIVE
Protein Ur, POC: NEGATIVE mg/dL
Spec Grav, UA: 1.025
Urobilinogen, UA: 0.2 U/dL
pH, UA: 6

## 2023-09-28 LAB — POCT URINE PREGNANCY: Preg Test, Ur: NEGATIVE

## 2023-09-28 NOTE — ED Triage Notes (Signed)
 Pt reports she has vaginal irritation, white discharge, and low abdominal pain since this morning.   Needs work note

## 2023-09-28 NOTE — Discharge Instructions (Addendum)
 Your vaginal tests are pending.  If your test results are positive, we will call you.  Do not have sexual activity until all test results are back and treatment is completed if needed.    Follow-up with your primary care provider if your symptoms are not improving.

## 2023-09-28 NOTE — ED Provider Notes (Signed)
 Kari Morales    CSN: [id number] Arrival date & time: 09/28/23  1526      History   Chief Complaint Chief Complaint  Patient presents with   Vaginal Itching    HPI Kari Morales is a 28 y.o. female.  Patient presents with vaginal discharge, vaginal irritation, lower abdominal pain since this morning.  No fever, nausea, vomiting, diarrhea, constipation, pelvic pain, dysuria, hematuria.  No OTC medications taken today.  The history is provided by the patient and medical records.    Past Medical History:  Diagnosis Date   Hypertension    history   Migraine    PCOS (polycystic ovarian syndrome)     Patient Active Problem List   Diagnosis Date Noted   UTI symptoms 09/13/2020   Migraine headache 12/25/2017    Past Surgical History:  Procedure Laterality Date   CESAREAN SECTION N/A 07/30/2021   Procedure: CESAREAN SECTION;  Surgeon: Heron Lord, MD;  Location: [facility] ORS;  Service: Obstetrics;  Laterality: N/A;   DENTAL SURGERY     ganglian cyst removal in left hand     ~2007 or 2008 per pt   GANGLION CYST EXCISION Left    x2    OB History     Gravida  1   Para  1   Term      Preterm  1   AB      Living  1      SAB      IAB      Ectopic      Multiple  0   Live Births  1            Home Medications    Prior to Admission medications   Medication Sig Start Date End Date Taking? Authorizing Provider  acetaminophen  (TYLENOL ) 500 MG tablet Take 500 mg by mouth every 6 (six) hours as needed. Patient not taking: Reported on 09/18/2021    [provider]  calcium  carbonate (TUMS EX) 750 MG chewable tablet Chew 2 tablets by mouth daily. Patient not taking: Reported on 08/12/2021    [provider]  diphenhydrAMINE  (BENADRYL ) 25 MG tablet Take 50 mg by mouth every 6 (six) hours as needed for itching. Patient not taking: Reported on 08/12/2021    [provider]  ketorolac  (TORADOL ) 10 MG tablet Take 1 tablet  (10 mg total) by mouth every 8 (eight) hours as needed. Patient not taking: Reported on 02/02/2023 10/09/22   Alvaro Augusta A, PA-C  lidocaine  (LIDODERM ) 5 % Place 1 patch onto the skin every 12 (twelve) hours. Remove & Discard patch within 12 hours or as directed by MD 10/09/22 10/09/23  Alvaro Augusta A, PA-C  metFORMIN  (GLUCOPHAGE ) 500 MG tablet Take one tablet by mouth daily for one week. Then increase to one tablet twice a day for one week.  Then two tablets twice a day. 02/06/23   Dominic, Alva Jewels, CNM  NIFEdipine  (PROCARDIA  XL) 60 MG 24 hr tablet Take 1 tablet (60 mg total) by mouth daily. Patient not taking: Reported on 09/18/2021 08/02/21 08/02/22  Alicia Apa, CNM  Norethindrone Acetate-Ethinyl Estrad-FE (LOESTRIN 24 FE) 1-20 MG-MCG(24) tablet Take 1 tablet by mouth daily. Patient not taking: Reported on 02/02/2023 09/18/21   Constant, Peggy, MD  oxyCODONE  (OXY IR/ROXICODONE ) 5 MG immediate release tablet Take 1-2 tablets (5-10 mg total) by mouth every 4 (four) hours as needed for moderate pain. Patient not taking: Reported on 08/12/2021 08/02/21   Marti Slates  M, CNM  Prenat w/o A Vit-FeFum-FePo-FA (CONCEPT OB ) 130-92.4-1 MG CAPS Take 1 capsule by mouth daily. Patient not taking: Reported on 09/18/2021 01/28/21   Alben Alma, MD    Family History Family History  Problem Relation Age of Onset   Hypertension Mother    Cancer Paternal Grandfather    Prostate cancer Paternal Grandfather    Breast cancer Half-Sister     Social History Social History   Tobacco Use   Smoking status: Former    Types: E-cigarettes, Cigars    Quit date: 09/19/2020    Years since quitting: 3.0   Smokeless tobacco: Never  Vaping Use   Vaping status: Former   Start date: 09/03/2019  Substance Use Topics   Alcohol use: Never   Drug use: Never     Allergies   Shrimp [shellfish allergy]   Review of Systems Review of Systems  Constitutional:  Negative for chills and fever.   Gastrointestinal:  Positive for abdominal pain. Negative for blood in stool, constipation, diarrhea, nausea and vomiting.  Genitourinary:  Positive for vaginal discharge. Negative for dysuria, flank pain, hematuria and pelvic pain.     Physical Exam Triage Vital Signs ED Triage Vitals [09/28/23 1548]  Encounter Vitals Group     BP (!) 143/97     Systolic BP Percentile      Diastolic BP Percentile      Pulse Rate 71     Resp 18     Temp (!) 97.1 F (36.2 C)     Temp Source Temporal     SpO2 96 %     Weight      Height      Head Circumference      Peak Flow      Pain Score 5     Pain Loc      Pain Education      Exclude from Growth Chart    No data found.  Updated Vital Signs BP (!) 143/97 (BP Location: Right Arm)   Pulse 71   Temp (!) 97.1 F (36.2 C) (Temporal)   Resp 18   LMP 09/07/2023   SpO2 96%   Visual Acuity Right Eye Distance:   Left Eye Distance:   Bilateral Distance:    Right Eye Near:   Left Eye Near:    Bilateral Near:     Physical Exam Constitutional:      General: She is not in acute distress. HENT:     Mouth/Throat:     Mouth: Mucous membranes are moist.  Cardiovascular:     Rate and Rhythm: Normal rate and regular rhythm.  Pulmonary:     Effort: Pulmonary effort is normal. No respiratory distress.  Abdominal:     General: Bowel sounds are normal.     Palpations: Abdomen is soft.     Tenderness: There is no abdominal tenderness. There is no right CVA tenderness, left CVA tenderness, guarding or rebound.  Neurological:     Mental Status: She is alert.      UC Treatments / Results  Labs (all labs ordered are listed, but only abnormal results are displayed) Labs Reviewed  POCT URINALYSIS DIP (MANUAL ENTRY) - Abnormal; Notable for the following components:      Result Value   Color, UA light yellow (*)    Leukocytes, UA Trace (*)    All other components within normal limits  POCT URINE PREGNANCY - Normal  CERVICOVAGINAL  ANCILLARY ONLY    EKG  Radiology No results found.  Procedures Procedures (including critical care time)  Medications Ordered in UC Medications - No data to display  Initial Impression / Assessment and Plan / UC Course  I have reviewed the triage vital signs and the nursing notes.  Pertinent labs & imaging results that were available during my care of the patient were reviewed by me and considered in my medical decision making (see chart for details).    Vaginal discharge, vaginal irritation, lower abdominal pain.  Urine pregnancy negative.  Urine has trace leukocytes but no nitrites.  Patient has no urinary symptoms.  She obtained vaginal self swab for testing.  Instructed her to abstain from sexual activity until all test results are back and treatment is completed if needed.  Discussed that her test results will be available in her MyChart account and that we will call if treatment is needed.  Instructed her to follow-up with her PCP if she is not improving.  Education provided on abdominal pain.  ED precautions given.  She agrees to plan of care.  Final Clinical Impressions(s) / UC Diagnoses   Final diagnoses:  Vaginal discharge  Vaginal irritation  Lower abdominal pain     Discharge Instructions      Your vaginal tests are pending.  If your test results are positive, we will call you.  Do not have sexual activity until all test results are back and treatment is completed if needed.    Follow-up with your primary care provider if your symptoms are not improving.      ED Prescriptions   None    PDMP not reviewed this encounter.   Wellington Half, NP 09/28/23 1623

## 2023-09-29 LAB — CERVICOVAGINAL ANCILLARY ONLY
Bacterial Vaginitis (gardnerella): NEGATIVE
Candida Glabrata: NEGATIVE
Candida Vaginitis: POSITIVE — AB
Chlamydia: NEGATIVE
Comment: NEGATIVE
Comment: NEGATIVE
Comment: NEGATIVE
Comment: NEGATIVE
Comment: NEGATIVE
Comment: NORMAL
Neisseria Gonorrhea: NEGATIVE
Trichomonas: NEGATIVE

## 2023-09-30 ENCOUNTER — Ambulatory Visit (HOSPITAL_COMMUNITY): Payer: Self-pay

## 2023-09-30 MED ORDER — FLUCONAZOLE 150 MG PO TABS: 150.0000 mg | ORAL_TABLET | Freq: Once | ORAL | 0 refills | Status: AC

## 2023-12-13 ENCOUNTER — Ambulatory Visit
Admission: EM | Admit: 2023-12-13 | Discharge: 2023-12-13 | Disposition: A | Discharge status: Home / Self Care | Attending: Emergency Medicine | Admitting: Emergency Medicine

## 2023-12-13 DIAGNOSIS — N898 Other specified noninflammatory disorders of vagina: Secondary | ICD-10-CM | POA: Diagnosis not present

## 2023-12-13 DIAGNOSIS — B372 Candidiasis of skin and nail: Secondary | ICD-10-CM | POA: Diagnosis not present

## 2023-12-13 DIAGNOSIS — K644 Residual hemorrhoidal skin tags: Secondary | ICD-10-CM | POA: Diagnosis not present

## 2023-12-13 LAB — POCT URINE PREGNANCY: Preg Test, Ur: NEGATIVE

## 2023-12-13 MED ORDER — FLUCONAZOLE 150 MG PO TABS: 150.0000 mg | ORAL_TABLET | Freq: Every day | ORAL | 0 refills | Status: DC

## 2023-12-13 MED ORDER — NYSTATIN 100000 UNIT/GM EX CREA: TOPICAL_CREAM | CUTANEOUS | 0 refills | Status: DC

## 2023-12-13 NOTE — ED Provider Notes (Signed)
 Kari Morales    CSN: [id number] Arrival date & time: 12/13/23  1340      History   Chief Complaint Chief Complaint  Patient presents with   Vaginal Discharge    HPI [name] is a 28 y.o. female.  Patient presents with 1 week history of vaginal discharge, vaginal itching, vaginal irritation.  She states this is similar to previous episodes of yeast infection.  She denies concern for STDs.  Patient also presents with anal lesions that have been present for a long time but have become irritated due to to vigorous wiping of the area.  She would like these checked.  No open wounds or drainage.  No fever, chills, abdominal pain, dysuria, hematuria, flank pain, pelvic pain.  Her medical history includes genital herpes, yeast vaginitis, bacterial vaginitis.    The history is provided by the patient and medical records.    Past Medical History:  Diagnosis Date   Hypertension    history   Migraine    PCOS (polycystic ovarian syndrome)     Patient Active Problem List   Diagnosis Date Noted   UTI symptoms 09/13/2020   Migraine headache 12/25/2017    Past Surgical History:  Procedure Laterality Date   CESAREAN SECTION N/A 07/30/2021   Procedure: CESAREAN SECTION;  Surgeon: Victor Claudell SAUNDERS, MD;  Location: [facility] ORS;  Service: Obstetrics;  Laterality: N/A;   DENTAL SURGERY     ganglian cyst removal in left hand     ~2007 or 2008 per pt   GANGLION CYST EXCISION Left    x2    OB History     Gravida  1   Para  1   Term      Preterm  1   AB      Living  1      SAB      IAB      Ectopic      Multiple  0   Live Births  1            Home Medications    Prior to Admission medications   Medication Sig Start Date End Date Taking? Authorizing Provider  fluconazole  (DIFLUCAN ) 150 MG tablet Take 1 tablet (150 mg total) by mouth daily. Take one tablet today.  May repeat in 3 days. 12/13/23  Yes Corlis Burnard DEL, NP  nystatin  cream (MYCOSTATIN )  Apply to affected area 2 times daily 12/13/23  Yes Corlis Burnard DEL, NP  acetaminophen  (TYLENOL ) 500 MG tablet Take 500 mg by mouth every 6 (six) hours as needed. Patient not taking: Reported on 09/18/2021    [provider]  calcium  carbonate (TUMS EX) 750 MG chewable tablet Chew 2 tablets by mouth daily. Patient not taking: Reported on 08/12/2021    [provider]  diphenhydrAMINE  (BENADRYL ) 25 MG tablet Take 50 mg by mouth every 6 (six) hours as needed for itching. Patient not taking: Reported on 08/12/2021    [provider]  ketorolac  (TORADOL ) 10 MG tablet Take 1 tablet (10 mg total) by mouth every 8 (eight) hours as needed. Patient not taking: Reported on 02/02/2023 10/09/22   [name], PA-C  metFORMIN  (GLUCOPHAGE ) 500 MG tablet Take one tablet by mouth daily for one week. Then increase to one tablet twice a day for one week.  Then two tablets twice a day. Patient not taking: Reported on 12/13/2023 02/06/23   DominicJinnie Jansky, CNM  NIFEdipine  (PROCARDIA  XL) 60 MG 24 hr tablet  Take 1 tablet (60 mg total) by mouth daily. Patient not taking: Reported on 09/18/2021 08/02/21 08/02/22  Carlin Rollene HERO, CNM  Norethindrone Acetate-Ethinyl Estrad-FE (LOESTRIN 24 FE) 1-20 MG-MCG(24) tablet Take 1 tablet by mouth daily. Patient not taking: Reported on 02/02/2023 09/18/21   Constant, Peggy, MD  oxyCODONE  (OXY IR/ROXICODONE ) 5 MG immediate release tablet Take 1-2 tablets (5-10 mg total) by mouth every 4 (four) hours as needed for moderate pain. Patient not taking: Reported on 08/12/2021 08/02/21   Carlin Rollene HERO, CNM  Prenat w/o A Vit-FeFum-FePo-FA (CONCEPT OB ) 130-92.4-1 MG CAPS Take 1 capsule by mouth daily. Patient not taking: Reported on 09/18/2021 01/28/21   Arloa Lamar SQUIBB, MD    Family History Family History  Problem Relation Age of Onset   Hypertension Mother    Cancer Paternal Grandfather    Prostate cancer Paternal Grandfather    Breast cancer Half-Sister      Social History Social History   Tobacco Use   Smoking status: Former    Types: E-cigarettes, Cigars    Quit date: 09/19/2020    Years since quitting: 3.2   Smokeless tobacco: Never  Vaping Use   Vaping status: Former   Start date: 09/03/2019  Substance Use Topics   Alcohol use: Never   Drug use: Never     Allergies   Shrimp [shellfish allergy]   Review of Systems Review of Systems  Constitutional:  Negative for chills and fever.  Gastrointestinal:  Negative for abdominal pain.  Genitourinary:  Positive for vaginal discharge. Negative for dysuria, hematuria and pelvic pain.       Anal lesion     Physical Exam Triage Vital Signs ED Triage Vitals  Encounter Vitals Group     BP      Girls Systolic BP Percentile      Girls Diastolic BP Percentile      Boys Systolic BP Percentile      Boys Diastolic BP Percentile      Pulse      Resp      Temp      Temp src      SpO2      Weight      Height      Head Circumference      Peak Flow      Pain Score      Pain Loc      Pain Education      Exclude from Growth Chart    No data found.  Updated Vital Signs BP (!) 142/88   Pulse 85   Temp 97.8 F (36.6 C)   Resp 18   LMP 12/06/2023   SpO2 98%   Visual Acuity Right Eye Distance:   Left Eye Distance:   Bilateral Distance:    Right Eye Near:   Left Eye Near:    Bilateral Near:     Physical Exam Exam conducted with a chaperone present [place], Char[employer] fundraiser).  Constitutional:      General: She is not in acute distress. HENT:     Mouth/Throat:     Mouth: Mucous membranes are moist.  Cardiovascular:     Rate and Rhythm: Normal rate and regular rhythm.  Pulmonary:     Effort: Pulmonary effort is normal. No respiratory distress.  Genitourinary:    Exam position: Lithotomy position.     Labia:        Right: Rash present.        Left: Rash present.  Vagina: No vaginal discharge.      Comments: Bilateral hypopigmented skin rash of external labia in the skin  folds.  Neurological:     Mental Status: She is alert.      UC Treatments / Results  Labs (all labs ordered are listed, but only abnormal results are displayed) Labs Reviewed  POCT URINE PREGNANCY - Normal  CERVICOVAGINAL ANCILLARY ONLY    EKG   Radiology No results found.  Procedures Procedures (including critical care time)  Medications Ordered in UC Medications - No data to display  Initial Impression / Assessment and Plan / UC Course  I have reviewed the triage vital signs and the nursing notes.  Pertinent labs & imaging results that were available during my care of the patient were reviewed by me and considered in my medical decision making (see chart for details).    Vaginal discharge, skin yeast infection, skin tag of perianal region.  Treating today with Diflucan  and nystatin  cream.  Discussed with patient that we will call her if additional treatment is needed.  Instructed her to abstain from sexual activity x 7 days.  Discussed that the perianal lesions appear to be skin tags.  Instructed her to follow-up with her PCP or gynecologist if her symptoms are not improving.  She agrees to plan of care.  Final Clinical Impressions(s) / UC Diagnoses   Final diagnoses:  Vaginal discharge  Skin yeast infection  Skin tag of perianal region     Discharge Instructions      Follow up with your primary care provider tomorrow.    Your vaginal tests are pending.  Do not have sexual activity for at least 7 days.    Take the Diflucan  and use the Nystatin  cream as directed.       ED Prescriptions     Medication Sig Dispense Auth. Provider   fluconazole  (DIFLUCAN ) 150 MG tablet Take 1 tablet (150 mg total) by mouth daily. Take one tablet today.  May repeat in 3 days. 2 tablet Corlis Burnard DEL, NP   nystatin  cream (MYCOSTATIN ) Apply to affected area 2 times daily 30 g Corlis Burnard DEL, NP      PDMP not reviewed this encounter.   Corlis Burnard DEL, NP 12/13/23 [phone]

## 2023-12-13 NOTE — ED Triage Notes (Signed)
 Patient to Urgent Care with complaints of vaginal itching/ irritation/ discharge.  Symptoms x1 week. No STD concerns.   Also w/ complaints of a lesion next to her anus that she would like to be seen. Denies any pain/ itching/ bleeding. Reports it has been there for a long time and intermittently reappears.

## 2023-12-13 NOTE — Discharge Instructions (Addendum)
 Follow up with your primary care provider tomorrow.    Your vaginal tests are pending.  Do not have sexual activity for at least 7 days.    Take the Diflucan  and use the Nystatin  cream as directed.

## 2023-12-14 LAB — CERVICOVAGINAL ANCILLARY ONLY
Bacterial Vaginitis (gardnerella): NEGATIVE
Candida Glabrata: NEGATIVE
Candida Vaginitis: POSITIVE — AB
Comment: NEGATIVE
Comment: NEGATIVE
Comment: NEGATIVE

## 2023-12-15 ENCOUNTER — Ambulatory Visit (HOSPITAL_COMMUNITY): Payer: Self-pay

## 2024-01-07 ENCOUNTER — Ambulatory Visit: Admission: EM | Admit: 2024-01-07 | Discharge: 2024-01-07 | Disposition: A | Discharge status: Home / Self Care

## 2024-01-07 ENCOUNTER — Encounter: Payer: Self-pay | Admitting: Emergency Medicine

## 2024-01-07 DIAGNOSIS — N898 Other specified noninflammatory disorders of vagina: Secondary | ICD-10-CM | POA: Diagnosis not present

## 2024-01-07 HISTORY — DX: Personal history of other infectious and parasitic diseases: Z86.19

## 2024-01-07 MED ORDER — VALACYCLOVIR HCL 1 G PO TABS: 1000.0000 mg | ORAL_TABLET | Freq: Two times a day (BID) | ORAL | 0 refills | Status: AC

## 2024-01-07 MED ORDER — VALACYCLOVIR HCL 500 MG PO TABS: 500.0000 mg | ORAL_TABLET | Freq: Two times a day (BID) | ORAL | 3 refills | Status: AC

## 2024-01-07 NOTE — Discharge Instructions (Signed)
  1. Vaginal lesion (Primary) - HSV 1/2 PCR (Surface) swab collected in UC and sent to lab for further testing results should be available in 2 days. -If results are positive patient is already taking valacyclovir  as prescribed and should continue taking medication.  If testing is negative patient can be contacted and advised to discontinue use of medication. - valACYclovir  (VALTREX ) 1000 MG tablet; Take 1 tablet (1,000 mg total) by mouth 2 (two) times daily for 7 days.  Dispense: 14 tablet; Refill: 0 - valACYclovir  (VALTREX ) 500 MG tablet; Take 1 tablet (500 mg total) by mouth 2 (two) times daily for 3 days.  Dispense: 6 tablet; Refill: 3 -Continue to monitor symptoms for any change in severity if there is any escalation of current symptoms or development of new symptoms follow-up in ER for further evaluation and management.

## 2024-01-07 NOTE — ED Provider Notes (Signed)
 [facility] [place]  Note:  This document was prepared using Conservation officer, historic buildings and may include unintentional dictation errors.  MRN: [id number] DOB: 02/20/1996  Subjective:   Kari Morales is a 28 y.o. female presenting for evaluation of vaginal lesions x 3 days.  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  Patient does not know when she was exposed but states until now she has never had any outbreak of vaginal lesions.  Patient is concerned that it may be her first HSV-2 outbreak.  Patient reports vaginal lesions that are slightly painful and noticed some bleeding.  Patient states that she has never been prescribed antiviral medication to treat HSV-2.  Patient would like evaluation and swabbing if possible.  Patient denies any dysuria, increased urinary frequency, vaginal discharge, abdominal pain, flank pain.  No current facility-administered medications for this encounter.  Current Outpatient Medications:    valACYclovir  (VALTREX ) 1000 MG tablet, Take 1 tablet (1,000 mg total) by mouth 2 (two) times daily for 7 days., Disp: 14 tablet, Rfl: 0   valACYclovir  (VALTREX ) 500 MG tablet, Take 1 tablet (500 mg total) by mouth 2 (two) times daily for 3 days., Disp: 6 tablet, Rfl: 3   acetaminophen  (TYLENOL ) 500 MG tablet, Take 500 mg by mouth every 6 (six) hours as needed. (Patient not taking: Reported on 09/18/2021), Disp: , Rfl:    calcium  carbonate (TUMS EX) 750 MG chewable tablet, Chew 2 tablets by mouth daily. (Patient not taking: Reported on 08/12/2021), Disp: , Rfl:    diphenhydrAMINE  (BENADRYL ) 25 MG tablet, Take 50 mg by mouth every 6 (six) hours as needed for itching. (Patient not taking: Reported on 08/12/2021), Disp: , Rfl:    fluconazole  (DIFLUCAN ) 150 MG tablet, Take 1 tablet (150 mg total) by mouth daily. Take one tablet today.  May repeat in 3 days., Disp: 2 tablet, Rfl: 0   ketorolac  (TORADOL ) 10 MG tablet, Take 1 tablet (10 mg total)  by mouth every 8 (eight) hours as needed. (Patient not taking: Reported on 02/02/2023), Disp: 20 tablet, Rfl: 0   metFORMIN  (GLUCOPHAGE ) 500 MG tablet, Take one tablet by mouth daily for one week. Then increase to one tablet twice a day for one week.  Then two tablets twice a day. (Patient not taking: Reported on 12/13/2023), Disp: 60 tablet, Rfl: 5   NIFEdipine  (PROCARDIA  XL) 60 MG 24 hr tablet, Take 1 tablet (60 mg total) by mouth daily. (Patient not taking: Reported on 09/18/2021), Disp: 30 tablet, Rfl: 11   Norethindrone Acetate-Ethinyl Estrad-FE (LOESTRIN 24 FE) 1-20 MG-MCG(24) tablet, Take 1 tablet by mouth daily. (Patient not taking: Reported on 02/02/2023), Disp: 28 tablet, Rfl: 11   nystatin  cream (MYCOSTATIN ), Apply to affected area 2 times daily, Disp: 30 g, Rfl: 0   oxyCODONE  (OXY IR/ROXICODONE ) 5 MG immediate release tablet, Take 1-2 tablets (5-10 mg total) by mouth every 4 (four) hours as needed for moderate pain. (Patient not taking: Reported on 08/12/2021), Disp: 30 tablet, Rfl: 0   Prenat w/o A Vit-FeFum-FePo-FA (CONCEPT OB ) 130-92.4-1 MG CAPS, Take 1 capsule by mouth daily. (Patient not taking: Reported on 09/18/2021), Disp: 30 capsule, Rfl: 11   Allergies  Allergen Reactions   Shrimp [Shellfish Allergy] Anaphylaxis    Past Medical History:  Diagnosis Date   History of PCR DNA positive for HSV2    Hypertension    history   Migraine    PCOS (polycystic ovarian syndrome)      Past Surgical History:  Procedure Laterality Date   CESAREAN SECTION N/A 07/30/2021   Procedure: CESAREAN SECTION;  Surgeon: Victor Claudell SAUNDERS, MD;  Location: [facility] ORS;  Service: Obstetrics;  Laterality: N/A;   DENTAL SURGERY     ganglian cyst removal in left hand     ~2007 or 2008 per pt   GANGLION CYST EXCISION Left    x2    Family History  Problem Relation Age of Onset   Hypertension Mother    Cancer Paternal Grandfather    Prostate cancer Paternal Grandfather    Breast cancer Half-Sister      Social History   Tobacco Use   Smoking status: Former    Types: E-cigarettes, Cigars    Quit date: 09/19/2020    Years since quitting: 3.3   Smokeless tobacco: Never  Vaping Use   Vaping status: Former   Start date: 09/03/2019  Substance Use Topics   Alcohol use: Never   Drug use: Never    ROS Refer to HPI for ROS details.  Objective:   Vitals: BP (!) 140/88 (BP Location: Right Arm)   Pulse 88   Temp 98.3 F (36.8 C) (Oral)   Resp 18   LMP 12/23/2023 (Approximate)   SpO2 98%   Physical Exam Vitals and nursing note reviewed. Exam conducted with a chaperone present.  Constitutional:      General: She is not in acute distress.    Appearance: Normal appearance. She is not ill-appearing.  HENT:     Head: Normocephalic.  Cardiovascular:     Rate and Rhythm: Normal rate.  Pulmonary:     Effort: Pulmonary effort is normal. No respiratory distress.  Genitourinary:    Vagina: Bleeding and lesions present. No vaginal discharge or erythema.  Skin:    General: Skin is warm and dry.     Capillary Refill: Capillary refill takes less than 2 seconds.  Neurological:     General: No focal deficit present.     Mental Status: She is alert and oriented to person, place, and time.  Psychiatric:        Mood and Affect: Mood normal.        Behavior: Behavior normal.     Procedures  No results found for this or any previous visit (from the past 24 hours).  No results found.   Assessment and Plan :     Discharge Instructions       1. Vaginal lesion (Primary) - HSV 1/2 PCR (Surface) swab collected in UC and sent to lab for further testing results should be available in 2 days. -If results are positive patient is already taking valacyclovir  as prescribed and should continue taking medication.  If testing is negative patient can be contacted and advised to discontinue use of medication. - valACYclovir  (VALTREX ) 1000 MG tablet; Take 1 tablet (1,000 mg total) by mouth 2  (two) times daily for 7 days.  Dispense: 14 tablet; Refill: 0 - valACYclovir  (VALTREX ) 500 MG tablet; Take 1 tablet (500 mg total) by mouth 2 (two) times daily for 3 days.  Dispense: 6 tablet; Refill: 3 -Continue to monitor symptoms for any change in severity if there is any escalation of current symptoms or development of new symptoms follow-up in ER for further evaluation and management.      Kendrea Cerritos B Uyen Eichholz   Tierria Watson, [place], [place] 01/07/24 1611

## 2024-01-07 NOTE — ED Triage Notes (Signed)
 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 Patient denies urinary symptoms. Patient concerned that she is having an outbreak. Patient denies pain at this time

## 2024-01-08 LAB — HSV 1/2 PCR (SURFACE)
HSV-1 DNA: NOT DETECTED
HSV-2 DNA: NOT DETECTED

## 2024-01-19 ENCOUNTER — Other Ambulatory Visit: Payer: Self-pay

## 2024-01-19 ENCOUNTER — Emergency Department: Admission: EM | Admit: 2024-01-19 | Discharge: 2024-01-19 | Disposition: A | Discharge status: Home / Self Care

## 2024-01-19 DIAGNOSIS — I10 Essential (primary) hypertension: Secondary | ICD-10-CM | POA: Insufficient documentation

## 2024-01-19 DIAGNOSIS — L309 Dermatitis, unspecified: Secondary | ICD-10-CM | POA: Insufficient documentation

## 2024-01-19 DIAGNOSIS — L292 Pruritus vulvae: Secondary | ICD-10-CM | POA: Diagnosis present

## 2024-01-19 LAB — URINALYSIS, ROUTINE W REFLEX MICROSCOPIC
Bilirubin Urine: NEGATIVE
Glucose, UA: NEGATIVE mg/dL
Hgb urine dipstick: NEGATIVE
Ketones, ur: NEGATIVE mg/dL
Nitrite: NEGATIVE
Protein, ur: NEGATIVE mg/dL
Specific Gravity, Urine: 1.011 (ref 1.005–1.030)
pH: 6 (ref 5.0–8.0)

## 2024-01-19 LAB — WET PREP, GENITAL
Clue Cells Wet Prep HPF POC: NONE SEEN
Sperm: NONE SEEN
Trich, Wet Prep: NONE SEEN
WBC, Wet Prep HPF POC: >=10 — AB
Yeast Wet Prep HPF POC: NONE SEEN

## 2024-01-19 LAB — POC URINE PREG, ED: Preg Test, Ur: NEGATIVE

## 2024-01-19 MED ORDER — TRIAMCINOLONE ACETONIDE 0.1 % EX CREA: 1.0000 | TOPICAL_CREAM | Freq: Two times a day (BID) | CUTANEOUS | 0 refills | Status: DC

## 2024-01-19 NOTE — ED Provider Notes (Signed)
 [facility] Provider Note    Event Date/Time   First MD Initiated Contact with Patient 01/19/24 1118     (approximate)   History   Vaginal Itching   HPI  Kari Morales is a 28 y.o. female with PMH of PCOS, migraine, hypertension and HSV-2 who presents for evaluation of vaginal itching and irritation that began about 3 days ago.  Patient states she has a history of yeast infections and this feels similar.  She also is concerned about an anal lesion.  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  Patient denies fever and urinary symptoms.  She is sexually active but with only 1 partner and is not concerned about STDs today.  She reports she frequently gets yeast infections and wants to know why.      Physical Exam   Triage Vital Signs: ED Triage Vitals  Encounter Vitals Group     BP 01/19/24 1111 (!) 156/114     Girls Systolic BP Percentile --      Girls Diastolic BP Percentile --      Boys Systolic BP Percentile --      Boys Diastolic BP Percentile --      Pulse Rate 01/19/24 1111 97     Resp 01/19/24 1111 19     Temp 01/19/24 1111 98.4 F (36.9 C)     Temp src --      SpO2 01/19/24 1111 100 %     Weight 01/19/24 1112 173 lb (78.5 kg)     Height 01/19/24 1112 5' 4 (1.626 m)     Head Circumference --      Peak Flow --      Pain Score 01/19/24 1112 4     Pain Loc --      Pain Education --      Exclude from Growth Chart --     Most recent vital signs: Vitals:   01/19/24 1111  BP: (!) 156/114  Pulse: 97  Resp: 19  Temp: 98.4 F (36.9 C)  SpO2: 100%   General: Awake, no distress.  CV:  Good peripheral perfusion.  Resp:  Normal effort.  Abd:  No distention.  Pelvic:  Skin irritation with some erythema, dry and cracked skin in the fold between the thigh and labia majora, anal skin tag present, white discharge in the vaginal vault, no cervical motion tenderness, no adnexal  tenderness on bimanual exam.   ED Results / Procedures / Treatments   Labs (all labs ordered are listed, but only abnormal results are displayed) Labs Reviewed  WET PREP, GENITAL - Abnormal; Notable for the following components:      Result Value   WBC, Wet Prep HPF POC >=10 (*)    All other components within normal limits  URINALYSIS, ROUTINE W REFLEX MICROSCOPIC - Abnormal; Notable for the following components:   Color, Urine YELLOW (*)    APPearance CLEAR (*)    Leukocytes,Ua SMALL (*)    Bacteria, UA RARE (*)    All other components within normal limits  POC URINE PREG, ED     PROCEDURES:  Critical Care performed: No  Procedures   MEDICATIONS ORDERED IN ED: Medications - No data to display   IMPRESSION / MDM / ASSESSMENT AND PLAN / ED COURSE  I reviewed the triage vital signs and the nursing notes.  28 year old female presents for evaluation of vaginal itching.  Blood pressure is elevated on initial presentation.  Vital signs stable otherwise.  Patient NAD on exam.  Differential diagnosis includes, but is not limited to, yeast infection, BV, herpes, anal skin tag, hemorrhoids, STD.  Patient's presentation is most consistent with acute complicated illness / injury requiring diagnostic workup.  Will obtain wet prep, urinalysis and pregnancy test.  Offered to test patient for gonorrhea chlamydia but she declined stating she only has 1 sexual partner.  On pelvic exam I did note the anal lesion patient is concerned about.  Appears to be consistent with a skin tag.  Does not seem consistent with HSV as there is only 1 lesion and it does not appear to be fluid-filled.  Did not see any indication to swab this as there was no fluid to collect. Patient was given reassurance that this appears consistent with a skin tag and not a herpes outbreak. Did discuss signs and symptoms of herpes for her to watch for.   Wet prep showed WBCs only, no clue cells  or yeast. Pregnancy test is negative. UA is unremarkable aside from some leukocytes and rare bacteria. Do not feel this is consistent with UTI.  Patient reports that most of her itching is external on her vulva. On pelvic exam I did observe excoriations and some skin break down. Given the negative wet prep feel this is most consistent with vulvar dermatitis. Will prescribe a topical steroid. Also advised patient to follow up with an OBGYN and I attached information on how to do so.   Patient voiced understanding, all questions were answered and she was stable at discharge.     FINAL CLINICAL IMPRESSION(S) / ED DIAGNOSES   Final diagnoses:  Vulvar dermatitis     Rx / DC Orders   ED Discharge Orders          Ordered    triamcinolone cream (KENALOG) 0.1 %  2 times daily        01/19/24 1339             Note:  This document was prepared using Dragon voice recognition software and may include unintentional dictation errors.   Cleaster Tinnie LABOR, PA-C 01/19/24 1512    Clarine Ozell LABOR, MD 01/19/24 1550

## 2024-01-19 NOTE — ED Triage Notes (Signed)
 Pt comes in from home with complaints of vaginal itching and  irritation that started about 3 days ago. Pt has a history of yeast infections, and believes she is having one now. Pt has a history of hsv2, and feels that she also may be having an out break. Pt complains of pain 4/10. Pt anxious in triage, as her son is being seen in the ER as well.

## 2024-01-19 NOTE — Discharge Instructions (Addendum)
 You tested negative for yeast, BV and trichomonas.  Your urinalysis did not show signs of infection.  Your pregnancy test was negative.  I believe your itchiness is due to irritation of the skin.  I have prescribed topical steroid for you to use to help with this.  Please follow-up with your primary care provider.  I have also attached information for an OB/GYN in the area.  Call to schedule follow-up with them.

## 2024-01-27 DIAGNOSIS — I10 Essential (primary) hypertension: Secondary | ICD-10-CM | POA: Diagnosis not present

## 2024-01-27 DIAGNOSIS — Z0000 Encounter for general adult medical examination without abnormal findings: Secondary | ICD-10-CM | POA: Diagnosis not present

## 2024-01-27 DIAGNOSIS — F4310 Post-traumatic stress disorder, unspecified: Secondary | ICD-10-CM | POA: Diagnosis not present

## 2024-01-27 DIAGNOSIS — L709 Acne, unspecified: Secondary | ICD-10-CM | POA: Diagnosis not present

## 2024-01-27 DIAGNOSIS — Z1389 Encounter for screening for other disorder: Secondary | ICD-10-CM | POA: Diagnosis not present

## 2024-01-27 DIAGNOSIS — E282 Polycystic ovarian syndrome: Secondary | ICD-10-CM | POA: Diagnosis not present

## 2024-01-27 DIAGNOSIS — F17200 Nicotine dependence, unspecified, uncomplicated: Secondary | ICD-10-CM | POA: Diagnosis not present

## 2024-01-27 DIAGNOSIS — Z309 Encounter for contraceptive management, unspecified: Secondary | ICD-10-CM | POA: Diagnosis not present

## 2024-01-27 DIAGNOSIS — N9089 Other specified noninflammatory disorders of vulva and perineum: Secondary | ICD-10-CM | POA: Diagnosis not present

## 2024-01-27 DIAGNOSIS — Z113 Encounter for screening for infections with a predominantly sexual mode of transmission: Secondary | ICD-10-CM | POA: Diagnosis not present

## 2024-01-27 DIAGNOSIS — F1290 Cannabis use, unspecified, uncomplicated: Secondary | ICD-10-CM | POA: Diagnosis not present

## 2024-01-27 DIAGNOSIS — Z87828 Personal history of other (healed) physical injury and trauma: Secondary | ICD-10-CM | POA: Diagnosis not present

## 2024-01-27 DIAGNOSIS — Z91414 Personal history of adult intimate partner abuse: Secondary | ICD-10-CM | POA: Diagnosis not present

## 2024-04-19 ENCOUNTER — Ambulatory Visit
Admission: RE | Admit: 2024-04-19 | Discharge: 2024-04-19 | Disposition: A | Discharge status: Home / Self Care | Source: Ambulatory Visit | Attending: Emergency Medicine | Admitting: Emergency Medicine

## 2024-04-19 VITALS — BP 139/76 | HR 81 | Temp 98.2°F | Resp 16

## 2024-04-19 DIAGNOSIS — N898 Other specified noninflammatory disorders of vagina: Secondary | ICD-10-CM | POA: Diagnosis not present

## 2024-04-19 LAB — POCT URINE PREGNANCY: Preg Test, Ur: NEGATIVE

## 2024-04-19 MED ORDER — FLUCONAZOLE 150 MG PO TABS: 150.0000 mg | ORAL_TABLET | ORAL | 0 refills | Status: DC | PRN

## 2024-04-19 NOTE — Discharge Instructions (Addendum)
 Today you are being treated  for yeast.   Take diflucan  150 mg once, if symptoms still present in 3 days then you may take second pill   Yeast infections which are caused by a naturally occurring fungus called candida. Vaginosis is an inflammation of the vagina that can result in discharge, itching and pain. The cause is usually a change in the normal balance of vaginal bacteria or an infection. Vaginosis can also result from reduced estrogen levels after menopause.  Labs pending 2-3 days, you will be contacted if positive for any sti and treatment will be sent to the pharmacy, you will have to return to the clinic if positive for gonorrhea to receive treatment   Please refrain from having sex until labs results, if positive please refrain from having sex until treatment complete and symptoms resolve   If positive for  Chlamydia  gonorrhea or trichomoniasis please notify partner or partners so they may tested as well  Moving forward, it is recommended you use some form of protection against the transmission of sti infections  such as condoms or dental dams with each sexual encounter    In addition:   Avoid baths, hot tubs and whirlpool spas.  Don't use scented or harsh soaps, such as those with deodorant or antibacterial action. Avoid irritants. These include scented tampons and pads. Wipe from front to back after using the toilet.  Don't douche. Your vagina doesn't require cleansing other than normal bathing.  Use a  condom. Wear cotton underwear, this fabric helps absorb moisture

## 2024-04-19 NOTE — ED Provider Notes (Signed)
 " [facility]    CSN: [id number] Arrival date & time: 04/19/24  1731      History   Chief Complaint Chief Complaint  Patient presents with   Vaginal Discharge   Vaginal Itching    HPI Kari Morales is a 28 y.o. female.   Patient presents for vaginal discharge described as yellow and vaginal itching present for 4 days, believes to be yeast infection.  Has not attempted treatment as she endorses the topical medicine typically causes further irritation.  Denies vaginal odor, abdominal or flank pain, urinary symptoms.  Last menstrual period 03/19/2024 to 03/29/2024, experiencing 10 days which is atypical as well as heavier bleeding, clotting and in the lower pelvic plan.  History of PCOS.  I suspect okay    Past Medical History:  Diagnosis Date   History of PCR DNA positive for HSV2    Hypertension    history   Migraine    PCOS (polycystic ovarian syndrome)     Patient Active Problem List   Diagnosis Date Noted   UTI symptoms 09/13/2020   Migraine headache 12/25/2017    Past Surgical History:  Procedure Laterality Date   CESAREAN SECTION N/A 07/30/2021   Procedure: CESAREAN SECTION;  Surgeon: Victor Claudell SAUNDERS, MD;  Location: [facility] ORS;  Service: Obstetrics;  Laterality: N/A;   DENTAL SURGERY     ganglian cyst removal in left hand     ~2007 or 2008 per pt   GANGLION CYST EXCISION Left    x2    OB History     Gravida  1   Para  1   Term      Preterm  1   AB      Living  1      SAB      IAB      Ectopic      Multiple  0   Live Births  1            Home Medications    Prior to Admission medications  Medication Sig Start Date End Date Taking? Authorizing Provider  acetaminophen  (TYLENOL ) 500 MG tablet Take 500 mg by mouth every 6 (six) hours as needed. Patient not taking: Reported on 09/18/2021    [provider]  calcium  carbonate (TUMS EX) 750 MG chewable tablet Chew 2 tablets by mouth daily. Patient not taking:  Reported on 08/12/2021    [provider]  diphenhydrAMINE  (BENADRYL ) 25 MG tablet Take 50 mg by mouth every 6 (six) hours as needed for itching. Patient not taking: Reported on 08/12/2021    [provider]  fluconazole  (DIFLUCAN ) 150 MG tablet Take 1 tablet (150 mg total) by mouth daily. Take one tablet today.  May repeat in 3 days. 12/13/23   Corlis Burnard DEL, NP  ketorolac  (TORADOL ) 10 MG tablet Take 1 tablet (10 mg total) by mouth every 8 (eight) hours as needed. Patient not taking: Reported on 02/02/2023 10/09/22   [name], PA-C  metFORMIN  (GLUCOPHAGE ) 500 MG tablet Take one tablet by mouth daily for one week. Then increase to one tablet twice a day for one week.  Then two tablets twice a day. Patient not taking: Reported on 12/13/2023 02/06/23   Delinda Jinnie Jansky, CNM  NIFEdipine  (PROCARDIA  XL) 60 MG 24 hr tablet Take 1 tablet (60 mg total) by mouth daily. Patient not taking: Reported on 09/18/2021 08/02/21 08/02/22  Carlin Rollene HERO, CNM  Norethindrone Acetate-Ethinyl Estrad-FE (LOESTRIN 24 FE) 1-20 MG-MCG(24)  tablet Take 1 tablet by mouth daily. Patient not taking: Reported on 02/02/2023 09/18/21   Constant, Peggy, MD  nystatin  cream (MYCOSTATIN ) Apply to affected area 2 times daily 12/13/23   Corlis Burnard DEL, NP  oxyCODONE  (OXY IR/ROXICODONE ) 5 MG immediate release tablet Take 1-2 tablets (5-10 mg total) by mouth every 4 (four) hours as needed for moderate pain. Patient not taking: Reported on 08/12/2021 08/02/21   Carlin Rollene HERO, CNM  Prenat w/o A Vit-FeFum-FePo-FA (CONCEPT OB ) 130-92.4-1 MG CAPS Take 1 capsule by mouth daily. Patient not taking: Reported on 09/18/2021 01/28/21   Arloa Lamar SQUIBB, MD  triamcinolone  cream (KENALOG ) 0.1 % Apply 1 Application topically 2 (two) times daily. 01/19/24   Cleaster Tinnie LABOR, PA-C    Family History Family History  Problem Relation Age of Onset   Hypertension Mother    Cancer Paternal Grandfather    Prostate cancer Paternal  Grandfather    Breast cancer Half-Sister     Social History Social History[1]   Allergies   Shrimp [shellfish allergy]   Review of Systems Review of Systems  Genitourinary:  Positive for vaginal discharge. Negative for decreased urine volume, difficulty urinating, dyspareunia, dysuria, enuresis, flank pain, frequency, genital sores, hematuria, menstrual problem, pelvic pain, urgency, vaginal bleeding and vaginal pain.     Physical Exam Triage Vital Signs ED Triage Vitals  Encounter Vitals Group     BP 04/19/24 1809 139/76     Girls Systolic BP Percentile --      Girls Diastolic BP Percentile --      Boys Systolic BP Percentile --      Boys Diastolic BP Percentile --      Pulse Rate 04/19/24 1809 81     Resp 04/19/24 1809 16     Temp 04/19/24 1809 98.2 F (36.8 C)     Temp Source 04/19/24 1809 Oral     SpO2 04/19/24 1809 97 %     Weight --      Height --      Head Circumference --      Peak Flow --      Pain Score 04/19/24 1803 0     Pain Loc --      Pain Education --      Exclude from Growth Chart --    No data found.  Updated Vital Signs BP 139/76 (BP Location: Left Arm)   Pulse 81   Temp 98.2 F (36.8 C) (Oral)   Resp 16   LMP 03/19/2024 (Exact Date)   SpO2 97%   Visual Acuity Right Eye Distance:   Left Eye Distance:   Bilateral Distance:    Right Eye Near:   Left Eye Near:    Bilateral Near:     Physical Exam Constitutional:      Appearance: Normal appearance.  Eyes:     Extraocular Movements: Extraocular movements intact.  Pulmonary:     Effort: Pulmonary effort is normal.  Genitourinary:    Comments: deferred Neurological:     Mental Status: She is alert and oriented to person, place, and time. Mental status is at baseline.      UC Treatments / Results  Labs (all labs ordered are listed, but only abnormal results are displayed) Labs Reviewed  POCT URINE PREGNANCY    EKG   Radiology No results found.  Procedures Procedures  (including critical care time)  Medications Ordered in UC Medications - No data to display  Initial Impression / Assessment and Plan / UC  Course  I have reviewed the triage vital signs and the nursing notes.  Pertinent labs & imaging results that were available during my care of the patient were reviewed by me and considered in my medical decision making (see chart for details).  Vaginal discharge  Treating empirically for yeast, Diflucan  prescribed, urine pregnancy test negative, discussed findings with patient, STI labs pending will treat per protocol, advised abstinence until lab results, and/or treatment is complete, advised condom use during all sexual encounters moving, may follow-up with urgent care as needed , discussed if irregularity of menstruation continues to follow-up with her OB/GYN for further evaluation, verbalized understanding Final Clinical Impressions(s) / UC Diagnoses   Final diagnoses:  Vaginal discharge   Discharge Instructions   None    ED Prescriptions   None    PDMP not reviewed this encounter.     [1]  Social History Tobacco Use   Smoking status: Former    Types: E-cigarettes, Cigars    Quit date: 09/19/2020    Years since quitting: 3.5   Smokeless tobacco: Never  Vaping Use   Vaping status: Former   Start date: 09/03/2019  Substance Use Topics   Alcohol use: Yes   Drug use: Never     Teresa Shelba SAUNDERS, NP 04/19/24 1922  "

## 2024-04-19 NOTE — ED Triage Notes (Signed)
 Patient reports vaginal itching and discharge x 3-4 days. Patient jhas not taking nothing for symptoms.

## 2024-04-20 ENCOUNTER — Ambulatory Visit (HOSPITAL_COMMUNITY): Payer: Self-pay

## 2024-04-20 LAB — CERVICOVAGINAL ANCILLARY ONLY
Bacterial Vaginitis (gardnerella): NEGATIVE
Candida Glabrata: NEGATIVE
Candida Vaginitis: POSITIVE — AB
Chlamydia: NEGATIVE
Comment: NEGATIVE
Comment: NEGATIVE
Comment: NEGATIVE
Comment: NEGATIVE
Comment: NEGATIVE
Comment: NORMAL
Neisseria Gonorrhea: NEGATIVE
Trichomonas: NEGATIVE

## 2024-04-21 ENCOUNTER — Emergency Department: Admission: EM | Admit: 2024-04-21 | Discharge: 2024-04-21 | Disposition: A | Discharge status: Home / Self Care

## 2024-04-21 ENCOUNTER — Other Ambulatory Visit: Payer: Self-pay

## 2024-04-21 ENCOUNTER — Encounter: Payer: Self-pay | Admitting: Emergency Medicine

## 2024-04-21 DIAGNOSIS — R109 Unspecified abdominal pain: Secondary | ICD-10-CM | POA: Diagnosis present

## 2024-04-21 DIAGNOSIS — R030 Elevated blood-pressure reading, without diagnosis of hypertension: Secondary | ICD-10-CM | POA: Diagnosis not present

## 2024-04-21 DIAGNOSIS — D72829 Elevated white blood cell count, unspecified: Secondary | ICD-10-CM | POA: Insufficient documentation

## 2024-04-21 DIAGNOSIS — K2920 Alcoholic gastritis without bleeding: Secondary | ICD-10-CM | POA: Diagnosis not present

## 2024-04-21 LAB — COMPREHENSIVE METABOLIC PANEL WITH GFR
ALT: 15 U/L (ref 0–44)
AST: 26 U/L (ref 15–41)
Albumin: 4.9 g/dL (ref 3.5–5.0)
Alkaline Phosphatase: 74 U/L (ref 38–126)
Anion gap: 16 — ABNORMAL HIGH (ref 5–15)
BUN: 9 mg/dL (ref 6–20)
CO2: 18 mmol/L — ABNORMAL LOW (ref 22–32)
Calcium: 9.7 mg/dL (ref 8.9–10.3)
Chloride: 105 mmol/L (ref 98–111)
Creatinine, Ser: 0.88 mg/dL (ref 0.44–1.00)
GFR, Estimated: >60 mL/min
Glucose, Bld: 142 mg/dL — ABNORMAL HIGH (ref 70–99)
Potassium: 3.7 mmol/L (ref 3.5–5.1)
Sodium: 139 mmol/L (ref 135–145)
Total Bilirubin: 1.3 mg/dL — ABNORMAL HIGH (ref 0.0–1.2)
Total Protein: 8.4 g/dL — ABNORMAL HIGH (ref 6.5–8.1)

## 2024-04-21 LAB — URINALYSIS, ROUTINE W REFLEX MICROSCOPIC
Bacteria, UA: NONE SEEN
Bilirubin Urine: NEGATIVE
Glucose, UA: 50 mg/dL — AB
Ketones, ur: 5 mg/dL — AB
Leukocytes,Ua: NEGATIVE
Nitrite: NEGATIVE
Protein, ur: 30 mg/dL — AB
Specific Gravity, Urine: 1.014 (ref 1.005–1.030)
pH: 7 (ref 5.0–8.0)

## 2024-04-21 LAB — CBC
HCT: 44.6 % (ref 36.0–46.0)
Hemoglobin: 15.4 g/dL — ABNORMAL HIGH (ref 12.0–15.0)
MCH: 27.3 pg (ref 26.0–34.0)
MCHC: 34.5 g/dL (ref 30.0–36.0)
MCV: 79.1 fL — ABNORMAL LOW (ref 80.0–100.0)
Platelets: 236 K/uL (ref 150–400)
RBC: 5.64 MIL/uL — ABNORMAL HIGH (ref 3.87–5.11)
RDW: 12.8 % (ref 11.5–15.5)
WBC: 12.1 K/uL — ABNORMAL HIGH (ref 4.0–10.5)
nRBC: 0 % (ref 0.0–0.2)

## 2024-04-21 LAB — LIPASE, BLOOD: Lipase: 16 U/L (ref 11–51)

## 2024-04-21 LAB — TROPONIN T, HIGH SENSITIVITY: Troponin T High Sensitivity: <15 ng/L (ref 0–19)

## 2024-04-21 MED ORDER — ONDANSETRON 4 MG PO TBDP
4.0000 mg | ORAL_TABLET | Freq: Once | ORAL | Status: AC | PRN
  Administered 2024-04-21: 4 mg via ORAL
  Filled 2024-04-21: qty 1

## 2024-04-21 MED ORDER — ONDANSETRON 4 MG PO TBDP: 4.0000 mg | ORAL_TABLET | Freq: Three times a day (TID) | ORAL | 0 refills | Status: DC | PRN

## 2024-04-21 MED ORDER — FAMOTIDINE 10 MG PO TABS: 10.0000 mg | ORAL_TABLET | Freq: Two times a day (BID) | ORAL | 0 refills | Status: DC

## 2024-04-21 MED ORDER — ONDANSETRON HCL 4 MG/2ML IJ SOLN
4.0000 mg | Freq: Once | INTRAMUSCULAR | Status: AC
  Administered 2024-04-21: 4 mg via INTRAVENOUS
  Filled 2024-04-21: qty 2

## 2024-04-21 MED ORDER — SODIUM CHLORIDE 0.9 % IV BOLUS
1000.0000 mL | Freq: Once | INTRAVENOUS | Status: AC
  Administered 2024-04-21: 1000 mL via INTRAVENOUS

## 2024-04-21 NOTE — ED Notes (Signed)
 ED PA aware of BP of 159/103

## 2024-04-21 NOTE — ED Triage Notes (Signed)
 Pt reports drinking too much liquor last night and unable to keep anything down. Endorses lower abd cramping.

## 2024-04-21 NOTE — Discharge Instructions (Signed)
 Follow-up with your primary care for recheck of your blood pressure as it was elevated in the emergency department.  Even before leaving your blood pressure was 159/103 which may be related to not feeling well and the vomiting but this should be followed up with a recheck of your blood pressure.  Stay on clear liquids the remainder of the day.  You may also use popsicles as tolerated.  A prescription for Zofran  was sent to the pharmacy along with prescription for Pepcid  AC to help coat your stomach.  No alcohol for 1 week to give your stomach a chance to heal.

## 2024-04-21 NOTE — ED Provider Notes (Signed)
 "  [facility] Provider Note    Event Date/Time   First MD Initiated Contact with Patient 04/21/24 1151     (approximate)   History   Emesis   HPI  Kari Morales is a 29 y.o. female   presents to the ED with complaint of vomiting and abdominal cramps after drinking too much liquor last night.  Patient denies any use of marijuana.  She reports drinking light-colored alcohol and also dark.  Patient reports that she took some Pepto-Bismol this morning and continued to vomit.  Patient has a history of hypertension, migraines, PCOS.      Physical Exam   Triage Vital Signs: ED Triage Vitals  Encounter Vitals Group     BP 04/21/24 1116 (!) 154/123     Girls Systolic BP Percentile --      Girls Diastolic BP Percentile --      Boys Systolic BP Percentile --      Boys Diastolic BP Percentile --      Pulse Rate 04/21/24 1116 80     Resp 04/21/24 1116 18     Temp 04/21/24 1119 97.7 F (36.5 C)     Temp Source 04/21/24 1119 Oral     SpO2 04/21/24 1116 100 %     Weight 04/21/24 1118 173 lb 1 oz (78.5 kg)     Height 04/21/24 1118 5' 4 (1.626 m)     Head Circumference --      Peak Flow --      Pain Score 04/21/24 1117 9     Pain Loc --      Pain Education --      Exclude from Growth Chart --     Most recent vital signs: Vitals:   04/21/24 1119 04/21/24 1502  BP:  (!) 159/103  Pulse:  64  Resp:  16  Temp: 97.7 F (36.5 C) 98.2 F (36.8 C)  SpO2:  100%     General: Awake, no distress.  Vomiting. CV:  Good peripheral perfusion.  Heart regular rate and rhythm. Resp:  Normal effort.  Lungs are clear bilaterally. Abd:  No distention.  Soft, nontender, bowel sounds present x 4 quadrants. Other:     ED Results / Procedures / Treatments   Labs (all labs ordered are listed, but only abnormal results are displayed) Labs Reviewed  COMPREHENSIVE METABOLIC PANEL WITH GFR - Abnormal; Notable for the following components:      Result Value   CO2 18  (*)    Glucose, Bld 142 (*)    Total Protein 8.4 (*)    Total Bilirubin 1.3 (*)    Anion gap 16 (*)    All other components within normal limits  CBC - Abnormal; Notable for the following components:   WBC 12.1 (*)    RBC 5.64 (*)    Hemoglobin 15.4 (*)    MCV 79.1 (*)    All other components within normal limits  URINALYSIS, ROUTINE W REFLEX MICROSCOPIC - Abnormal; Notable for the following components:   Color, Urine YELLOW (*)    APPearance CLEAR (*)    Glucose, UA 50 (*)    Hgb urine dipstick SMALL (*)    Ketones, ur 5 (*)    Protein, ur 30 (*)    All other components within normal limits  LIPASE, BLOOD  POC URINE PREG, ED  TROPONIN T, HIGH SENSITIVITY  TROPONIN T, HIGH SENSITIVITY     EKG  Vent rate 63 BPM PR  interval 134 ms QRS duration 88 ms QT/QTcB 450/460 ms P-R-T axes 61 73 25 Sinus rhythm with marked sinus arrhythmia Otherwise normal ECG When compared with ECG of 06-Dec-2022 13:44     PROCEDURES:  Critical Care performed:   Procedures   MEDICATIONS ORDERED IN ED: Medications  ondansetron  (ZOFRAN -ODT) disintegrating tablet 4 mg (4 mg Oral Given 04/21/24 1121)  sodium chloride  0.9 % bolus 1,000 mL (1,000 mLs Intravenous New Bag/Given 04/21/24 1224)  ondansetron  (ZOFRAN ) injection 4 mg (4 mg Intravenous Given 04/21/24 1222)     IMPRESSION / MDM / ASSESSMENT AND PLAN / ED COURSE  I reviewed the triage vital signs and the nursing notes.   Differential diagnosis includes, but is not limited to, alcoholic gastritis, vomiting, viral illness, elevated blood pressure, hypertension under poor control.  28 year old female presents to the ED with complaint of vomiting after drinking different kinds of alcohol last evening.  Patient was given IV Zofran  and liter of fluids and was able to tolerate a popsicle prior to discharge.  She states that her blood pressure at the urgent care was normal and that she does not take blood pressure medication.  I suggested that she needs  to follow-up with her PCP as it was elevated in the emergency department and she does have a history of hypertension.  She has remained on clear liquids today and gradually add back food as tolerated.  A prescription for Zofran  and Pepcid  AC was sent to the pharmacy for her to begin taking.      Patient's presentation is most consistent with acute complicated illness / injury requiring diagnostic workup.  FINAL CLINICAL IMPRESSION(S) / ED DIAGNOSES   Final diagnoses:  Acute alcoholic gastritis without hemorrhage  Elevated blood pressure reading     Rx / DC Orders   ED Discharge Orders          Ordered    ondansetron  (ZOFRAN -ODT) 4 MG disintegrating tablet  Every 8 hours PRN        04/21/24 1516    famotidine  (PEPCID ) 10 MG tablet  2 times daily        04/21/24 1516             Note:  This document was prepared using Dragon voice recognition software and may include unintentional dictation errors.   Saunders Shona CROME, PA-C 04/21/24 1522    Rexford Reche HERO, MD 04/21/24 [phone]  "

## 2024-04-21 NOTE — ED Notes (Signed)
 ED PA stated second troponin did not need to be drawn

## 2024-04-27 ENCOUNTER — Encounter: Payer: Self-pay | Admitting: Emergency Medicine

## 2024-04-27 ENCOUNTER — Other Ambulatory Visit: Payer: Self-pay

## 2024-04-27 ENCOUNTER — Emergency Department
Admission: EM | Admit: 2024-04-27 | Discharge: 2024-04-27 | Disposition: A | Discharge status: Home / Self Care | Attending: Emergency Medicine | Admitting: Emergency Medicine

## 2024-04-27 ENCOUNTER — Emergency Department

## 2024-04-27 DIAGNOSIS — I10 Essential (primary) hypertension: Secondary | ICD-10-CM | POA: Insufficient documentation

## 2024-04-27 DIAGNOSIS — R030 Elevated blood-pressure reading, without diagnosis of hypertension: Secondary | ICD-10-CM

## 2024-04-27 DIAGNOSIS — R1030 Lower abdominal pain, unspecified: Secondary | ICD-10-CM

## 2024-04-27 DIAGNOSIS — R1031 Right lower quadrant pain: Secondary | ICD-10-CM | POA: Insufficient documentation

## 2024-04-27 DIAGNOSIS — R112 Nausea with vomiting, unspecified: Secondary | ICD-10-CM | POA: Diagnosis not present

## 2024-04-27 DIAGNOSIS — R824 Acetonuria: Secondary | ICD-10-CM | POA: Diagnosis not present

## 2024-04-27 DIAGNOSIS — R1032 Left lower quadrant pain: Secondary | ICD-10-CM | POA: Insufficient documentation

## 2024-04-27 DIAGNOSIS — R197 Diarrhea, unspecified: Secondary | ICD-10-CM | POA: Insufficient documentation

## 2024-04-27 DIAGNOSIS — D72819 Decreased white blood cell count, unspecified: Secondary | ICD-10-CM | POA: Insufficient documentation

## 2024-04-27 LAB — URINALYSIS, ROUTINE W REFLEX MICROSCOPIC
Bilirubin Urine: NEGATIVE
Glucose, UA: NEGATIVE mg/dL
Hgb urine dipstick: NEGATIVE
Ketones, ur: 20 mg/dL — AB
Leukocytes,Ua: NEGATIVE
Nitrite: NEGATIVE
Protein, ur: NEGATIVE mg/dL
Specific Gravity, Urine: 1.014 (ref 1.005–1.030)
pH: 5 (ref 5.0–8.0)

## 2024-04-27 LAB — COMPREHENSIVE METABOLIC PANEL WITH GFR
ALT: 16 U/L (ref 0–44)
AST: 32 U/L (ref 15–41)
Albumin: 4.7 g/dL (ref 3.5–5.0)
Alkaline Phosphatase: 61 U/L (ref 38–126)
Anion gap: 15 (ref 5–15)
BUN: 8 mg/dL (ref 6–20)
CO2: 24 mmol/L (ref 22–32)
Calcium: 9.6 mg/dL (ref 8.9–10.3)
Chloride: 102 mmol/L (ref 98–111)
Creatinine, Ser: 0.92 mg/dL (ref 0.44–1.00)
GFR, Estimated: >60 mL/min
Glucose, Bld: 87 mg/dL (ref 70–99)
Potassium: 3.8 mmol/L (ref 3.5–5.1)
Sodium: 141 mmol/L (ref 135–145)
Total Bilirubin: 0.9 mg/dL (ref 0.0–1.2)
Total Protein: 8.1 g/dL (ref 6.5–8.1)

## 2024-04-27 LAB — CBC
HCT: 48.2 % — ABNORMAL HIGH (ref 36.0–46.0)
Hemoglobin: 16 g/dL — ABNORMAL HIGH (ref 12.0–15.0)
MCH: 26.7 pg (ref 26.0–34.0)
MCHC: 33.2 g/dL (ref 30.0–36.0)
MCV: 80.5 fL (ref 80.0–100.0)
Platelets: 164 K/uL (ref 150–400)
RBC: 5.99 MIL/uL — ABNORMAL HIGH (ref 3.87–5.11)
RDW: 12.4 % (ref 11.5–15.5)
WBC: 3.2 K/uL — ABNORMAL LOW (ref 4.0–10.5)
nRBC: 0 % (ref 0.0–0.2)

## 2024-04-27 LAB — POC URINE PREG, ED: Preg Test, Ur: NEGATIVE

## 2024-04-27 LAB — LIPASE, BLOOD: Lipase: 24 U/L (ref 11–51)

## 2024-04-27 MED ORDER — KETOROLAC TROMETHAMINE 15 MG/ML IJ SOLN
15.0000 mg | Freq: Once | INTRAMUSCULAR | Status: AC
  Administered 2024-04-27: 15 mg via INTRAVENOUS
  Filled 2024-04-27: qty 1

## 2024-04-27 MED ORDER — SODIUM CHLORIDE 0.9 % IV BOLUS
1000.0000 mL | Freq: Once | INTRAVENOUS | Status: AC
  Administered 2024-04-27: 1000 mL via INTRAVENOUS

## 2024-04-27 MED ORDER — IOHEXOL 300 MG/ML  SOLN
100.0000 mL | Freq: Once | INTRAMUSCULAR | Status: AC | PRN
  Administered 2024-04-27: 100 mL via INTRAVENOUS

## 2024-04-27 NOTE — ED Notes (Signed)
 Constant diarrhea and abdominal cramping since drinking a lot on New Years day.

## 2024-04-27 NOTE — ED Provider Notes (Signed)
 "   [facility] Emergency Department Provider Note     Event Date/Time   First MD Initiated Contact with Patient 04/27/24 1006     (approximate)   History   Abdominal Pain   HPI  Kari Morales is a 29 y.o. female with a past medical history of PCOS, HTN and migraines presents to the ED for evaluation of lower abdominal pain described as cramping.  Patient reports drinking alcohol over [place] eve with her family when her abdominal pain initially began, she was evaluated in the ED and treated with Zofran  and antacids.  She returns today reporting unchanged abdominal pain since and unable to keep solid foods down.  She reports losing 10 to 13 pounds since New Year's Eve.  Associated symptoms include diarrhea over the past 7 days described as orange in color.  She denies any fever, urinary symptoms.  LMP went off yesterday.  Chart reviewed -ED visit seen for acute alcoholic gastritis without hemorrhage, full workup performed, given fluids and Zofran .      Physical Exam   Triage Vital Signs: ED Triage Vitals  Encounter Vitals Group     BP 04/27/24 0956 (!) 144/110     Girls Systolic BP Percentile --      Girls Diastolic BP Percentile --      Boys Systolic BP Percentile --      Boys Diastolic BP Percentile --      Pulse Rate 04/27/24 0956 (!) 106     Resp 04/27/24 0956 17     Temp 04/27/24 0956 97.6 F (36.4 C)     Temp Source 04/27/24 0956 Oral     SpO2 04/27/24 0956 100 %     Weight --      Height --      Head Circumference --      Peak Flow --      Pain Score 04/27/24 0954 10     Pain Loc --      Pain Education --      Exclude from Growth Chart --     Most recent vital signs: Vitals:   04/27/24 0956 04/27/24 1324  BP: (!) 144/110 (!) 143/113  Pulse: (!) 106 91  Resp: 17 16  Temp: 97.6 F (36.4 C)   SpO2: 100% 100%    General Awake, no distress.  HEENT NCAT.  CV:  Good peripheral perfusion.  RESP:  Normal effort.  ABD:  No  distention.  Other:  Soft, mildly tender to bilateral lower abdominal pain with palpation.  Negative CVA tenderness.  No masses lumps or organomegaly noted.   ED Results / Procedures / Treatments   Labs (all labs ordered are listed, but only abnormal results are displayed) Labs Reviewed  CBC - Abnormal; Notable for the following components:      Result Value   WBC 3.2 (*)    RBC 5.99 (*)    Hemoglobin 16.0 (*)    HCT 48.2 (*)    All other components within normal limits  URINALYSIS, ROUTINE W REFLEX MICROSCOPIC - Abnormal; Notable for the following components:   Color, Urine YELLOW (*)    APPearance HAZY (*)    Ketones, ur 20 (*)    All other components within normal limits  LIPASE, BLOOD  COMPREHENSIVE METABOLIC PANEL WITH GFR  POC URINE PREG, ED   RADIOLOGY  I personally viewed and evaluated these images as part of my medical decision making, as well as reviewing the written report  by the radiologist.  CT ABDOMEN PELVIS W CONTRAST Result Date: 04/27/2024 CLINICAL DATA:  Acute generalized abdominal pain. EXAM: CT ABDOMEN AND PELVIS WITH CONTRAST TECHNIQUE: Multidetector CT imaging of the abdomen and pelvis was performed using the standard protocol following bolus administration of intravenous contrast. RADIATION DOSE REDUCTION: This exam was performed according to the departmental dose-optimization program which includes automated exposure control, adjustment of the mA and/or kV according to patient size and/or use of iterative reconstruction technique. CONTRAST:  OMNIPAQUE  IOHEXOL  300 MG/ML  SOLN COMPARISON:  July 24, 2011. FINDINGS: Lower chest: No acute abnormality. Hepatobiliary: No focal liver abnormality is seen. No gallstones, gallbladder wall thickening, or biliary dilatation. Pancreas: Unremarkable. No pancreatic ductal dilatation or surrounding inflammatory changes. Spleen: Normal in size without focal abnormality. Adrenals/Urinary Tract: Adrenal glands are unremarkable.  Kidneys are normal, without renal calculi, focal lesion, or hydronephrosis. Bladder is unremarkable. Stomach/Bowel: Stomach is within normal limits. Appendix appears normal. No evidence of bowel wall thickening, distention, or inflammatory changes. Vascular/Lymphatic: No significant vascular findings are present. No enlarged abdominal or pelvic lymph nodes. Reproductive: Uterus and left adnexa are unremarkable. 2 complex but fat containing rounded lesions are noted in right adnexal region, largest measuring 3 cm. The other contains calcification. 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 Other: No abdominal wall hernia or abnormality. No abdominopelvic ascites. Musculoskeletal: No acute or significant osseous findings. IMPRESSION: Two complex but fat containing rounded lesions are noted in right adnexal region, largest measuring 3 cm. The other contains calcification. 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 Consultation with gynecology is recommended. Electronically Signed   By: Lynwood Landy Raddle M.D.   On: 04/27/2024 12:12    PROCEDURES:  Critical Care performed: No  Procedures   MEDICATIONS ORDERED IN ED: Medications  ketorolac  (TORADOL ) 15 MG/ML injection 15 mg (15 mg Intravenous Given 04/27/24 1116)  sodium chloride  0.9 % bolus 1,000 mL (0 mLs Intravenous Stopped 04/27/24 1330)  iohexol  (OMNIPAQUE ) 300 MG/ML solution 100 mL (100 mLs Intravenous Contrast Given 04/27/24 1159)     IMPRESSION / MDM / ASSESSMENT AND PLAN / ED COURSE  I reviewed the triage vital signs and the nursing notes.                             Clinical Course as of 04/27/24 1431  Wed Apr 27, 2024  1231 CT ABDOMEN PELVIS W CONTRAST IMPRESSION: Two complex but fat containing rounded lesions are noted in right adnexal region, largest measuring 3 cm. The other contains calcification. 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 Consultation with  gynecology is recommended.   [MH]    Clinical Course User Index [MH] [name], PA-C   29 y.o. female presents to the emergency department for evaluation and treatment of lower abdominal pain. See HPI for further details.   Differential diagnosis includes, but is not limited to, ovarian cyst, acute appendicitis, diverticulitis, urinary tract infection/pyelonephritis, bowel obstruction, colitis, renal colic, gastroenteritis, hernia, fibroids, endometriosis, pregnancy related pain including ectopic pregnancy, etc.    Patient's presentation is most consistent with acute complicated illness / injury requiring diagnostic workup.  Patient presents with generalized lower abdominal pain x 1 week with associated symptoms of nausea, vomiting and diarrhea.  She contributes her initial symptoms after having drinks at a New Year's Eve celebration party.  On physical exam there is mild tenderness to bilateral lower abdomen region  without guarding.  Lab work shows WBC 3.2 otherwise reassuring.  Normal CMP, lipase.  Ketones in urinalysis.  We treated her with IV fluids and Toradol  for pain.  CT abdomen pelvis shows 2 complex but fat-containing well lesions in right adnexal region consistent with dermatoid tumor versus teratoma, otherwise reassuring CT scan.  I have low suspicion that this is contributing to patient's presentation today.  Will have her follow-up with OB/GYN for further evaluation.  We also discussed elevated blood pressure. This appears to be patients trend. She does not take BP medication. I advised her to follow up with her PCP to discuss possible medication management. She agreed to this care plan.  She is currently in stable condition for discharge home.  ED return precautions discussed. All questions and concerns were addressed during this ED visit.     FINAL CLINICAL IMPRESSION(S) / ED DIAGNOSES   Final diagnoses:  Lower abdominal pain   Rx / DC Orders   ED Discharge Orders      None        Note:  This document was prepared using Dragon voice recognition software and may include unintentional dictation errors.    Margrette, Kobe Jansma A, PA-C 04/27/24 1443    Dorothyann Drivers, MD 04/27/24 1455  "

## 2024-04-27 NOTE — Discharge Instructions (Addendum)
 Your evaluated in the ED for lower abdominal pain.  Your lab work is overall reassuring.  Your CT abdomen and pelvis reveal Two complex but fat containing rounded lesions are noted in right adnexal region, largest measuring 3 cm. The other contains calcification.  We would like for you to follow-up with OB/GYN for further evaluation.  Please call and schedule appointment with [facility] tomorrow.

## 2024-04-27 NOTE — ED Triage Notes (Signed)
 Pt reports abd pain that has been on going since being seen on New Years day. Pt reports she has lost 13 pounds and has been unable to eat or drink anything.

## 2024-05-09 NOTE — Progress Notes (Addendum)
" ° ° °  GYNECOLOGY PROGRESS NOTE  Subjective:    Patient ID: Kari Morales, female    DOB: 01-21-1996, 29 y.o.   MRN: [id number]  HPI  Patient is a 29 y.o. [id number] female who presents for hospital follow up. She was evaluated at ED on 04/27/24 for lower abdominal pain. At that time she presented with lower abdominal pain described as cramping. CT scan of the abdomen was obtained and it revealed some cyst on her ovary, with the largest one measuring up to 3 cm.  {Common ambulatory SmartLinks:19316}  Review of Systems {ros; complete:30496}   Objective:   Last menstrual period 04/19/2024. There is no height or weight on file to calculate BMI. General appearance: {general exam:16600} Abdomen: {abdominal exam:16834} Pelvic: {pelvic exam:16852::cervix normal in appearance,external genitalia normal,no adnexal masses or tenderness,no cervical motion tenderness,rectovaginal septum normal,uterus normal size, shape, and consistency,vagina normal without discharge} Extremities: {extremity exam:5109} Neurologic: {neuro exam:17854}   Assessment:   No diagnosis found.   Plan:   There are no diagnoses linked to this encounter.    Damien Parsley, CNM [facility]

## 2024-05-10 ENCOUNTER — Ambulatory Visit: Admitting: Certified Nurse Midwife

## 2024-05-10 VITALS — BP 147/104 | HR 87 | Resp 16 | Ht 64.0 in | Wt 171.6 lb

## 2024-05-10 DIAGNOSIS — I10 Essential (primary) hypertension: Secondary | ICD-10-CM

## 2024-05-10 MED ORDER — NIFEDIPINE ER OSMOTIC RELEASE 30 MG PO TB24: 30.0000 mg | ORAL_TABLET | Freq: Every day | ORAL | 2 refills | Status: AC

## 2024-05-12 ENCOUNTER — Encounter: Payer: Self-pay | Admitting: Pharmacy Technician

## 2024-05-12 ENCOUNTER — Other Ambulatory Visit (HOSPITAL_COMMUNITY): Payer: Self-pay

## 2024-05-12 NOTE — Telephone Encounter (Signed)
 error

## 2024-05-13 ENCOUNTER — Telehealth: Payer: Self-pay | Admitting: Pharmacy Technician

## 2024-05-13 NOTE — Telephone Encounter (Signed)
 Pharmacy Patient Advocate Encounter   Received notification from CC'd chart that prior authorization for NIFEdipine  ER Osmotic Release 30MG  er tablets  is required/requested.   Insurance verification completed.   The patient is insured through [employer].   Per test claim: PA required; PA submitted to above mentioned insurance via Latent Key/confirmation #/EOC [id number] Status is pending

## 2024-05-13 NOTE — Telephone Encounter (Signed)
 Pharmacy Patient Advocate Encounter  Received notification from [facility] MEDICAID that Prior Authorization for NIFEdipine  ER Osmotic Release 30MG  er tablets has been CANCELLED due to   PA #/Case ID/Reference #: [id number]    I called patient's pharmacy and they advised that they ran the only [facility] number of the drug that they had in stock. They advised that patient already picked up the medication and paid cash $13.99.

## 2024-06-16 ENCOUNTER — Ambulatory Visit: Admitting: Licensed Practical Nurse
